# Patient Record
Sex: Male | Born: 1960 | Race: Black or African American | Hispanic: No | Marital: Single | Smoking: Current some day smoker
Health system: Southern US, Community
[De-identification: ages and names within clinical notes are randomized; demographics above are authoritative.]

## PROBLEM LIST (undated history)

## (undated) DIAGNOSIS — B20 Human immunodeficiency virus [HIV] disease: Secondary | ICD-10-CM

## (undated) DIAGNOSIS — I252 Old myocardial infarction: Secondary | ICD-10-CM

## (undated) DIAGNOSIS — E7800 Pure hypercholesterolemia, unspecified: Secondary | ICD-10-CM

## (undated) DIAGNOSIS — I209 Angina pectoris, unspecified: Secondary | ICD-10-CM

## (undated) DIAGNOSIS — F419 Anxiety disorder, unspecified: Secondary | ICD-10-CM

## (undated) DIAGNOSIS — F32A Depression, unspecified: Secondary | ICD-10-CM

## (undated) DIAGNOSIS — I10 Essential (primary) hypertension: Secondary | ICD-10-CM

## (undated) DIAGNOSIS — L729 Follicular cyst of the skin and subcutaneous tissue, unspecified: Secondary | ICD-10-CM

## (undated) DIAGNOSIS — C801 Malignant (primary) neoplasm, unspecified: Secondary | ICD-10-CM

## (undated) DIAGNOSIS — F329 Major depressive disorder, single episode, unspecified: Secondary | ICD-10-CM

## (undated) HISTORY — PX: OTHER SURGICAL HISTORY: SHX169

---

## 2015-01-27 ENCOUNTER — Encounter (HOSPITAL_COMMUNITY): Payer: Self-pay | Admitting: Emergency Medicine

## 2015-01-27 ENCOUNTER — Emergency Department (HOSPITAL_COMMUNITY)
Admission: EM | Admit: 2015-01-27 | Discharge: 2015-01-28 | Disposition: A | Discharge status: Other Acute Inpatient Hospital | Payer: Medicare Other | Attending: Emergency Medicine | Admitting: Emergency Medicine

## 2015-01-27 DIAGNOSIS — B20 Human immunodeficiency virus [HIV] disease: Secondary | ICD-10-CM | POA: Diagnosis not present

## 2015-01-27 DIAGNOSIS — Z79899 Other long term (current) drug therapy: Secondary | ICD-10-CM | POA: Insufficient documentation

## 2015-01-27 DIAGNOSIS — E876 Hypokalemia: Secondary | ICD-10-CM | POA: Diagnosis not present

## 2015-01-27 DIAGNOSIS — R45851 Suicidal ideations: Secondary | ICD-10-CM

## 2015-01-27 DIAGNOSIS — F1210 Cannabis abuse, uncomplicated: Secondary | ICD-10-CM | POA: Diagnosis not present

## 2015-01-27 DIAGNOSIS — N5089 Other specified disorders of the male genital organs: Secondary | ICD-10-CM | POA: Insufficient documentation

## 2015-01-27 DIAGNOSIS — F329 Major depressive disorder, single episode, unspecified: Secondary | ICD-10-CM | POA: Insufficient documentation

## 2015-01-27 DIAGNOSIS — F1410 Cocaine abuse, uncomplicated: Secondary | ICD-10-CM | POA: Diagnosis not present

## 2015-01-27 DIAGNOSIS — Z859 Personal history of malignant neoplasm, unspecified: Secondary | ICD-10-CM | POA: Insufficient documentation

## 2015-01-27 DIAGNOSIS — Z872 Personal history of diseases of the skin and subcutaneous tissue: Secondary | ICD-10-CM | POA: Diagnosis not present

## 2015-01-27 DIAGNOSIS — I10 Essential (primary) hypertension: Secondary | ICD-10-CM | POA: Insufficient documentation

## 2015-01-27 DIAGNOSIS — F32A Depression, unspecified: Secondary | ICD-10-CM

## 2015-01-27 DIAGNOSIS — F332 Major depressive disorder, recurrent severe without psychotic features: Secondary | ICD-10-CM | POA: Diagnosis present

## 2015-01-27 HISTORY — DX: Angina pectoris, unspecified: I20.9

## 2015-01-27 HISTORY — DX: Human immunodeficiency virus (HIV) disease: B20

## 2015-01-27 HISTORY — DX: Essential (primary) hypertension: I10

## 2015-01-27 HISTORY — DX: Follicular cyst of the skin and subcutaneous tissue, unspecified: L72.9

## 2015-01-27 HISTORY — DX: Malignant (primary) neoplasm, unspecified: C80.1

## 2015-01-27 LAB — COMPREHENSIVE METABOLIC PANEL
ALT: 13 U/L — AB (ref 17–63)
AST: 26 U/L (ref 15–41)
Albumin: 4.1 g/dL (ref 3.5–5.0)
Alkaline Phosphatase: 114 U/L (ref 38–126)
Anion gap: 6 (ref 5–15)
BUN: 25 mg/dL — AB (ref 6–20)
CHLORIDE: 107 mmol/L (ref 101–111)
CO2: 25 mmol/L (ref 22–32)
CREATININE: 1.13 mg/dL (ref 0.61–1.24)
Calcium: 8.9 mg/dL (ref 8.9–10.3)
GFR calc Af Amer: >60 mL/min (ref >60)
Glucose, Bld: 155 mg/dL — ABNORMAL HIGH (ref 65–99)
POTASSIUM: 3.1 mmol/L — AB (ref 3.5–5.1)
SODIUM: 138 mmol/L (ref 135–145)
Total Bilirubin: 0.5 mg/dL (ref 0.3–1.2)
Total Protein: 7.5 g/dL (ref 6.5–8.1)

## 2015-01-27 LAB — CBC
HCT: 41.4 % (ref 39.0–52.0)
HEMOGLOBIN: 14.2 g/dL (ref 13.0–17.0)
MCH: 33.4 pg (ref 26.0–34.0)
MCHC: 34.3 g/dL (ref 30.0–36.0)
MCV: 97.4 fL (ref 78.0–100.0)
PLATELETS: 175 10*3/uL (ref 150–400)
RBC: 4.25 MIL/uL (ref 4.22–5.81)
RDW: 11.7 % (ref 11.5–15.5)
WBC: 6.8 10*3/uL (ref 4.0–10.5)

## 2015-01-27 LAB — ETHANOL

## 2015-01-27 LAB — ACETAMINOPHEN LEVEL: Acetaminophen (Tylenol), Serum: <10 ug/mL — ABNORMAL LOW (ref 10–30)

## 2015-01-27 LAB — SALICYLATE LEVEL

## 2015-01-27 MED ORDER — IBUPROFEN 200 MG PO TABS
600.0000 mg | ORAL_TABLET | Freq: Three times a day (TID) | ORAL | Status: DC | PRN
Start: 2015-01-27 — End: 2015-01-28

## 2015-01-27 MED ORDER — ALUM & MAG HYDROXIDE-SIMETH 200-200-20 MG/5ML PO SUSP: 30.0000 mL | ORAL | Status: DC | PRN

## 2015-01-27 MED ORDER — LISINOPRIL 10 MG PO TABS
10.0000 mg | ORAL_TABLET | Freq: Every day | ORAL | Status: DC
Start: 2015-01-28 — End: 2015-01-28
  Administered 2015-01-28: 10 mg via ORAL
  Filled 2015-01-27 (×2): qty 1

## 2015-01-27 MED ORDER — ACETAMINOPHEN 325 MG PO TABS: 650.0000 mg | ORAL_TABLET | ORAL | Status: DC | PRN

## 2015-01-27 MED ORDER — AMLODIPINE BESYLATE 10 MG PO TABS
10.0000 mg | ORAL_TABLET | Freq: Every day | ORAL | Status: DC
  Administered 2015-01-28: 10 mg via ORAL
  Filled 2015-01-27 (×2): qty 1

## 2015-01-27 MED ORDER — NITROGLYCERIN 0.4 MG SL SUBL: 0.4000 mg | SUBLINGUAL_TABLET | SUBLINGUAL | Status: DC | PRN

## 2015-01-27 MED ORDER — NICOTINE 21 MG/24HR TD PT24
21.0000 mg | MEDICATED_PATCH | Freq: Every day | TRANSDERMAL | Status: DC
  Filled 2015-01-27: qty 1

## 2015-01-27 MED ORDER — ROSUVASTATIN CALCIUM 20 MG PO TABS
20.0000 mg | ORAL_TABLET | Freq: Every day | ORAL | Status: DC
  Filled 2015-01-27 (×2): qty 1

## 2015-01-27 MED ORDER — LORAZEPAM 1 MG PO TABS: 1.0000 mg | ORAL_TABLET | Freq: Three times a day (TID) | ORAL | Status: DC | PRN

## 2015-01-27 MED ORDER — CARVEDILOL 25 MG PO TABS
25.0000 mg | ORAL_TABLET | Freq: Two times a day (BID) | ORAL | Status: DC
  Administered 2015-01-28: 25 mg via ORAL
  Filled 2015-01-27 (×5): qty 1

## 2015-01-27 MED ORDER — EFAVIRENZ-EMTRICITAB-TENOFOVIR 600-200-300 MG PO TABS
1.0000 | ORAL_TABLET | Freq: Every day | ORAL | Status: DC
  Administered 2015-01-27: 1 via ORAL
  Filled 2015-01-27 (×3): qty 1

## 2015-01-27 MED ORDER — ZOLPIDEM TARTRATE 5 MG PO TABS: 5.0000 mg | ORAL_TABLET | Freq: Every evening | ORAL | Status: DC | PRN

## 2015-01-27 MED ORDER — ONDANSETRON HCL 4 MG PO TABS: 4.0000 mg | ORAL_TABLET | Freq: Three times a day (TID) | ORAL | Status: DC | PRN

## 2015-01-27 MED ORDER — PANTOPRAZOLE SODIUM 40 MG PO TBEC
40.0000 mg | DELAYED_RELEASE_TABLET | Freq: Every day | ORAL | Status: DC
  Administered 2015-01-28: 40 mg via ORAL
  Filled 2015-01-27: qty 1

## 2015-01-27 NOTE — ED Notes (Signed)
Pt states he is feeling useless and feels like his life is not worth very much  Pt states he feels like life is just too much right now and is making him feel crazy  Pt states he has hx of depression but has not had to be on any medications in a long time

## 2015-01-27 NOTE — ED Provider Notes (Signed)
CSN: [id number]     Arrival date & time 01/27/15  2151 History   First MD Initiated Contact with Patient 01/27/15 2238     Chief Complaint  Patient presents with  . Suicidal     (Consider location/radiation/quality/duration/timing/severity/associated sxs/prior Treatment) HPI Comments: Nathan Santana is a 54 y.o. male with a PMHx of depression, HIV, scrotal cysts s/p excision, HTN, and angina pectoris, who presents to the ED with complaints of anhedonia, depression, and suicidal ideations 1 week. Patient states that he has been feeling like there is nothing left to live for, but he has no specific plan in mind for suicide. He states that today he went to a urology appointment at [facility] and was told there was nothing more to do about his scrotal cyst which isn't causing him discomfort for the last 1 month. He reports the scrotal pain is chronic and unchanged, and the swelling in the cyst is unchanged. After his urology appointment, he states that he was so upset due to the fact that they were not going to do anything for his condition, that he became more suicidal and sought attention by coming to the ER today. He is here voluntarily.  He denies any HI/AVH, or suicidal plan. He denies any illicit drug use, alcohol use, or having a therapist. He is no longer on any psychiatric medications, but previously was on Paxil. He admits to smoking 1 cigarette per day. He denies any other medical complaints aside from the chronic scrotal pain and swelling for which she was are evaluated by a urologist today.  Patient is a 54 y.o. male presenting with mental health disorder. The history is provided by the patient. No language interpreter was used.  Mental Health Problem Presenting symptoms: suicidal thoughts   Presenting symptoms: no hallucinations and no homicidal ideas   Onset quality:  Gradual Duration:  1 week Timing:  Constant Progression:  Unchanged Chronicity:  Recurrent Context: stressful life  event   Treatment compliance:  Untreated Relieved by:  None tried Exacerbated by: stress. Ineffective treatments:  None tried Associated symptoms: anhedonia   Associated symptoms: no abdominal pain and no chest pain   Risk factors: hx of mental illness     Past Medical History  Diagnosis Date  . HIV infection ([place])   . Cancer ([place])   . Hypertension   . Angina pectoris ([place])   . Scrotal cyst    Past Surgical History  Procedure Laterality Date  . Scrotal turmor removed     Family History  Problem Relation Age of Onset  . Hypertension Other    Social History  Substance Use Topics  . Smoking status: Never Smoker   . Smokeless tobacco: None  . Alcohol Use: No    Review of Systems  Constitutional: Negative for fever and chills.  Respiratory: Negative for shortness of breath.   Cardiovascular: Negative for chest pain.  Gastrointestinal: Negative for nausea, vomiting, abdominal pain, diarrhea and constipation.  Genitourinary: Positive for scrotal swelling (chronic pain/swelling at site of his scrotal cyst, seen by urologist today). Negative for dysuria and hematuria.  Musculoskeletal: Negative for myalgias and arthralgias.  Skin: Negative for color change.  Allergic/Immunologic: Positive for immunocompromised state (HIV).  Neurological: Negative for weakness and numbness.  Psychiatric/Behavioral: Positive for suicidal ideas. Negative for homicidal ideas, hallucinations and confusion.   10 Systems reviewed and are negative for acute change except as noted in the HPI.    Allergies  Review of patient's allergies indicates no known allergies.  Home Medications   Prior to Admission medications   Medication Sig Start Date End Date Taking? Authorizing Provider  amLODipine (NORVASC) 10 MG tablet Take 10 mg by mouth daily.  01/11/15  Yes Historical Provider, MD  ATRIPLA 600-200-300 MG tablet TK 1 T PO HS 01/11/15  Yes Historical Provider, MD  carvedilol (COREG) 25 MG tablet  Take 25 mg by mouth 2 (two) times daily with a meal.  01/11/15  Yes Historical Provider, MD  ibuprofen (ADVIL,MOTRIN) 800 MG tablet take 1 tablet by mouth every 8 hours if needed for pain 12/29/14  Yes Historical Provider, MD  lisinopril (PRINIVIL,ZESTRIL) 10 MG tablet Take 10 mg by mouth daily. 12/17/14  Yes Historical Provider, MD  pantoprazole (PROTONIX) 40 MG tablet Take 40 mg by mouth daily. 12/17/14  Yes Historical Provider, MD  rosuvastatin (CRESTOR) 20 MG tablet Take 20 mg by mouth daily.  01/11/15  Yes Historical Provider, MD  NITROSTAT 0.4 MG SL tablet Place 0.4 mg under the tongue every 5 (five) minutes as needed for chest pain.  01/11/15   Historical Provider, MD   BP 125/75 mmHg  Pulse 68  Temp(Src) 98.3 F (36.8 C) (Oral)  Resp 17  SpO2 99% Physical Exam  Constitutional: He is oriented to person, place, and time. Vital signs are normal. He appears well-developed and well-nourished.  Non-toxic appearance. No distress.  Afebrile, nontoxic, NAD  HENT:  Head: Normocephalic and atraumatic.  Mouth/Throat: Oropharynx is clear and moist and mucous membranes are normal.  Eyes: Conjunctivae and EOM are normal. Right eye exhibits no discharge. Left eye exhibits no discharge.  Neck: Normal range of motion. Neck supple.  Cardiovascular: Normal rate, regular rhythm, normal heart sounds and intact distal pulses.  Exam reveals no gallop and no friction rub.   No murmur heard. Pulmonary/Chest: Effort normal and breath sounds normal. No respiratory distress. He has no decreased breath sounds. He has no wheezes. He has no rhonchi. He has no rales.  Abdominal: Soft. Normal appearance and bowel sounds are normal. He exhibits no distension. There is no tenderness. There is no rigidity, no rebound, no guarding, no CVA tenderness, no tenderness at McBurney's point and negative Murphy's sign.  Musculoskeletal: Normal range of motion.  Neurological: He is alert and oriented to person, place, and time. He  has normal strength. No sensory deficit.  Skin: Skin is warm, dry and intact. No rash noted.  Psychiatric: He is not actively hallucinating. He exhibits a depressed mood. He expresses suicidal ideation. He expresses no homicidal ideation. He expresses no suicidal plans and no homicidal plans.  Depressed affect. Endorses SI without a specific plan. No HI/AVH  Nursing note and vitals reviewed.   ED Course  Procedures (including critical care time) Labs Review Labs Reviewed  COMPREHENSIVE METABOLIC PANEL - Abnormal; Notable for the following:    Potassium 3.1 (*)    Glucose, Bld 155 (*)    BUN 25 (*)    ALT 13 (*)    All other components within normal limits  ACETAMINOPHEN LEVEL - Abnormal; Notable for the following:    Acetaminophen (Tylenol), Serum <10 (*)    All other components within normal limits  ETHANOL  SALICYLATE LEVEL  CBC  URINE RAPID DRUG SCREEN, HOSP PERFORMED    Imaging Review No results found. I have personally reviewed and evaluated these images and lab results as part of my medical decision-making.   EKG Interpretation None      MDM   Final diagnoses:  Suicidal ideations  Depression  Hypokalemia    54 y.o. male here with suicidal ideations without a specific plan. He feels depressed and has had anhedonia for the last week. No HI/AVH. Reports chronic scrotal pain and a cyst that he has had, pain has been ongoing for more than a month, was seen by his urologist today at [facility] was told there is nothing more to do which he reports upset him so much that he felt much more suicidal and that is why he sought psychiatric attention. Will get screening labs, then likely TTS consult. Pt here voluntarily. Will reassess after labs.   12:08 AM CMP with mild hypokalemia, will give kdur now. EtOH neg. Salicylate and acetaminophen WNL. CBC unremarkable. UDS pending but doubt this will change his med clearance. Will consult TTS and place in psych hold. Meds reordered.  Please see [facility] notes for further documentation of care  BP 125/75 mmHg  Pulse 68  Temp(Src) 98.3 F (36.8 C) (Oral)  Resp 17  SpO2 99%  Meds ordered this encounter  Medications  . ATRIPLA 600-200-300 MG tablet    Sig: TK 1 T PO HS    Refill:  1  . ibuprofen (ADVIL,MOTRIN) 800 MG tablet    Sig: take 1 tablet by mouth every 8 hours if needed for pain    Refill:  0  . carvedilol (COREG) 25 MG tablet    Sig: Take 25 mg by mouth 2 (two) times daily with a meal.   . lisinopril (PRINIVIL,ZESTRIL) 10 MG tablet    Sig: Take 10 mg by mouth daily.    Refill:  0  . pantoprazole (PROTONIX) 40 MG tablet    Sig: Take 40 mg by mouth daily.    Refill:  0  . NITROSTAT 0.4 MG SL tablet    Sig: Place 0.4 mg under the tongue every 5 (five) minutes as needed for chest pain.   . rosuvastatin (CRESTOR) 20 MG tablet    Sig: Take 20 mg by mouth daily.   Marland Kitchen amLODipine (NORVASC) 10 MG tablet    Sig: Take 10 mg by mouth daily.   Marland Kitchen amLODipine (NORVASC) tablet 10 mg    Sig:   . efavirenz-emtricitabine-tenofovir (ATRIPLA) 600-200-300 MG per tablet 1 tablet    Sig:   . carvedilol (COREG) tablet 25 mg    Sig:   . lisinopril (PRINIVIL,ZESTRIL) tablet 10 mg    Sig:   . nitroGLYCERIN (NITROSTAT) SL tablet 0.4 mg    Sig:   . pantoprazole (PROTONIX) EC tablet 40 mg    Sig:   . rosuvastatin (CRESTOR) tablet 20 mg    Sig:   . alum & mag hydroxide-simeth (MAALOX/MYLANTA) 701-779-39 MG/5ML suspension 30 mL    Sig:   . ondansetron (ZOFRAN) tablet 4 mg    Sig:   . nicotine (NICODERM CQ - dosed in mg/24 hours) patch 21 mg    Sig:   . zolpidem (AMBIEN) tablet 5 mg    Sig:   . ibuprofen (ADVIL,MOTRIN) tablet 600 mg    Sig:   . acetaminophen (TYLENOL) tablet 650 mg    Sig:   . LORazepam (ATIVAN) tablet 1 mg    Sig:   . potassium chloride SA (K-DUR,KLOR-CON) CR tablet 40 mEq    Sig:      Eligah Anello Camprubi-Soms, PA-C 01/28/15 0010  Gareth Morgan, MD 01/28/15 1723

## 2015-01-27 NOTE — ED Notes (Signed)
PA at bedside.

## 2015-01-28 ENCOUNTER — Inpatient Hospital Stay (HOSPITAL_COMMUNITY)
Admission: AD | Admit: 2015-01-28 | Discharge: 2015-02-05 | DRG: 885 | Disposition: A | Discharge status: Home / Self Care | Payer: Medicare Other | Source: Intra-hospital | Attending: Emergency Medicine | Admitting: Emergency Medicine

## 2015-01-28 ENCOUNTER — Encounter (HOSPITAL_COMMUNITY): Payer: Self-pay | Admitting: *Deleted

## 2015-01-28 DIAGNOSIS — F332 Major depressive disorder, recurrent severe without psychotic features: Secondary | ICD-10-CM | POA: Diagnosis present

## 2015-01-28 DIAGNOSIS — F329 Major depressive disorder, single episode, unspecified: Secondary | ICD-10-CM | POA: Diagnosis not present

## 2015-01-28 DIAGNOSIS — Z79899 Other long term (current) drug therapy: Secondary | ICD-10-CM | POA: Diagnosis not present

## 2015-01-28 DIAGNOSIS — B20 Human immunodeficiency virus [HIV] disease: Secondary | ICD-10-CM | POA: Diagnosis present

## 2015-01-28 DIAGNOSIS — N50811 Right testicular pain: Secondary | ICD-10-CM | POA: Diagnosis not present

## 2015-01-28 DIAGNOSIS — R079 Chest pain, unspecified: Secondary | ICD-10-CM

## 2015-01-28 DIAGNOSIS — F17290 Nicotine dependence, other tobacco product, uncomplicated: Secondary | ICD-10-CM | POA: Diagnosis present

## 2015-01-28 DIAGNOSIS — R45851 Suicidal ideations: Secondary | ICD-10-CM | POA: Diagnosis present

## 2015-01-28 DIAGNOSIS — I10 Essential (primary) hypertension: Secondary | ICD-10-CM | POA: Diagnosis present

## 2015-01-28 DIAGNOSIS — I4510 Unspecified right bundle-branch block: Secondary | ICD-10-CM | POA: Diagnosis not present

## 2015-01-28 DIAGNOSIS — Z8249 Family history of ischemic heart disease and other diseases of the circulatory system: Secondary | ICD-10-CM | POA: Diagnosis not present

## 2015-01-28 DIAGNOSIS — Z590 Homelessness: Secondary | ICD-10-CM | POA: Diagnosis not present

## 2015-01-28 DIAGNOSIS — F419 Anxiety disorder, unspecified: Secondary | ICD-10-CM | POA: Diagnosis not present

## 2015-01-28 DIAGNOSIS — Z7982 Long term (current) use of aspirin: Secondary | ICD-10-CM | POA: Diagnosis not present

## 2015-01-28 DIAGNOSIS — Z21 Asymptomatic human immunodeficiency virus [HIV] infection status: Secondary | ICD-10-CM | POA: Diagnosis not present

## 2015-01-28 DIAGNOSIS — I2510 Atherosclerotic heart disease of native coronary artery without angina pectoris: Secondary | ICD-10-CM | POA: Diagnosis not present

## 2015-01-28 DIAGNOSIS — I25119 Atherosclerotic heart disease of native coronary artery with unspecified angina pectoris: Secondary | ICD-10-CM | POA: Diagnosis not present

## 2015-01-28 HISTORY — DX: Major depressive disorder, single episode, unspecified: F32.9

## 2015-01-28 HISTORY — DX: Anxiety disorder, unspecified: F41.9

## 2015-01-28 HISTORY — DX: Depression, unspecified: F32.A

## 2015-01-28 LAB — RAPID URINE DRUG SCREEN, HOSP PERFORMED
AMPHETAMINES: NOT DETECTED
BARBITURATES: NOT DETECTED
Benzodiazepines: NOT DETECTED
Cocaine: POSITIVE — AB
Opiates: NOT DETECTED
TETRAHYDROCANNABINOL: POSITIVE — AB

## 2015-01-28 MED ORDER — PAROXETINE HCL 20 MG PO TABS
20.0000 mg | ORAL_TABLET | Freq: Every day | ORAL | Status: DC
  Administered 2015-01-29 – 2015-02-05 (×8): 20 mg via ORAL
  Filled 2015-01-28 (×11): qty 1

## 2015-01-28 MED ORDER — LISINOPRIL 10 MG PO TABS
10.0000 mg | ORAL_TABLET | Freq: Every day | ORAL | Status: DC
  Administered 2015-01-29 – 2015-02-05 (×8): 10 mg via ORAL
  Filled 2015-01-28 (×11): qty 1

## 2015-01-28 MED ORDER — ACETAMINOPHEN 325 MG PO TABS
650.0000 mg | ORAL_TABLET | ORAL | Status: DC | PRN
  Administered 2015-02-01: 650 mg via ORAL
  Filled 2015-01-28: qty 2

## 2015-01-28 MED ORDER — ROSUVASTATIN CALCIUM 20 MG PO TABS
20.0000 mg | ORAL_TABLET | Freq: Every day | ORAL | Status: DC
  Administered 2015-01-29 – 2015-02-04 (×6): 20 mg via ORAL
  Filled 2015-01-28 (×10): qty 1

## 2015-01-28 MED ORDER — NICOTINE 21 MG/24HR TD PT24: 21.0000 mg | MEDICATED_PATCH | Freq: Every day | TRANSDERMAL | Status: DC

## 2015-01-28 MED ORDER — ONDANSETRON HCL 4 MG PO TABS: 4.0000 mg | ORAL_TABLET | Freq: Three times a day (TID) | ORAL | Status: DC | PRN

## 2015-01-28 MED ORDER — EFAVIRENZ-EMTRICITAB-TENOFOVIR 600-200-300 MG PO TABS
1.0000 | ORAL_TABLET | Freq: Every day | ORAL | Status: DC
  Administered 2015-01-28 – 2015-02-04 (×8): 1 via ORAL
  Filled 2015-01-28 (×12): qty 1

## 2015-01-28 MED ORDER — CARVEDILOL 25 MG PO TABS
25.0000 mg | ORAL_TABLET | Freq: Two times a day (BID) | ORAL | Status: DC
  Administered 2015-01-29 – 2015-02-05 (×14): 25 mg via ORAL
  Filled 2015-01-28 (×21): qty 1

## 2015-01-28 MED ORDER — AMLODIPINE BESYLATE 10 MG PO TABS
10.0000 mg | ORAL_TABLET | Freq: Every day | ORAL | Status: DC
  Administered 2015-01-29 – 2015-02-05 (×8): 10 mg via ORAL
  Filled 2015-01-28 (×11): qty 1

## 2015-01-28 MED ORDER — PAROXETINE HCL 20 MG PO TABS
20.0000 mg | ORAL_TABLET | Freq: Every day | ORAL | Status: DC
  Administered 2015-01-28: 20 mg via ORAL
  Filled 2015-01-28 (×3): qty 1

## 2015-01-28 MED ORDER — LORAZEPAM 1 MG PO TABS
1.0000 mg | ORAL_TABLET | Freq: Three times a day (TID) | ORAL | Status: DC | PRN
  Administered 2015-01-29 – 2015-02-04 (×7): 1 mg via ORAL
  Filled 2015-01-28 (×7): qty 1

## 2015-01-28 MED ORDER — ENSURE ENLIVE PO LIQD
237.0000 mL | Freq: Two times a day (BID) | ORAL | Status: DC
  Administered 2015-01-29 (×2): 237 mL via ORAL

## 2015-01-28 MED ORDER — IBUPROFEN 600 MG PO TABS
600.0000 mg | ORAL_TABLET | Freq: Three times a day (TID) | ORAL | Status: DC | PRN
  Administered 2015-01-30: 600 mg via ORAL
  Filled 2015-01-28: qty 1

## 2015-01-28 MED ORDER — POTASSIUM CHLORIDE CRYS ER 20 MEQ PO TBCR
40.0000 meq | EXTENDED_RELEASE_TABLET | Freq: Once | ORAL | Status: AC
  Administered 2015-01-28: 40 meq via ORAL
  Filled 2015-01-28: qty 2

## 2015-01-28 MED ORDER — NICOTINE 21 MG/24HR TD PT24
21.0000 mg | MEDICATED_PATCH | Freq: Every day | TRANSDERMAL | Status: DC
  Filled 2015-01-28 (×3): qty 1

## 2015-01-28 MED ORDER — PANTOPRAZOLE SODIUM 40 MG PO TBEC
40.0000 mg | DELAYED_RELEASE_TABLET | Freq: Every day | ORAL | Status: DC
  Administered 2015-01-29 – 2015-02-05 (×8): 40 mg via ORAL
  Filled 2015-01-28 (×11): qty 1

## 2015-01-28 MED ORDER — ZOLPIDEM TARTRATE 5 MG PO TABS
5.0000 mg | ORAL_TABLET | Freq: Every evening | ORAL | Status: DC | PRN
  Administered 2015-01-28 – 2015-02-04 (×8): 5 mg via ORAL
  Filled 2015-01-28 (×9): qty 1

## 2015-01-28 MED ORDER — ALUM & MAG HYDROXIDE-SIMETH 200-200-20 MG/5ML PO SUSP: 30.0000 mL | ORAL | Status: DC | PRN

## 2015-01-28 MED ORDER — NITROGLYCERIN 0.4 MG SL SUBL
0.4000 mg | SUBLINGUAL_TABLET | SUBLINGUAL | Status: DC | PRN
  Administered 2015-01-31 – 2015-02-04 (×2): 0.4 mg via SUBLINGUAL
  Filled 2015-01-28 (×2): qty 1

## 2015-01-28 NOTE — Progress Notes (Signed)
Pt did not attend the evening karaoke group, since he was new to the unit. Pt remained in his room.

## 2015-01-28 NOTE — ED Notes (Signed)
TTS in room with pt

## 2015-01-28 NOTE — Progress Notes (Signed)
pcp is [facility] [address] [place], [place] [address] [phone]

## 2015-01-28 NOTE — ED Notes (Signed)
Pt oriented to room and unit.  Pt cannot confirm or deny SI at this time however he does contract for safety.
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
## 2015-01-28 NOTE — Progress Notes (Signed)
D   Pt isolated to his room and did not attend groups this evening   He said he was tired and just not feeling like it   He endorses depression and hopelessness   And at this point doesn't know what to do A   Verbal support given   Medications administered and effectiveness monitored    Q 15 min checks   Discussed medications and importance of developing coping skills R   Pt is safe at present and receptive to verbal support

## 2015-01-28 NOTE — Tx Team (Signed)
Initial Interdisciplinary Treatment Plan   PATIENT STRESSORS: Financial difficulties Health problems Legal issue Marital or family conflict Occupational concerns Substance abuse   PATIENT STRENGTHS: Ability for insight Average or above average intelligence Capable of independent living Communication skills General fund of knowledge Motivation for treatment/growth Supportive family/friends   PROBLEM LIST: Problem List/Patient Goals Date to be addressed Date deferred Reason deferred Estimated date of resolution  "suicidal thoughts" 01/28/2015   D/c  "alcohol" 01/28/2015   D/c  "depression" 01/28/2015   D/c  "anxiety" 11/10.2016   D/c  "panic" 01/28/2015   D/c                           DISCHARGE CRITERIA:  Ability to meet basic life and health needs Adequate post-discharge living arrangements Improved stabilization in mood, thinking, and/or behavior Medical problems require only outpatient monitoring Motivation to continue treatment in a less acute level of care Need for constant or close observation no longer present Reduction of life-threatening or endangering symptoms to within safe limits Safe-care adequate arrangements made Verbal commitment to aftercare and medication compliance  PRELIMINARY DISCHARGE PLAN: Attend aftercare/continuing care group Attend PHP/IOP Attend 12-step recovery group Outpatient therapy Placement in alternative living arrangements  PATIENT/FAMIILY INVOLVEMENT: This treatment plan has been presented to and reviewed with the patient, Nathan Santana.  The patient and family have been given the opportunity to ask questions and make suggestions.  Nathan Santana 01/28/2015, 6:58 PM

## 2015-01-28 NOTE — BH Assessment (Signed)
Assessment completed. Consulted Patriciaann Clan, PA-C who recommends inpatient treatment. TTS to seek placement. Informed Charlann Lange, PA-C of recommendation.

## 2015-01-28 NOTE — ED Notes (Signed)
Bed: [facility] Expected date:  Expected time:  Means of arrival:  Comments: rm 29

## 2015-01-28 NOTE — BH Assessment (Signed)
[place] Assessment Progress Note  Per Corena Pilgrim, MD, this pt requires psychiatric hospitalization at this time.  Letitia Libra, RN, [facility] has assigned pt to [facility] Rm [id number].  Pt has signed Voluntary Admission and Consent for Treatment, as well as Consent to Release Information to no one, and signed forms have been faxed to [facility].  Pt's nurse, Nena Jordan, has been notified, and agrees to send original paperwork along with pt via [facility], and to call report to [phone].  Jalene Mullet, [place] Triage Specialist [phone]

## 2015-01-28 NOTE — BH Assessment (Addendum)
Tele Assessment Note   Nathan Santana is an 54 y.o. male Presenting to [facility] reporting suicidal ideations but denies having an active plan. Pt stated "I have been depressed for a while and it has gotten worse this past month". "I feel unbalanced". "I don't care if I live or if I die". Pt did not report any suicide attempts but shared that he has been hospitalized in the past. Pt denies any current mental treatment. Pt shared that he is dealing with multiple stressors such as health issues and starting over (recent breakup). Pt is reporting multiple depressive symptoms and reported that his sleep and appetite has been poor. PT denies HI and AVH at this time. PT reported that he drinks alcohol but did not report any illicit substance abuse. PT reported that he drinks alcohol 1-2 times a week but denies an illicit substance abuse. Pt did not report any psychical, sexual or emotional abuse.  Inpatient treatment is recommended.   Diagnosis: Major Depressive Disorder, Recurrent episode, Moderate. Alcohol Use Disorder   Past Medical History:  Past Medical History  Diagnosis Date  . HIV infection ([place])   . Cancer ([place])   . Hypertension   . Angina pectoris ([place])   . Scrotal cyst     Past Surgical History  Procedure Laterality Date  . Scrotal turmor removed      Family History:  Family History  Problem Relation Age of Onset  . Hypertension Other     Social History:  reports that he has never smoked. He does not have any smokeless tobacco history on file. He reports that he does not drink alcohol or use illicit drugs.  Additional Social History:  Alcohol / Drug Use History of alcohol / drug use?: Yes Longest period of sobriety (when/how long): 8 Substance #1 Name of Substance 1: Alcohol  1 - Age of First Use: pt unable to recall  1 - Amount (size/oz): 1-2 beers" 1 - Frequency:  1-2 weekly  1 - Duration: ongoing  1 - Last Use / Amount: 01-26-15  CIWA: CIWA-Ar BP: 125/75 mmHg Pulse  Rate: 68 COWS:    PATIENT STRENGTHS: (choose at least two) Average or above average intelligence Motivation for treatment/growth  Allergies: No Known Allergies  Home Medications:  (Not in a hospital admission)  OB/GYN Status:  No LMP for male patient.  General Assessment Data Location of Assessment: [facility] ED TTS Assessment: In system Is this a Tele or Face-to-Face Assessment?: Face-to-Face Is this an Initial Assessment or a Re-assessment for this encounter?: Initial Assessment Marital status: Single Living Arrangements:  (Homeless ) Can pt return to current living arrangement?: Yes Admission Status: Voluntary Is patient capable of signing voluntary admission?: Yes Referral Source: Self/Family/Friend Insurance type: Medicare     Crisis Care Plan Living Arrangements:  (Homeless ) Name of Psychiatrist: No provider reported Name of Therapist: No provider reported  Education Status Is patient currently in school?: No Current Grade: N/A Highest grade of school patient has completed: 12 Name of school: N/A Contact person: N/A  Risk to self with the past 6 months Suicidal Ideation: Yes-Currently Present Has patient been a risk to self within the past 6 months prior to admission? : No Suicidal Intent: No Has patient had any suicidal intent within the past 6 months prior to admission? : No Is patient at risk for suicide?: Yes Suicidal Plan?: No Has patient had any suicidal plan within the past 6 months prior to admission? : No Access to Means: No What  has been your use of drugs/alcohol within the last 12 months?: Pt reported that he drinks alcohol 1-2 times a week.  Previous Attempts/Gestures: No How many times?: 0 Other Self Harm Risks: Denies  Triggers for Past Attempts: None known Intentional Self Injurious Behavior: None Family Suicide History: No Recent stressful life event(s): Other (Comment) (Medical issues, recent seperation, ) Persecutory voices/beliefs?:  Yes Depression: Yes Depression Symptoms: Despondent, Fatigue, Feeling worthless/self pity, Feeling angry/irritable, Guilt, Loss of interest in usual pleasures, Insomnia, Isolating, Tearfulness Substance abuse history and/or treatment for substance abuse?: Yes Suicide prevention information given to non-admitted patients: Not applicable  Risk to Others within the past 6 months Homicidal Ideation: No Does patient have any lifetime risk of violence toward others beyond the six months prior to admission? : No Thoughts of Harm to Others: No Current Homicidal Intent: No Current Homicidal Plan: No Access to Homicidal Means: No Identified Victim: N/A History of harm to others?: No Assessment of Violence: On admission Violent Behavior Description: No violent behaviors observed.  Does patient have access to weapons?: No Criminal Charges Pending?: No Does patient have a court date: No Is patient on probation?: No  Psychosis Hallucinations: None noted Delusions: None noted  Mental Status Report Appearance/Hygiene: In scrubs Eye Contact: Fair Motor Activity: Freedom of movement Speech: Logical/coherent Level of Consciousness: Quiet/awake Mood: Depressed Affect: Appropriate to circumstance Anxiety Level: Minimal Thought Processes: Relevant, Coherent Judgement: Unimpaired Orientation: Appropriate for developmental age Obsessive Compulsive Thoughts/Behaviors: None  Cognitive Functioning Concentration: Fair Memory: Recent Intact, Remote Intact IQ: Average Insight: Fair Impulse Control: Fair Appetite: Fair Weight Loss: 0 Weight Gain: 0 Sleep: Decreased Total Hours of Sleep: 5 Vegetative Symptoms: Decreased grooming  ADLScreening [facility] Assessment Services) Patient's cognitive ability adequate to safely complete daily activities?: Yes Patient able to express need for assistance with ADLs?: Yes Independently performs ADLs?: Yes (appropriate for developmental age)  Prior Inpatient  Therapy Prior Inpatient Therapy: Yes Prior Therapy Dates: 2000's  Prior Therapy Facilty/Provider(s): [facility] Reason for Treatment: Depression   Prior Outpatient Therapy Prior Outpatient Therapy: No Does patient have an ACCT team?: No Does patient have Intensive In-House Services?  : No Does patient have [facility] services? : No Does patient have P4CC services?: No  ADL Screening (condition at time of admission) Patient's cognitive ability adequate to safely complete daily activities?: Yes Is the patient deaf or have difficulty hearing?: No Does the patient have difficulty seeing, even when wearing glasses/contacts?: No Does the patient have difficulty concentrating, remembering, or making decisions?: No Patient able to express need for assistance with ADLs?: Yes Does the patient have difficulty dressing or bathing?: No Independently performs ADLs?: Yes (appropriate for developmental age)       Abuse/Neglect Assessment (Assessment to be complete while patient is alone) Physical Abuse: Denies Verbal Abuse: Denies Sexual Abuse: Denies Exploitation of patient/patient's resources: Denies Self-Neglect: Denies     Regulatory affairs officer (For Healthcare) Does patient have an advance directive?: No Would patient like information on creating an advanced directive?: No - patient declined information    Additional Information 1:1 In Past 12 Months?: No CIRT Risk: No Elopement Risk: No Does patient have medical clearance?: No (Labs pending )     Disposition:  Disposition Initial Assessment Completed for this Encounter: Yes Disposition of Patient: Inpatient treatment program Type of inpatient treatment program: Adult  Augusto Deckman S 01/28/2015 1:41 AM

## 2015-01-29 ENCOUNTER — Encounter (HOSPITAL_COMMUNITY): Payer: Self-pay | Admitting: Psychiatry

## 2015-01-29 DIAGNOSIS — F332 Major depressive disorder, recurrent severe without psychotic features: Principal | ICD-10-CM

## 2015-01-29 DIAGNOSIS — R45851 Suicidal ideations: Secondary | ICD-10-CM

## 2015-01-29 MED ORDER — POTASSIUM CHLORIDE CRYS ER 20 MEQ PO TBCR
20.0000 meq | EXTENDED_RELEASE_TABLET | Freq: Two times a day (BID) | ORAL | Status: AC
  Administered 2015-01-29 – 2015-01-30 (×3): 20 meq via ORAL
  Filled 2015-01-29 (×3): qty 1

## 2015-01-29 MED ORDER — BOOST / RESOURCE BREEZE PO LIQD
1.0000 | Freq: Three times a day (TID) | ORAL | Status: DC
  Administered 2015-01-29 – 2015-02-02 (×13): 1 via ORAL
  Administered 2015-02-03: 22:00:00 via ORAL
  Administered 2015-02-03 – 2015-02-05 (×6): 1 via ORAL
  Filled 2015-01-29 (×31): qty 1

## 2015-01-29 MED ORDER — NICOTINE 21 MG/24HR TD PT24
21.0000 mg | MEDICATED_PATCH | Freq: Every day | TRANSDERMAL | Status: DC
  Filled 2015-01-29 (×10): qty 1

## 2015-01-29 NOTE — Progress Notes (Signed)
Initial Nutrition Assessment  DOCUMENTATION CODES:   Not applicable  INTERVENTION:  Boost Breeze po TID, each supplement provides 250 kcal and 9 grams of protein  NUTRITION DIAGNOSIS:   Inadequate oral intake related to social / environmental circumstances as evidenced by per patient/family report.  GOAL:   Patient will meet greater than or equal to 90% of their needs  MONITOR:   I & O's, PO intake, Labs, Supplement acceptance, Weight trends  REASON FOR ASSESSMENT:   Malnutrition Screening Tool    ASSESSMENT:   Patient is a 54 year old man, who presented to ED with symptoms of depression. He states " I have been feeling depressed for a while, not feeling right". In June/16, he had a surgery to remove a scrotal tumor, which was benign, but which caused significant pain and discomfort post op, causing him to be unable to work for a Couple of weeks. States " that's when things started going down hill. " He Also states that a recent Break up/ separation from his fiance in August/16. Yet another stressor is homelessness- patient states last time he had a regular place to stay was in July, since then he has been staying with different family members   Spoke with pt outside day room. Pt reports he wasn't eat much, ate once a day, sometimes not at all, dependent upon where he was. Per chart review, homelessness likely had a large impact on his intake, along with depression.  Pt admitted to being out of work and lacking $$$ to eat as well.  Pt reported living with other people and asking them for food was uncomfortable for him.  Nutrition-Focused physical exam completed. Findings are no fat depletion, no muscle depletion, and no edema.   Pt reported ~20# wt loss in 2 months, UBW of 200#, pt is currently 182.  RD explained the importance of consistent meals, covered possible supplements during stay. Pt agreed to try boost.  Follow for acceptance. Labs and Medications  reviewed.  Diet Order:  Diet Heart Room service appropriate?: Yes; Fluid consistency:: Thin  Skin:  Reviewed, no issues  Last BM:  01/28/2015  Height:   Ht Readings from Last 1 Encounters:  01/28/15 6' (1.829 m)    Weight:   Wt Readings from Last 1 Encounters:  01/28/15 182 lb (82.555 kg)    Ideal Body Weight:     BMI:  Body mass index is 24.68 kg/(m^2).  Estimated Nutritional Needs:   Kcal:  2000-2400 calories  Protein:  80 - 100 grams  Fluid:  >/= 2L  EDUCATION NEEDS:   No education needs identified at this time  Satira Anis. Nathan Isidore, MS, RD LDN After Hours/Weekend Pager [phone]
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
## 2015-01-29 NOTE — Progress Notes (Signed)
Patient ID: Nathan Santana, male   DOB: November 11, 1960, 54 y.o.   MRN: [id number]   D: Pt has been very flat and depressed on the unit today. Pt reported that he was worried and had a lot on his mind. Pt reported that he was homeless and that he does not know what things are going to look like for him once he is discharged. This Probation officer offered for patient to meet with a SW regarding his concerns, SW made aware. Pt reported that his depression was a 8, his hopelessness was a 8, and his anxiety was a 7. Pt reported that his goal for today was to think straight, and listen very carefully. Pt reported being negative SI/HI, no AH/VH noted. A: 15 min checks continued for patient safety. R: Pt safety maintained.
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
## 2015-01-29 NOTE — BHH Suicide Risk Assessment (Signed)
[facility] INPATIENT:  Family/Significant Other Suicide Prevention Education  Suicide Prevention Education:  Patient Refusal for Family/Significant Other Suicide Prevention Education: The patient Bharath Peek has refused to provide written consent for family/significant other to be provided Family/Significant Other Suicide Prevention Education during admission and/or prior to discharge.  Physician notified. SPE reviewed with patient and brochure provided. Patient encouraged to return to hospital if having suicidal thoughts, patient verbalized his/her understanding and has no further questions at this time.   Bo Mcclintock 01/29/2015, 3:24 PM

## 2015-01-29 NOTE — Progress Notes (Signed)
[facility] Group Notes:  (Nursing/MHT/Case Management/Adjunct)  Date:  01/29/2015  Time:  10:49 PM  Type of Therapy:  Psychoeducational Skills  Participation Level:  Active  Participation Quality:  Appropriate  Affect:  Blunted  Cognitive:  Appropriate  Insight:  Good  Engagement in Group:  Engaged  Modes of Intervention:  Education  Summary of Progress/Problems: The patient shared with the group that his day was "okay" overall. He explained that he has been thinking quite a bit about his history of drinking along with his anger issues. He accepts responsibility for drinking alcohol and admits that it was his choice, but their is a need to  Work on these issues. He states that he is in "no hurry to leave" the hospital. As a theme for the day, his relapse prevention will involve making the right decisions and addressing his issues.   Archie Balboa S 01/29/2015, 10:49 PM

## 2015-01-29 NOTE — Progress Notes (Signed)
Patient ID: Nathan Santana, male   DOB: 11-19-1960, 54 y.o.   MRN: [id number] PER STATE REGULATIONS 482.30  THIS CHART WAS REVIEWED FOR MEDICAL NECESSITY WITH RESPECT TO THE PATIENT'S ADMISSION/DURATION OF STAY.  NEXT REVIEW DATE:02/01/15  Roma Schanz, RN, BSN CASE MANAGER

## 2015-01-29 NOTE — Progress Notes (Signed)
Recreation Therapy Notes  Date: 01/29/15 Time: 930 Location: 300 Group Room  Group Topic: Stress Management  Goal Area(s) Addresses:  Patient will verbalize importance of using healthy stress management.  Patient will identify positive emotions associated with healthy stress management.   Intervention: Stress Management  Activity :  Guided Imagery.  LRT introduced the concept of guided imagery to the patients.  Patients were asked to follow along as LRT read the script to engage in the activity of guided imagery.  Education:  Stress Management, Discharge Planning.   Education Outcome: Acknowledges edcuation/In group clarification offered/Needs additional education  Clinical Observations/Feedback: Patient did not attend group.   Victorino Sparrow, LRT/CTRS         Victorino Sparrow A 01/29/2015 1:41 PM

## 2015-01-29 NOTE — BHH Group Notes (Signed)
[facility] LCSW Group Therapy 01/29/2015 1:15pm  Type of Therapy: Group Therapy- Feelings Around Relapse and Recovery  Participation Level: Minimal  Participation Quality:  Attentive  Affect:  Appropriate  Cognitive: Alert and Oriented   Insight:  Developing   Engagement in Therapy: Limited  Modes of Intervention: Clarification, Confrontation, Discussion, Education, Exploration, Limit-setting, Orientation, Problem-solving, Rapport Building, Art therapist, Socialization and Support  Summary of Progress/Problems: The topic for today was feelings about relapse. The group discussed what relapse prevention is to them and identified triggers that they are on the path to relapse. Members also processed their feeling towards relapse and were able to relate to common experiences. Group also discussed coping skills that can be used for relapse prevention.  Pt did not participate in group discussion but was attentive peers.   Therapeutic Modalities:   Cognitive Behavioral Therapy Solution-Focused Therapy Assertiveness Training Relapse Prevention Therapy    Norman Clay [id number] 01/29/2015 2:47 PM

## 2015-01-29 NOTE — BHH Group Notes (Signed)
[facility] LCSW Aftercare Discharge Planning Group Note  01/29/2015 8:45 AM  Participation Quality: Alert, Appropriate and Oriented  Mood/Affect: Appropriate  Depression Rating: 7  Anxiety Rating: 7  Thoughts of Suicide: Pt denies SI/HI  Will you contract for safety? Yes  Current AVH: Pt denies  Plan for Discharge/Comments: Pt attended discharge planning group and actively participated in group. CSW discussed suicide prevention education with the group and encouraged them to discuss discharge planning and any relevant barriers. Pt expresses that he is feeling "okay." He described having many stressors at this time with uncertain housing at DC.  Transportation Means: Pt reports access to transportation  Supports: No supports mentioned at this time  [name], [place] 01/29/2015 9:18 AM
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
## 2015-01-30 LAB — TSH: TSH: 1.365 u[IU]/mL (ref 0.350–4.500)

## 2015-01-30 NOTE — Progress Notes (Signed)
Patient ID: Nathan Santana, male   DOB: 1960-08-20, 54 y.o.   MRN: [id number]   D: Pt continues to be very flat and depressed on the unit. Pt continues to worry about placement after discharge. Pt has attended all groups and engaged in treatment. Pt reported that his depression was a 7, his hopelessness was a 0, and his anxiety was a 7. Pt reported that his goal for today was to jut listen. Pt reported being negative SI/HI, no AH/VH noted. A: 15 min checks continued for patient safety. R: Pt safety maintained.

## 2015-01-30 NOTE — Progress Notes (Signed)
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
## 2015-01-30 NOTE — BHH Group Notes (Signed)
[facility] Group Notes:  (Clinical Social Work)   01/16/2015     1:15-2:15PM  Summary of Progress/Problems:   In today's process group a decisional balance exercise was used to explore in depth the perceived benefits and costs of alcohol and drugs, as well as the  benefits and costs of replacing these with healthy coping skills.  Patients listed healthy and unhealthy coping techniques, determining with CSW guidance that unhealthy coping techniques work initially, but eventually become harmful.  Motivational Interviewing and the whiteboard were utilized for the exercises.  The patient expressed that the unhealthy coping he often uses is getting his self-worth from other people, not focusing enough on himself, and isolation.  He was very involved in the discussion.  Type of Therapy:  Group Therapy - Process   Participation Level:  Active  Participation Quality:  Attentive, Drowsy and Sharing  Affect:  Blunted  Cognitive:  Appropriate and Oriented  Insight:  Engaged  Engagement in Therapy:  Engaged  Modes of Intervention:  Education, Motivational Interviewing  Selmer Dominion, LCSW 01/30/2015, 3:41 PM

## 2015-01-30 NOTE — Progress Notes (Signed)
[facility] Group Notes:  (Nursing/MHT/Case Management/Adjunct)  Date:  01/30/2015  Time:  11:12 PM  Type of Therapy:  Psychoeducational Skills  Participation Level:  Minimal  Participation Quality:  Resistant  Affect:  Depressed and Flat  Cognitive:  Lacking  Insight:  None  Engagement in Group:  None  Modes of Intervention:  Education  Summary of Progress/Problems: The patient attended group but refused to share.   Archie Balboa S 01/30/2015, 11:12 PM

## 2015-01-31 ENCOUNTER — Other Ambulatory Visit: Payer: Self-pay

## 2015-01-31 MED ORDER — GUAIFENESIN ER 600 MG PO TB12
600.0000 mg | ORAL_TABLET | Freq: Two times a day (BID) | ORAL | Status: DC
  Administered 2015-01-31 – 2015-02-05 (×9): 600 mg via ORAL
  Filled 2015-01-31 (×12): qty 1

## 2015-01-31 MED ORDER — MELOXICAM 7.5 MG PO TABS
7.5000 mg | ORAL_TABLET | Freq: Every day | ORAL | Status: DC
  Filled 2015-01-31 (×2): qty 1

## 2015-01-31 MED ORDER — FLUTICASONE PROPIONATE 50 MCG/ACT NA SUSP
1.0000 | Freq: Two times a day (BID) | NASAL | Status: DC
  Administered 2015-01-31 – 2015-02-05 (×8): 1 via NASAL
  Filled 2015-01-31 (×2): qty 16

## 2015-01-31 MED ORDER — MELOXICAM 7.5 MG PO TABS
7.5000 mg | ORAL_TABLET | Freq: Every day | ORAL | Status: DC
  Administered 2015-01-31 – 2015-02-03 (×4): 7.5 mg via ORAL
  Filled 2015-01-31 (×8): qty 1

## 2015-01-31 NOTE — [facility] Group Notes (Signed)
[facility] Group Notes:  (Nursing/MHT/Case Management/Adjunct)  Date:  01/31/2015  Time:  12:30 PM  Type of Therapy:  Psychoeducational Skills  Participation Level:  Did Not Attend  Participation Quality:  Did Not Attend  Affect:  Did Not Attend  Cognitive:  Did Not Attend  Insight:  None  Engagement in Group:  Did Not Attend  Modes of Intervention:  Did Not Attend  Summary of Progress/Problems: Pt did not attend patient self inventory group.   Benancio Deeds Shanta 01/31/2015, 12:30 PM

## 2015-01-31 NOTE — Progress Notes (Signed)
Patient ID: Nathan Santana, male   DOB: 10/11/1960, 54 y.o.   MRN: [id number]   D: Pt continues to be very flat and depressed on the unit. Pt continues to worry about placement after discharge. Pt has attended all groups and engaged in treatment. Pt reported that his depression was a 8, his hopelessness was a 8, and his anxiety was a 7. Pt reported that his goal for today was to make choices to help not just make him feel good. Pt reported being negative SI/HI, no AH/VH noted. A: 15 min checks continued for patient safety. R: Pt safety maintained.

## 2015-01-31 NOTE — Progress Notes (Signed)
Pt with complaints of chest pain at this time. Pt vital signs taken 138/88 pulse 66. Pt reports that he has tightness in his chest every now and then and reports he takes a nitro and will feel better. Pt not diaphoretic and does not complain of any radiating pain. NP on-cal notified, orders received to administer nitro and to complete EKG. Pt did receive nitro and EKG completed. Pt did report relief from nitro, will continue to monitor at this time.

## 2015-01-31 NOTE — Progress Notes (Signed)
[facility] MD Progress Note  01/31/2015  Nathan Santana  MRN:  [id number] Subjective:  Pt states: "I'm feeling horrible today because I have a cold."  Objective: Pt seen and chart. Pt is alert/oriented x4, calm, cooperative, and appropriate. Pt denies current suicidal/homicidal ideation and psychosis and does not appear to be responding to internal stimuli. He reports feeling very sick today and sounds congested. Given supportive medications as below. Reports that his depression/anxiety are about the same."  Principal Problem: Major depressive disorder, recurrent, severe without psychotic features ([place]) Diagnosis:   Patient Active Problem List   Diagnosis Date Noted  . Major depressive disorder, recurrent, severe without psychotic features ([place]) [F33.2] 01/28/2015    Priority: High  . Severe recurrent major depression without psychotic features ([place]) [F33.2] 01/28/2015   Total Time spent with patient: 15 minutes  Past Medical History:  Past Medical History  Diagnosis Date  . HIV infection ([place])   . Cancer ([place])   . Hypertension   . Angina pectoris ([place])   . Scrotal cyst   . Anxiety   . Depression     Past Surgical History  Procedure Laterality Date  . Scrotal turmor removed     Family History:  Family History  Problem Relation Age of Onset  . Hypertension Other    Social History:  History  Alcohol Use  . 0.6 oz/week  . 1 Cans of beer per week    Comment: one beer weekly     History  Drug Use No    Comment: no drugs since 2008    Social History   Social History  . Marital Status: Single    Spouse Name: N/A  . Number of Children: N/A  . Years of Education: N/A   Social History Main Topics  . Smoking status: Current Some Day Smoker -- 0.50 packs/day    Types: Cigars  . Smokeless tobacco: None  . Alcohol Use: 0.6 oz/week    1 Cans of beer per week     Comment: one beer weekly  . Drug Use: No     Comment: no drugs since 2008  . Sexual Activity: No   Other  Topics Concern  . None   Social History Narrative   Additional Social History:    Pain Medications: advil Prescriptions: norvasc  atripla   coreg   lisinopril   nitrostat   protonix   crestor Over the Counter: advil History of alcohol / drug use?: Yes Longest period of sobriety (when/how long): since 2008 Negative Consequences of Use: Financial, Scientist, research (physical sciences), Personal relationships, Work / School Withdrawal Symptoms: Other (Comment) (anxiety, depression, panic ) Name of Substance 1: alcohol 1 - Age of First Use: not sure 1 - Amount (size/oz): 1 beer weekly 1 - Frequency: 1 beer weekly 1 - Duration: ongoing 1 - Last Use / Amount: last week                  Sleep: Good  Appetite:  Good  Current Medications: Current Facility-Administered Medications  Medication Dose Route Frequency Provider Last Rate Last Dose  . acetaminophen (TYLENOL) tablet 650 mg  650 mg Oral Q4H PRN Delfin Gant, NP      . alum & mag hydroxide-simeth (MAALOX/MYLANTA) 200-200-20 MG/5ML suspension 30 mL  30 mL Oral PRN Delfin Gant, NP      . amLODipine (NORVASC) tablet 10 mg  10 mg Oral Daily Delfin Gant, NP   10 mg at 01/31/15 1056  . carvedilol (COREG)  tablet 25 mg  25 mg Oral BID WC Delfin Gant, NP   25 mg at 01/31/15 1056  . efavirenz-emtricitabine-tenofovir (ATRIPLA) 600-200-300 MG per tablet 1 tablet  1 tablet Oral QHS Delfin Gant, NP   1 tablet at 01/30/15 2142  . feeding supplement (BOOST / RESOURCE BREEZE) liquid 1 Container  1 Container Oral TID BM [place], RD   1 Container at 01/31/15 1253  . fluticasone (FLONASE) 50 MCG/ACT nasal spray 1 spray  1 spray Each Nare BID Benjamine Mola, FNP      . guaiFENesin (MUCINEX) 12 hr tablet 600 mg  600 mg Oral BID Benjamine Mola, FNP   600 mg at 01/31/15 1253  . ibuprofen (ADVIL,MOTRIN) tablet 600 mg  600 mg Oral Q8H PRN Delfin Gant, NP   600 mg at 01/30/15 0846  . lisinopril (PRINIVIL,ZESTRIL) tablet 10 mg  10 mg  Oral Daily Delfin Gant, NP   10 mg at 01/31/15 1056  . LORazepam (ATIVAN) tablet 1 mg  1 mg Oral Q8H PRN Delfin Gant, NP   1 mg at 01/29/15 2213  . nicotine (NICODERM CQ - dosed in mg/24 hours) patch 21 mg  21 mg Transdermal Daily Jenne Campus, MD   21 mg at 01/30/15 0843  . nitroGLYCERIN (NITROSTAT) SL tablet 0.4 mg  0.4 mg Sublingual Q5 min PRN Delfin Gant, NP      . ondansetron (ZOFRAN) tablet 4 mg  4 mg Oral Q8H PRN Delfin Gant, NP      . pantoprazole (PROTONIX) EC tablet 40 mg  40 mg Oral Daily Delfin Gant, NP   40 mg at 01/31/15 1056  . PARoxetine (PAXIL) tablet 20 mg  20 mg Oral Daily Delfin Gant, NP   20 mg at 01/31/15 1056  . rosuvastatin (CRESTOR) tablet 20 mg  20 mg Oral q1800 Delfin Gant, NP   20 mg at 01/30/15 1628  . zolpidem (AMBIEN) tablet 5 mg  5 mg Oral QHS PRN Delfin Gant, NP   5 mg at 01/30/15 2142    Lab Results:  Results for orders placed or performed during the hospital encounter of 01/28/15 (from the past 48 hour(s))  TSH     Status: None   Collection Time: 01/30/15  6:23 AM  Result Value Ref Range   TSH 1.365 0.350 - 4.500 uIU/mL    Comment: Performed at [facility]    Physical Findings: AIMS: Facial and Oral Movements Muscles of Facial Expression: None, normal Lips and Perioral Area: None, normal Jaw: None, normal Tongue: None, normal,Extremity Movements Upper (arms, wrists, hands, fingers): None, normal Lower (legs, knees, ankles, toes): None, normal, Trunk Movements Neck, shoulders, hips: None, normal, Overall Severity Severity of abnormal movements (highest score from questions above): None, normal Incapacitation due to abnormal movements: None, normal Patient's awareness of abnormal movements (rate only patient's report): No Awareness, Dental Status Current problems with teeth and/or dentures?: No Does patient usually wear dentures?: No  CIWA:  CIWA-Ar Total: 3 COWS:  COWS  Total Score: 2  Musculoskeletal: Strength & Muscle Tone: within normal limits Gait & Station: normal Patient leans: N/A  Psychiatric Specialty Exam: Review of Systems  Psychiatric/Behavioral: Positive for depression. Negative for suicidal ideas. The patient is nervous/anxious and has insomnia.   All other systems reviewed and are negative.   Blood pressure 129/89, pulse 69, temperature 98.7 F (37.1 C), temperature source Oral, resp. rate 18, height  6' (1.829 m), weight 82.555 kg (182 lb), SpO2 100 %.Body mass index is 24.68 kg/(m^2).  General Appearance: Casual and Fairly Groomed  Engineer, water::  Good  Speech:  Clear and Coherent and Normal Rate  Volume:  Normal  Mood:  Anxious and Depressed  Affect:  Appropriate, Congruent and Depressed  Thought Process:  Coherent and Goal Directed  Orientation:  Full (Time, Place, and Person)  Thought Content:  WDL  Suicidal Thoughts:  No  Homicidal Thoughts:  No  Memory:  Immediate;   Fair Recent;   Fair Remote;   Fair  Judgement:  Fair  Insight:  Good  Psychomotor Activity:  Normal  Concentration:  Fair  Recall:  [employer] [place]  Language: Fair  Akathisia:  No  Handed:    AIMS (if indicated):     Assets:  Communication Skills Desire for Improvement Resilience Social Support  ADL's:  Intact  Cognition: WNL  Sleep:  Number of Hours: 3.75   Treatment Plan Summary: Daily contact with patient to assess and evaluate symptoms and progress in treatment and Medication management  Medications:  -Continue nicotine patch  -continue Paxil 20mg  daily for MDD  -Continue Ambien 5mg  qhs prn insomnia  -Continue cardiac and HIV meds as given outpatient and on chart at 01/31/2015 and 12:58 PM  -Mucinex 600mg  bid for congestion  -Flonase nasal spray 80mcg bid bilaterally   -Mobic 7.5mg  for chronic pain   Benjamine Mola, FNP-BC 01/31/2015, 10:45PM

## 2015-01-31 NOTE — [facility] Group Notes (Signed)
[facility] Group Notes:  (Clinical Social Work)  01/31/2015  1:15-2:15pm  Summary of Progress/Problems:   The main focus of today's process group was to   1)  discuss the importance of adding supports  2)  define health supports versus unhealthy supports  3)  identify the patient's current unhealthy supports and plan how to handle them  4)  Identify the patient's current healthy supports and plan what to add.  An emphasis was placed on using counselor, doctor, therapy groups, 12-step groups, and problem-specific support groups to expand supports.    The patient expressed full comprehension of the concepts presented, and agreed that there is a need to add more supports.  The patient stated he himself can be a healthy or an unhealthy support, depending on the choices he makes.  He talked at some length about his father killing his mother, and how that has made siblings by the same mother but different fathers view him differently and not be willing to be supportive.  Type of Therapy:  Process Group with Motivational Interviewing  Participation Level:  Active  Participation Quality:  Attentive, Sharing and Supportive  Affect:  Appropriate  Cognitive:  Alert, Appropriate and Oriented  Insight:  Engaged  Engagement in Therapy:  Engaged  Modes of Intervention:   Education, Support and Processing, Activity  Selmer Dominion, LCSW 01/31/2015

## 2015-02-01 LAB — HEMOGLOBIN A1C
Hgb A1c MFr Bld: 5.3 % (ref 4.8–5.6)
Mean Plasma Glucose: 105 mg/dL

## 2015-02-01 NOTE — Progress Notes (Signed)
D:  Patient's self inventory sheet, patient has poor sleep, sleep medication was not helpful.  Poor appetite, low energy level, poor concentration.  Rated depression, hopeless and anxiety 10.  Denied withdrawals.  Denied SI.  Denied physical problems.  Stated he has had congestion/sore throat.  Goal is to accept life for what it is.  Stated he has been misused a lot, does not understand why.  Stated he feels better physically and mentally.  Plans to call his brother and ask for gas money.  Patient realizes that he will get back on his feet in a matter of time. A:  Medications administered per MD orders.  Emotional support and encouragement given patient. R:  Denied SI and HI, contracts for safety.  Denied A/V hallucinations.  Safety maintained with 15 minute checks.

## 2015-02-01 NOTE — Plan of Care (Signed)
Problem: Consults Goal: Depression Patient Education See Patient Education Module for education specifics.  Outcome: Progressing Nurse discussed depression/coping skills with patient.        

## 2015-02-01 NOTE — Progress Notes (Signed)
Patient ID: Nathan Santana, male   DOB: 1961/01/03, 54 y.o.   MRN: [id number] PER STATE REGULATIONS 482.30  THIS CHART WAS REVIEWED FOR MEDICAL NECESSITY WITH RESPECT TO THE PATIENT'S ADMISSION/ DURATION OF STAY.  NEXT REVIEW DATE: 02/05/2015  Chauncy Lean, RN, BSN CASE MANAGER

## 2015-02-01 NOTE — Progress Notes (Signed)
Patient is resting in bed with eyes closed. Respirations are even and non labored. No distress noted. Q 15 minute checks in progress and monitoring continues.

## 2015-02-01 NOTE — [facility] Group Notes (Signed)
[facility] LCSW Group Therapy  02/01/2015 1:15pm  Type of Therapy:  Group Therapy vercoming Obstacles  Pt did not attend, declined invitation.   [name], [place] 02/01/2015 3:41 PM

## 2015-02-01 NOTE — Progress Notes (Signed)
[facility] Group Notes:  (Nursing/MHT/Case Management/Adjunct)  Date:  02/01/2015  Time:  12:08 AM  Type of Therapy:  Psychoeducational Skills  Participation Level:  Active  Participation Quality:  Appropriate  Affect:  Depressed  Cognitive:  Appropriate  Insight:  Appropriate  Engagement in Group:  Engaged  Modes of Intervention:  Education  Summary of Progress/Problems: Patient shared with the group that he had a better day today. He continues to complain of throat pain and a headache, but acknowledges that the symptoms are beginning to improve. He continues to express his concern about his discharge plans (housing). The person was unable to state who his support system will consist of (theme of the day).   Maybelle Depaoli S 02/01/2015, 12:08 AM

## 2015-02-01 NOTE — Progress Notes (Signed)
Patient ID: Gabino Swailes, male   DOB: [birth year], 54 y.o.   MRN: [id number] [facility] MD Progress Note  02/01/2015  Hirving Ellenbecker  MRN:  [id number] Subjective:  Patient states he is " a little bit better " but still depressed, and states he has been upset and ruminative about  His homelessness, and what options he may have to go to after discharge. He also reports feeling rejected by Ex Significant Other, with whom he spoke on the phone, to find out her daughter and her planned to spend thanksgiving with someone else " even though I basically raised that girl and a lot of things they have at home I got for them".  Denies medication side effects. Reports slight sore throat/ odinophagia, which he attributes to " having a cold "   Objective:  Patient case discussed with treatment team and patient seen . He presents depressed, sad, constricted  In affect, ruminative about stressors as above . Responds to support, empathy, and affect improved partially during session. Denies cravings and states he plans to work on sobriety / abstinence ( he had relapsed after a long period of sobriety prior to admission)  No disruptive or agitated behaviors on unit, tends to isolate in room, but has gone to some groups. As per staff, patient has continued to report significant depression, sadness .  Denies any suicidal ideations at present, contracts for safety on the unit .   Denies medication side effects.   Principal Problem: Major depressive disorder, recurrent, severe without psychotic features ([place]) Diagnosis:   Patient Active Problem List   Diagnosis Date Noted  . Severe recurrent major depression without psychotic features ([place]) [F33.2] 01/28/2015  . Major depressive disorder, recurrent, severe without psychotic features ([place]) [F33.2] 01/28/2015   Total Time spent with patient: 20 minutes  Past Medical History:  Past Medical History  Diagnosis Date  . HIV infection ([place])   . Cancer ([place])   .  Hypertension   . Angina pectoris ([place])   . Scrotal cyst   . Anxiety   . Depression     Past Surgical History  Procedure Laterality Date  . Scrotal turmor removed     Family History:  Family History  Problem Relation Age of Onset  . Hypertension Other    Social History:  History  Alcohol Use  . 0.6 oz/week  . 1 Cans of beer per week    Comment: one beer weekly     History  Drug Use No    Comment: no drugs since 2008    Social History   Social History  . Marital Status: Single    Spouse Name: N/A  . Number of Children: N/A  . Years of Education: N/A   Social History Main Topics  . Smoking status: Current Some Day Smoker -- 0.50 packs/day    Types: Cigars  . Smokeless tobacco: None  . Alcohol Use: 0.6 oz/week    1 Cans of beer per week     Comment: one beer weekly  . Drug Use: No     Comment: no drugs since 2008  . Sexual Activity: No   Other Topics Concern  . None   Social History Narrative   Additional Social History:    Pain Medications: advil Prescriptions: norvasc  atripla   coreg   lisinopril   nitrostat   protonix   crestor Over the Counter: advil History of alcohol / drug use?: Yes Longest period of sobriety (when/how long): since 2008 Negative Consequences  of Use: Financial, Scientist, research (physical sciences), Personal relationships, Work / School Withdrawal Symptoms: Other (Comment) (anxiety, depression, panic ) Name of Substance 1: alcohol 1 - Age of First Use: not sure 1 - Amount (size/oz): 1 beer weekly 1 - Frequency: 1 beer weekly 1 - Duration: ongoing 1 - Last Use / Amount: last week  Sleep: Good  Appetite:  Good  Current Medications: Current Facility-Administered Medications  Medication Dose Route Frequency Provider Last Rate Last Dose  . acetaminophen (TYLENOL) tablet 650 mg  650 mg Oral Q4H PRN Delfin Gant, NP   650 mg at 02/01/15 0846  . alum & mag hydroxide-simeth (MAALOX/MYLANTA) 200-200-20 MG/5ML suspension 30 mL  30 mL Oral PRN Delfin Gant, NP      . amLODipine (NORVASC) tablet 10 mg  10 mg Oral Daily Delfin Gant, NP   10 mg at 02/01/15 0839  . carvedilol (COREG) tablet 25 mg  25 mg Oral BID WC Delfin Gant, NP   25 mg at 02/01/15 0840  . efavirenz-emtricitabine-tenofovir (ATRIPLA) 600-200-300 MG per tablet 1 tablet  1 tablet Oral QHS Delfin Gant, NP   1 tablet at 01/31/15 2113  . feeding supplement (BOOST / RESOURCE BREEZE) liquid 1 Container  1 Container Oral TID BM [place], RD   1 Container at 02/01/15 1211  . fluticasone (FLONASE) 50 MCG/ACT nasal spray 1 spray  1 spray Each Nare BID Benjamine Mola, FNP   1 spray at 02/01/15 [phone]  . guaiFENesin (MUCINEX) 12 hr tablet 600 mg  600 mg Oral BID Benjamine Mola, FNP   600 mg at 02/01/15 [id number]  . lisinopril (PRINIVIL,ZESTRIL) tablet 10 mg  10 mg Oral Daily Delfin Gant, NP   10 mg at 02/01/15 0839  . LORazepam (ATIVAN) tablet 1 mg  1 mg Oral Q8H PRN Delfin Gant, NP   1 mg at 02/01/15 0847  . meloxicam (MOBIC) tablet 7.5 mg  7.5 mg Oral Daily Myer Peer Cobos, MD   7.5 mg at 02/01/15 0839  . nicotine (NICODERM CQ - dosed in mg/24 hours) patch 21 mg  21 mg Transdermal Daily Jenne Campus, MD   21 mg at 01/30/15 0843  . nitroGLYCERIN (NITROSTAT) SL tablet 0.4 mg  0.4 mg Sublingual Q5 min PRN Delfin Gant, NP   0.4 mg at 01/31/15 1923  . ondansetron (ZOFRAN) tablet 4 mg  4 mg Oral Q8H PRN Delfin Gant, NP      . pantoprazole (PROTONIX) EC tablet 40 mg  40 mg Oral Daily Delfin Gant, NP   40 mg at 02/01/15 0839  . PARoxetine (PAXIL) tablet 20 mg  20 mg Oral Daily Delfin Gant, NP   20 mg at 02/01/15 0839  . rosuvastatin (CRESTOR) tablet 20 mg  20 mg Oral q1800 Delfin Gant, NP   20 mg at 01/31/15 1652  . zolpidem (AMBIEN) tablet 5 mg  5 mg Oral QHS PRN Delfin Gant, NP   5 mg at 01/31/15 2113    Lab Results:  No results found for this or any previous visit (from the past 48 hour(s)).  Physical  Findings: AIMS: Facial and Oral Movements Muscles of Facial Expression: None, normal Lips and Perioral Area: None, normal Jaw: None, normal Tongue: None, normal,Extremity Movements Upper (arms, wrists, hands, fingers): None, normal Lower (legs, knees, ankles, toes): None, normal, Trunk Movements Neck, shoulders, hips: None, normal, Overall Severity Severity of abnormal movements (highest score from questions  above): None, normal Incapacitation due to abnormal movements: None, normal Patient's awareness of abnormal movements (rate only patient's report): No Awareness, Dental Status Current problems with teeth and/or dentures?: No Does patient usually wear dentures?: No  CIWA:  CIWA-Ar Total: 3 COWS:  COWS Total Score: 2  Musculoskeletal: Strength & Muscle Tone: within normal limits Gait & Station: normal Patient leans: N/A  Psychiatric Specialty Exam: Review of Systems  Psychiatric/Behavioral: Positive for depression. Negative for suicidal ideas. The patient is nervous/anxious and has insomnia.   All other systems reviewed and are negative. today denies chest pain, no shortness of breath , describes sore throat, no fever, no chills   Blood pressure 133/80, pulse 65, temperature 98.4 F (36.9 C), temperature source Oral, resp. rate 18, height 6' (1.829 m), weight 182 lb (82.555 kg), SpO2 100 %.Body mass index is 24.68 kg/(m^2).  General Appearance: Casual  Eye Contact::  Good  Speech:  Clear and Coherent and Normal Rate  Volume:  Normal  Mood:   Depressed   Affect:  Depressed  Thought Process:  Coherent and Goal Directed  Orientation:  Full (Time, Place, and Person)  Thought Content:   Denies hallucinations, no delusions , ruminative about stressors as above   Suicidal Thoughts:  No- currently denies plan or intention of hurting self or of SI and contracts for safety on the unit   Homicidal Thoughts:  No  Memory:  Immediate;   Fair Recent;   Fair Remote;   Fair  Judgement:   Fair  Insight:  Fair  Psychomotor Activity:  Decreased   Concentration:  Good  Recall:  Good  Fund of Knowledge:Good  Language: Good  Akathisia:  No  Handed:    AIMS (if indicated):     Assets:  Communication Skills Desire for Improvement Resilience Social Support  ADL's:  Intact  Cognition: WNL  Sleep:  Number of Hours: 5.5  Assessment - patient  Presents with some ongoing depression, sadness, ruminations about significant psychosocial stressors, mainly homelessness and poor support network. Denies current suicidal ideations, and behavior on unit in good control. Thus far tolerating medications well - on Paxil for depression.  Treatment Plan Summary: Daily contact with patient to assess and evaluate symptoms and progress in treatment and Medication management  Medications:  Continue to encourage increased milieu participation to work on coping skills and ego strengths / symptom reduction. -Continue nicotine patch for nicotine cravings  -continue Paxil 20mg  daily for  depression -Continue Ambien 5mg  qhs prn insomnia  -Continue cardiac and HIV meds as given outpatient  -Mucinex 600mg  bid for congestion  -Flonase nasal spray 23mcg bid bilaterally   -Mobic 7.5mg  for chronic pain CSW / treatment team working on disposition planning options   Neita Garnet,  MD  02/01/2015, 10:45PM

## 2015-02-01 NOTE — [facility] Group Notes (Signed)
[facility] LCSW Aftercare Discharge Planning Group Note  02/01/2015 8:45 AM  Pt did not attend, declined invitation.   [name], [place] 02/01/2015 9:24 AM

## 2015-02-01 NOTE — Progress Notes (Signed)
Adult Psychoeducational Group Note  Date:  02/01/2015 Time:  9:28 PM  Group Topic/Focus:  Wrap-Up Group:   The focus of this group is to help patients review their daily goal of treatment and discuss progress on daily workbooks.  Participation Level:  Active  Participation Quality:  Appropriate and Attentive  Affect:  Appropriate  Cognitive:  Appropriate  Insight: Appropriate  Engagement in Group:  Engaged  Modes of Intervention:  Discussion  Additional Comments:  Pt rated his day was all right today. Pt stated his goal was to get intouch with his family to take care of some things for him which he was able to do. Pt stated one positive thing is he knows he has to do what he has to without making excuses.  Clint Bolder 02/01/2015, 9:28 PM

## 2015-02-02 ENCOUNTER — Encounter (HOSPITAL_COMMUNITY): Payer: Self-pay | Admitting: Emergency Medicine

## 2015-02-02 ENCOUNTER — Inpatient Hospital Stay (HOSPITAL_COMMUNITY): Payer: Medicare Other

## 2015-02-02 LAB — BASIC METABOLIC PANEL
Anion gap: 5 (ref 5–15)
BUN: 16 mg/dL (ref 6–20)
CALCIUM: 9.1 mg/dL (ref 8.9–10.3)
CHLORIDE: 106 mmol/L (ref 101–111)
CO2: 26 mmol/L (ref 22–32)
CREATININE: 0.98 mg/dL (ref 0.61–1.24)
GFR calc Af Amer: >60 mL/min (ref >60)
GFR calc non Af Amer: >60 mL/min (ref >60)
Glucose, Bld: 114 mg/dL — ABNORMAL HIGH (ref 65–99)
Potassium: 4.4 mmol/L (ref 3.5–5.1)
Sodium: 137 mmol/L (ref 135–145)

## 2015-02-02 LAB — CBC WITH DIFFERENTIAL/PLATELET
Basophils Absolute: 0 10*3/uL (ref 0.0–0.1)
Basophils Relative: 0 %
EOS ABS: 0.2 10*3/uL (ref 0.0–0.7)
Eosinophils Relative: 3 %
HEMATOCRIT: 36.7 % — AB (ref 39.0–52.0)
HEMOGLOBIN: 12.5 g/dL — AB (ref 13.0–17.0)
LYMPHS ABS: 1.1 10*3/uL (ref 0.7–4.0)
Lymphocytes Relative: 14 %
MCH: 33.4 pg (ref 26.0–34.0)
MCHC: 34.1 g/dL (ref 30.0–36.0)
MCV: 98.1 fL (ref 78.0–100.0)
MONO ABS: 1.3 10*3/uL — AB (ref 0.1–1.0)
MONOS PCT: 17 %
NEUTROS ABS: 5.1 10*3/uL (ref 1.7–7.7)
NEUTROS PCT: 66 %
Platelets: 137 10*3/uL — ABNORMAL LOW (ref 150–400)
RBC: 3.74 MIL/uL — ABNORMAL LOW (ref 4.22–5.81)
RDW: 11.7 % (ref 11.5–15.5)
WBC: 7.7 10*3/uL (ref 4.0–10.5)

## 2015-02-02 LAB — I-STAT TROPONIN, ED: Troponin i, poc: 0.01 ng/mL (ref 0.00–0.08)

## 2015-02-02 MED ORDER — ASPIRIN 325 MG PO TABS
325.0000 mg | ORAL_TABLET | Freq: Every day | ORAL | Status: DC
  Administered 2015-02-02 – 2015-02-03 (×2): 325 mg via ORAL
  Filled 2015-02-02 (×6): qty 1

## 2015-02-02 MED ORDER — OXYCODONE-ACETAMINOPHEN 5-325 MG PO TABS: 1.0000 | ORAL_TABLET | Freq: Once | ORAL | Status: DC

## 2015-02-02 NOTE — ED Provider Notes (Signed)
CSN: [id number]     Arrival date & time 02/02/15  1329 History   First MD Initiated Contact with Patient 02/02/15 1333     Chief Complaint  Patient presents with  . Chest Pain     (Consider location/radiation/quality/duration/timing/severity/associated sxs/prior Treatment) The history is provided by the patient. No language interpreter was used.  Nathan Santana is a 54 year old male with a history of HIV, hypertension, and depression who presents from behavioral health for intermittent achy chest pain that began 3 days ago. He states the pain would only last a few minutes at a time. He states he was given nitroglycerin yesterday and an aspirin today while at behavioral health. His symptoms have since resolved. He denies any radiation of the pain. He states his sister may have cardiac history and some of his other siblings but is unsure. He smokes one pack per week. He takes 81 mg of aspirin daily. He denies any history of MI or stent placement. He denies history of diabetes, hyperlipidemia, DVT or PE. He denies any recent cough, fever, diaphoresis, shortness of breath, abdominal pain, nausea, vomiting, diarrhea.   Past Medical History  Diagnosis Date  . HIV infection ([place])   . Cancer ([place])   . Hypertension   . Angina pectoris ([place])   . Scrotal cyst   . Anxiety   . Depression    Past Surgical History  Procedure Laterality Date  . Scrotal turmor removed     Family History  Problem Relation Age of Onset  . Hypertension Other    Social History  Substance Use Topics  . Smoking status: Current Some Day Smoker -- 0.50 packs/day    Types: Cigars  . Smokeless tobacco: None  . Alcohol Use: 0.6 oz/week    1 Cans of beer per week     Comment: one beer weekly    Review of Systems  Constitutional: Negative for fever and chills.  Respiratory: Negative for shortness of breath.   Cardiovascular: Positive for chest pain.  Gastrointestinal: Negative for vomiting and abdominal pain.  All  other systems reviewed and are negative.     Allergies  Review of patient's allergies indicates no known allergies.  Home Medications   Prior to Admission medications   Medication Sig Start Date End Date Taking? Authorizing Provider  amLODipine (NORVASC) 10 MG tablet Take 10 mg by mouth daily.  01/11/15   Historical Provider, MD  ATRIPLA 600-200-300 MG tablet TK 1 T PO HS 01/11/15   Historical Provider, MD  carvedilol (COREG) 25 MG tablet Take 25 mg by mouth 2 (two) times daily with a meal.  01/11/15   Historical Provider, MD  ibuprofen (ADVIL,MOTRIN) 800 MG tablet take 1 tablet by mouth every 8 hours if needed for pain 12/29/14   Historical Provider, MD  lisinopril (PRINIVIL,ZESTRIL) 10 MG tablet Take 10 mg by mouth daily. 12/17/14   Historical Provider, MD  NITROSTAT 0.4 MG SL tablet Place 0.4 mg under the tongue every 5 (five) minutes as needed for chest pain.  01/11/15   Historical Provider, MD  pantoprazole (PROTONIX) 40 MG tablet Take 40 mg by mouth daily. 12/17/14   Historical Provider, MD  rosuvastatin (CRESTOR) 20 MG tablet Take 20 mg by mouth daily.  01/11/15   Historical Provider, MD   BP 137/83 mmHg  Pulse 72  Temp(Src) 98.2 F (36.8 C) (Oral)  Resp 18  Ht 6' (1.829 m)  Wt 190 lb (86.183 kg)  BMI 25.76 kg/m2  SpO2 99% Physical Exam  Constitutional: He is oriented to person, place, and time. He appears well-developed and well-nourished.  HENT:  Head: Normocephalic and atraumatic.  Eyes: Conjunctivae are normal.  Neck: Normal range of motion. Neck supple. No JVD present.  Cardiovascular: Normal rate, regular rhythm and normal heart sounds.   No murmur heard. Regular rate and rhythm. No murmur.  Pulmonary/Chest: Effort normal and breath sounds normal. No respiratory distress. He has no wheezes. He has no rales.  Lungs clear to auscultation bilaterally. No crackles or wheezing.  Abdominal: Soft. He exhibits no distension. There is no tenderness. There is no rebound and  no guarding.  Abdomen is soft and nontender.  Distal pulses intact.  Musculoskeletal: Normal range of motion. He exhibits no edema or tenderness.  No calf tenderness or lower extremity swelling.  Neurological: He is alert and oriented to person, place, and time.  Skin: Skin is warm and dry.  Psychiatric: He has a normal mood and affect.  Nursing note and vitals reviewed.   ED Course  Procedures (including critical care time) Labs Review Labs Reviewed  CBC WITH DIFFERENTIAL/PLATELET - Abnormal; Notable for the following:    RBC 3.74 (*)    Hemoglobin 12.5 (*)    HCT 36.7 (*)    Platelets 137 (*)    Monocytes Absolute 1.3 (*)    All other components within normal limits  BASIC METABOLIC PANEL - Abnormal; Notable for the following:    Glucose, Bld 114 (*)    All other components within normal limits  HEMOGLOBIN A1C  TSH  I-STAT TROPOININ, ED    Imaging Review Dg Chest 2 View  02/02/2015  CLINICAL DATA:  Left chest pain for 1 week. Hypertension. History of HIV infection. EXAM: CHEST  2 VIEW COMPARISON:  None. FINDINGS: The heart size and mediastinal contours are within normal limits. Both lungs are clear. The visualized skeletal structures are unremarkable. IMPRESSION: No active cardiopulmonary disease. Electronically Signed   By: Van Clines M.D.   On: 02/02/2015 14:41   I have personally reviewed and evaluated these images and lab results as part of my medical decision-making.   EKG Interpretation   Date/Time:  Tuesday February 02 2015 13:51:49 EST Ventricular Rate:  67 PR Interval:  167 QRS Duration: 144 QT Interval:  451 QTC Calculation: 476 R Axis:   79 Text Interpretation:  Sinus rhythm Right bundle branch block No  significant change since 01/31/2015 Confirmed by DELO  MD, DOUGLAS ([id number])  on 02/02/2015 3:07:00 PM      MDM   Final diagnoses:  Chest pain, unspecified chest pain type   Patient with a history of HIV presents for intermittent chest  pain that began 3 days ago and only lasts a few minutes at a time. He was given nitroglycerin yesterday and aspirin today by behavioral health. He states that his chest pain has since resolved. He is well appearing and vital signs are stable. His EKG shows a right bundle-branch block but is otherwise comparable to his two previous EKGs in the past week. Troponin is negative and labs are not concerning. I do not believe this patient needs a repeat troponin since this has been ongoing for 3 days. Chest xray is negative for pneumonia, edema, or pneumothorax. A TSH and hemoglobin A1c was taken by behavioral health and is normal.  I do not believe this is acute coronary syndrome. Patient can be transferred back to behavioral health.  He verbally agrees with the plan.     Ottie Glazier, PA-C  02/02/15 [place], MD 02/02/15 [phone]

## 2015-02-02 NOTE — Discharge Instructions (Signed)
Nonspecific Chest Pain  Follow up with a primary care physician using the resource guide below. Return for chest pain, shortness of breath, diaphoresis. Chest pain can be caused by many different conditions. There is always a chance that your pain could be related to something serious, such as a heart attack or a blood clot in your lungs. Chest pain can also be caused by conditions that are not life-threatening. If you have chest pain, it is very important to follow up with your health care provider. CAUSES  Chest pain can be caused by:  Heartburn.  Pneumonia or bronchitis.  Anxiety or stress.  Inflammation around your heart (pericarditis) or lung (pleuritis or pleurisy).  A blood clot in your lung.  A collapsed lung (pneumothorax). It can develop suddenly on its own (spontaneous pneumothorax) or from trauma to the chest.  Shingles infection (varicella-zoster virus).  Heart attack.  Damage to the bones, muscles, and cartilage that make up your chest wall. This can include:  Bruised bones due to injury.  Strained muscles or cartilage due to frequent or repeated coughing or overwork.  Fracture to one or more ribs.  Sore cartilage due to inflammation (costochondritis). RISK FACTORS  Risk factors for chest pain may include:  Activities that increase your risk for trauma or injury to your chest.  Respiratory infections or conditions that cause frequent coughing.  Medical conditions or overeating that can cause heartburn.  Heart disease or family history of heart disease.  Conditions or health behaviors that increase your risk of developing a blood clot.  Having had chicken pox (varicella zoster). SIGNS AND SYMPTOMS Chest pain can feel like:  Burning or tingling on the surface of your chest or deep in your chest.  Crushing, pressure, aching, or squeezing pain.  Dull or sharp pain that is worse when you move, cough, or take a deep breath.  Pain that is also felt in your  back, neck, shoulder, or arm, or pain that spreads to any of these areas. Your chest pain may come and go, or it may stay constant. DIAGNOSIS Lab tests or other studies may be needed to find the cause of your pain. Your health care provider may have you take a test called an ambulatory ECG (electrocardiogram). An ECG records your heartbeat patterns at the time the test is performed. You may also have other tests, such as:  Transthoracic echocardiogram (TTE). During echocardiography, sound waves are used to create a picture of all of the heart structures and to look at how blood flows through your heart.  Transesophageal echocardiogram (TEE).This is a more advanced imaging test that obtains images from inside your body. It allows your health care provider to see your heart in finer detail.  Cardiac monitoring. This allows your health care provider to monitor your heart rate and rhythm in real time.  Holter monitor. This is a portable device that records your heartbeat and can help to diagnose abnormal heartbeats. It allows your health care provider to track your heart activity for several days, if needed.  Stress tests. These can be done through exercise or by taking medicine that makes your heart beat more quickly.  Blood tests.  Imaging tests. TREATMENT  Your treatment depends on what is causing your chest pain. Treatment may include:  Medicines. These may include:  Acid blockers for heartburn.  Anti-inflammatory medicine.  Pain medicine for inflammatory conditions.  Antibiotic medicine, if an infection is present.  Medicines to dissolve blood clots.  Medicines to treat coronary  artery disease.  Supportive care for conditions that do not require medicines. This may include:  Resting.  Applying heat or cold packs to injured areas.  Limiting activities until pain decreases. HOME CARE INSTRUCTIONS  If you were prescribed an antibiotic medicine, finish it all even if you  start to feel better.  Avoid any activities that bring on chest pain.  Do not use any tobacco products, including cigarettes, chewing tobacco, or electronic cigarettes. If you need help quitting, ask your health care provider.  Do not drink alcohol.  Take medicines only as directed by your health care provider.  Keep all follow-up visits as directed by your health care provider. This is important. This includes any further testing if your chest pain does not go away.  If heartburn is the cause for your chest pain, you may be told to keep your head raised (elevated) while sleeping. This reduces the chance that acid will go from your stomach into your esophagus.  Make lifestyle changes as directed by your health care provider. These may include:  Getting regular exercise. Ask your health care provider to suggest some activities that are safe for you.  Eating a heart-healthy diet. A registered dietitian can help you to learn healthy eating options.  Maintaining a healthy weight.  Managing diabetes, if necessary.  Reducing stress. SEEK MEDICAL CARE IF:  Your chest pain does not go away after treatment.  You have a rash with blisters on your chest.  You have a fever. SEEK IMMEDIATE MEDICAL CARE IF:   Your chest pain is worse.  You have an increasing cough, or you cough up blood.  You have severe abdominal pain.  You have severe weakness.  You faint.  You have chills.  You have sudden, unexplained chest discomfort.  You have sudden, unexplained discomfort in your arms, back, neck, or jaw.  You have shortness of breath at any time.  You suddenly start to sweat, or your skin gets clammy.  You feel nauseous or you vomit.  You suddenly feel light-headed or dizzy.  Your heart begins to beat quickly, or it feels like it is skipping beats. These symptoms may represent a serious problem that is an emergency. Do not wait to see if the symptoms will go away. Get medical  help right away. Call your local emergency services (911 in the [place] Do not drive yourself to the hospital.   This information is not intended to replace advice given to you by your health care provider. Make sure you discuss any questions you have with your health care provider.   Document Released: 12/14/2004 Document Revised: 03/27/2014 Document Reviewed: 10/10/2013 Elsevier Interactive Patient Education 2016 [employer].  Emergency Department Resource Guide 1) Find a Doctor and Pay Out of Pocket Although you won't have to find out who is covered by your insurance plan, it is a good idea to ask around and get recommendations. You will then need to call the office and see if the doctor you have chosen will accept you as a new patient and what types of options they offer for patients who are self-pay. Some doctors offer discounts or will set up payment plans for their patients who do not have insurance, but you will need to ask so you aren't surprised when you get to your appointment.  2) Contact Your Local Health Department Not all health departments have doctors that can see patients for sick visits, but many do, so it is worth a call to see if  yours does. If you don't know where your local health department is, you can check in your phone book. The CDC also has a tool to help you locate your state's health department, and many state websites also have listings of all of their local health departments.  3) Find a [facility] If your illness is not likely to be very severe or complicated, you may want to try a walk in clinic. These are popping up all over the country in pharmacies, drugstores, and shopping centers. They're usually staffed by nurse practitioners or physician assistants that have been trained to treat common illnesses and complaints. They're usually fairly quick and inexpensive. However, if you have serious medical issues or chronic medical problems, these are probably not your  best option.  No Primary Care Doctor: - Call Health Connect at  [phone] - they can help you locate a primary care doctor that  accepts your insurance, provides certain services, etc. - Physician Referral Service- [phone]  Chronic Pain Problems: Organization         Address  Phone   Notes  [facility]  [phone] Patients need to be referred by their primary care doctor.   Medication Assistance: Organization         Address  Phone   Notes  [facility] Medication [facility] [address] [place], [place] [address] [phone] --Must be a resident of [facility] -- Must have NO insurance coverage whatsoever (no Medicaid/ Medicare, etc.) -- The pt. MUST have a primary care doctor that directs their care regularly and follows them in the community   MedAssist  [phone]   [employer]  [phone]    Agencies that provide inexpensive medical care: Organization         Address  Phone   Notes  [place]  [phone]   [facility]    [phone]   [facility] [place], [place] [address] [phone]   [place] [address], [place] [phone]   Planned Parenthood    [phone]   [facility]    [phone]   [place] and [address], [place] Phone:  [phone], Fax:  [phone] Hours of Operation:  9 am - 6 pm, M-F.  Also accepts Medicaid/Medicare and self-pay.  [facility] [place] for [address] [place], [address], [place] Phone: [phone], Fax: [phone]. Hours of Operation:  8:30 am - 5:30 pm, M-F.  Also accepts Medicaid and self-pay.  [facility] [place] [address] [place] Phone: [phone]   [place], [place], [place] [phone], Ext. [phone] Mondays & Thursdays: 7-9 AM.  First 15  patients are seen on a first come, first serve basis.    [place] Providers:  Organization         Address  Phone   Notes  [facility] [address], [place] [phone] Also accepts self-pay patients.  [place], [place]  [phone]   [place], [address], [place] [phone]   [place] [address], [place] [phone]   Lucianne Lei [address], [place]   [phone])  [id number] Only accepts [place] Access Medicaid patients after they have their name applied to their card.   Self-Pay (no insurance) in [facility]:  Organization         Address  Phone   Notes  Sickle Cell Patients, [facility] [place] [phone]   [facility] [place] [phone]   [facility] [place]  [place], [place], [place] [phone]   [facility]/Dr. Osei-Bonsu  [address], [place] or [place] [address], [place] [phone] Phone number for both [place] and [place] locations is the same.  Urgent Medical and [facility] [address], [place] [phone]   [facility] [address] [place] or [address] [phone]   [facility] [address] [place] [phone], phone; [phone], fax Sees patients 1st and 3rd Saturday of every month.  Must not qualify for public or private insurance (i.e. Medicaid, Medicare, [place] Health Choice, Veterans' Benefits)  Household income should be no more than 200% of the poverty level The clinic cannot treat you if you are pregnant or think you are pregnant  Sexually transmitted diseases are not treated at the clinic.    Dental  Care: Organization         Address  Phone  Notes  [facility] Department of [facility] [place] [phone] Accepts children up to age 30 who are enrolled in [place] or [place]; pregnant women with a Medicaid card; and children who have applied for Medicaid or [place] Health Choice, but were declined, whose parents can pay a reduced fee at time of service.  [facility] Department of [facility]  [address], [place] [phone] Accepts children up to age 48 who are enrolled in [place] or [place]; pregnant women with a Medicaid card; and children who have applied for Medicaid or [place] Health Choice, but were declined, whose parents can pay a reduced fee at time of service.  [place] Adult Dental Access PROGRAM  [place] [phone] Patients are seen by appointment only. Walk-ins are not accepted. [place] will see patients 73 years of age and older. Monday - Tuesday (8am-5pm) Most Wednesdays (8:30-5pm) $30 per visit, cash only  [facility] Adult Dental Access PROGRAM  [address], [facility] [place] [phone] Patients are seen by appointment only. Walk-ins are not accepted. [place] will see patients 20 years of age and older. One Wednesday Evening (Monthly: Volunteer Based).  $30 per visit, cash only  [place]  [phone] for adults; Children under age 35, call Graduate Pediatric Dentistry at [phone]. Children aged 17-14, please call [phone] to request a pediatric application.  Dental services are provided in all areas of dental care including fillings, crowns and bridges, complete and partial dentures, implants, gum treatment, root canals, and extractions. Preventive care is also provided. Treatment is provided to both adults and children. Patients are selected via a lottery and there is often a waiting list.   [facility] [address] [place]  [phone] [email or web address], [place], [place] [phone], Ext. [phone] Second and Fourth Thursday of each month, opens at 6:30 AM;  Clinic ends at 9 AM.  Patients are seen on a first-come first-served basis, and a limited number are seen during each clinic.   [facility]  [address] [place], [place] [phone]   Eligibility Requirements You must have lived in [place], [place], or [place] counties for at least the last three months.   You cannot be eligible for state or federal sponsored [employer], including [employer], [place], or [employer].   You generally cannot be eligible for healthcare insurance through your employer.    How to apply: Eligibility screenings are held every Tuesday and Wednesday afternoon from 1:00 pm until 4:00 pm. You do not need an appointment for the interview!  [facility] [address] [place], [place]   [place] Department  [place]  [phone]    Behavioral Health Resources in the Community: Intensive Outpatient Programs Organization         Address  Phone  Notes  [place]. [address] [place], [place] [phone]   [facility] Outpatient [address], [place], [place]   ADS: Alcohol & Drug Svcs [address], [address], [place]   [place] [address],  [place], [place] or [phone]   Substance Abuse Resources Organization         Address  Phone  Notes  Alcohol and Drug Services  [phone]   [place]  [phone]   [facility] [place]   Chinita Pester  [phone]   Residential & Outpatient Substance Abuse Program  [phone]    Psychological Services Organization         Address  Phone  Notes  [facility] [place]  [place]  [phone]   [place] [address] [place] or [phone]    Mobile Crisis Teams Organization         Address  Phone  Notes  [facility], Mobile Crisis Care Unit  [phone]   Assertive Psychotherapeutic Services  [address] [place], [place]   [place] [address] [place] [phone]    Self-Help/Support Groups Organization         Address  Phone             Notes  [place]. of [place] - variety of support groups  [place] Call for more information  Narcotics Anonymous (NA), Caring Services [address], [employer] [place]  2 meetings at this location   Special educational needs teacher         Address  Phone  Notes  ASAP Residential Treatment [place],    [facility]  [phone]   [facility]  [address] [place] [phone], [place], [place]   [place], [place] [phone] Admissions: 8am-3pm M-F  Incentives Substance [place] [address]    [place], [place] [phone]   [facility] [address] Jadene Pierini [place], [place]   [facility] [address],  [place], [place]   Insight Programs - Intensive Outpatient [address] Kristeen [address], [place], [place]   [facility] ([place] [address] [employer], [place] or [phone]  [facility]idential Treatment Services (RTS) [address] [place], [place] Accepts Medicaid  [facility] [place] [address],  [place] [phone] Substance Abuse/Addiction Treatment   [facility] Organization         Address  Phone  Notes  [facility]  [phone]   Domenic Schwab, PhD [address] Arlis Port[place], [place]   [phone] or [phone]   [place], [place] [phone]   [place] [address], [place], [place] [phone] Insurance/Medicaid/sponsorship through [facility] and Families [address], [address]                                    [place], [place] [phone] [place] [address] [place], [place] [phone]    Dr. Adele Schilder  [phone]   [facility] [place] Dept. 1) [address], [place] 2) [place] 3)  [facility], [place] [phone]  [phone]   [place] [phone] or [phone] (After Hours)

## 2015-02-02 NOTE — ED Notes (Signed)
Pt's trained sitter at bedside will go with pt back to [facility]. RN spoke with Ronie at [facility] RN and gave report.

## 2015-02-02 NOTE — B[facility] Group Notes (Signed)
Adult Psychoeducational Group Note  Date:  02/02/2015 Time:  11:44 PM  Group Topic/Focus:  Wrap-Up Group:   The focus of this group is to help patients review their daily goal of treatment and discuss progress on daily workbooks.  Participation Level:  Minimal  Participation Quality:  Attentive  Affect:  Flat  Cognitive:  Appropriate  Insight: Good  Engagement in Group:  Limited  Modes of Intervention:  Discussion  Additional Comments:  Patient stated his day was "good for what it's worth".  His goal was to be more understanding with what's going on with him and others.  He also stated he needed to take his time in treatment because of his heart condition and do the best he can.  He also stated he has to remain calm and he wants peace in his life.  Victorino Sparrow A 02/02/2015, 11:44 PM

## 2015-02-02 NOTE — ED Notes (Signed)
Pt in room with sitter

## 2015-02-02 NOTE — Progress Notes (Signed)
Pt complained of some chest discomfort right after coming back from the er but was observed laughing and talking standing at the nurses station desk  He never complained anymore until he got his hs medications then he expressed being upset that no one addressed his issue    Encouraged pt to tell staff when he continues to have pain that has not been addressed    Put some nitroglycerin tabs in his drawer   Pt said he thought his discomfort was coming from stress so pt was given ativan for anxiety and stress    Verbal support and encouragement given   Medications administered and effectiveness monitored   Q 15 min checks  Pt is safe and receptive to verbal support

## 2015-02-02 NOTE — ED Notes (Signed)
Pt here from [facility] to rule out chest pain , pt states that the pain comes and goes , no sob or pain at this time

## 2015-02-02 NOTE — Progress Notes (Signed)
Patient ID: Claudius Mich, male   DOB: 1960/04/17, 54 y.o.   MRN: [id number] [facility] MD Progress Note  02/02/2015  Bryan Goin  MRN:  [id number] Subjective:   Patient reports  Partial improvement compared to admission, but states he has been feeling quite anxious, particularly as he approaches discharge, and relating to disposition options. Denies medication side effects at this time. Denies any current cravings , but states he is aware of increased risk of relapse if he returns to same living situation. He is hoping to be able to go to a residential setting. He was considering an [place], but states he would not be able to afford it , and finding this out exacerbated his anxiety symptoms. Of note, patient has history of angina , and reports fleeting episodes of chest discomfort /pain related to his anxiety, not to exertion . These episodes are fleeting, and at this time he denies any current chest discomfort, or any other associated symptom.   Objective:  Patient case discussed with treatment team and patient seen .  Mood improving, but remains anxious and ruminative about disposition planning, as above  Visible in milieu, going to some groups, behavior in good control, pleasant on approach. At this time not endorsing chest pain or shortness of breath, does not appear to be uncomfortable, diaphoretic, or pale . As noted, however, he does report history of angina, intermittent short lived episodes of pain, described as fleeting. Denies any suicidal ideations at present, contracts for safety on the unit .   Denies medication side effects.   Principal Problem: Major depressive disorder, recurrent, severe without psychotic features ([place]) Diagnosis:   Patient Active Problem List   Diagnosis Date Noted  . Severe recurrent major depression without psychotic features ([place]) [F33.2] 01/28/2015  . Major depressive disorder, recurrent, severe without psychotic features ([place]) [F33.2] 01/28/2015    Total Time spent with patient: 20 minutes  Past Medical History:  Past Medical History  Diagnosis Date  . HIV infection ([place])   . Cancer ([place])   . Hypertension   . Angina pectoris ([place])   . Scrotal cyst   . Anxiety   . Depression     Past Surgical History  Procedure Laterality Date  . Scrotal turmor removed     Family History:  Family History  Problem Relation Age of Onset  . Hypertension Other    Social History:  History  Alcohol Use  . 0.6 oz/week  . 1 Cans of beer per week    Comment: one beer weekly     History  Drug Use No    Comment: no drugs since 2008    Social History   Social History  . Marital Status: Single    Spouse Name: N/A  . Number of Children: N/A  . Years of Education: N/A   Social History Main Topics  . Smoking status: Current Some Day Smoker -- 0.50 packs/day    Types: Cigars  . Smokeless tobacco: None  . Alcohol Use: 0.6 oz/week    1 Cans of beer per week     Comment: one beer weekly  . Drug Use: No     Comment: no drugs since 2008  . Sexual Activity: No   Other Topics Concern  . None   Social History Narrative   Additional Social History:    Pain Medications: advil Prescriptions: norvasc  atripla   coreg   lisinopril   nitrostat   protonix   crestor Over the Counter: advil History  of alcohol / drug use?: Yes Longest period of sobriety (when/how long): since 2008 Negative Consequences of Use: Financial, Legal, Personal relationships, Work / School Withdrawal Symptoms: Other (Comment) (anxiety, depression, panic ) Name of Substance 1: alcohol 1 - Age of First Use: not sure 1 - Amount (size/oz): 1 beer weekly 1 - Frequency: 1 beer weekly 1 - Duration: ongoing 1 - Last Use / Amount: last week  Sleep: Good  Appetite:  Good  Current Medications: Current Facility-Administered Medications  Medication Dose Route Frequency Provider Last Rate Last Dose  . acetaminophen (TYLENOL) tablet 650 mg  650 mg Oral Q4H PRN  Delfin Gant, NP   650 mg at 02/01/15 0846  . alum & mag hydroxide-simeth (MAALOX/MYLANTA) 200-200-20 MG/5ML suspension 30 mL  30 mL Oral PRN Delfin Gant, NP      . amLODipine (NORVASC) tablet 10 mg  10 mg Oral Daily Delfin Gant, NP   10 mg at 02/02/15 5465  . aspirin tablet 325 mg  325 mg Oral Daily Jenne Campus, MD   325 mg at 02/02/15 1251  . carvedilol (COREG) tablet 25 mg  25 mg Oral BID WC Delfin Gant, NP   25 mg at 02/02/15 6812  . efavirenz-emtricitabine-tenofovir (ATRIPLA) 600-200-300 MG per tablet 1 tablet  1 tablet Oral QHS Delfin Gant, NP   1 tablet at 02/01/15 2137  . feeding supplement (BOOST / RESOURCE BREEZE) liquid 1 Container  1 Container Oral TID BM [place], RD   1 Container at 02/02/15 1028  . fluticasone (FLONASE) 50 MCG/ACT nasal spray 1 spray  1 spray Each Nare BID Benjamine Mola, FNP   1 spray at 02/02/15 0813  . guaiFENesin (MUCINEX) 12 hr tablet 600 mg  600 mg Oral BID Benjamine Mola, FNP   600 mg at 02/02/15 7517  . lisinopril (PRINIVIL,ZESTRIL) tablet 10 mg  10 mg Oral Daily Delfin Gant, NP   10 mg at 02/02/15 0017  . LORazepam (ATIVAN) tablet 1 mg  1 mg Oral Q8H PRN Delfin Gant, NP   1 mg at 02/01/15 2137  . meloxicam (MOBIC) tablet 7.5 mg  7.5 mg Oral Daily Myer Peer [place], MD   7.5 mg at 02/02/15 4944  . nicotine (NICODERM CQ - dosed in mg/24 hours) patch 21 mg  21 mg Transdermal Daily Jenne Campus, MD   21 mg at 01/30/15 0843  . nitroGLYCERIN (NITROSTAT) SL tablet 0.4 mg  0.4 mg Sublingual Q5 min PRN Delfin Gant, NP   0.4 mg at 01/31/15 1923  . ondansetron (ZOFRAN) tablet 4 mg  4 mg Oral Q8H PRN Delfin Gant, NP      . pantoprazole (PROTONIX) EC tablet 40 mg  40 mg Oral Daily Delfin Gant, NP   40 mg at 02/02/15 9675  . PARoxetine (PAXIL) tablet 20 mg  20 mg Oral Daily Delfin Gant, NP   20 mg at 02/02/15 9163  . rosuvastatin (CRESTOR) tablet 20 mg  20 mg Oral q1800 Delfin Gant, NP   20 mg at 02/01/15 1822  . zolpidem (AMBIEN) tablet 5 mg  5 mg Oral QHS PRN Delfin Gant, NP   5 mg at 02/01/15 2137   Current Outpatient Prescriptions  Medication Sig Dispense Refill  . amLODipine (NORVASC) 10 MG tablet Take 10 mg by mouth daily.     . ATRIPLA 600-200-300 MG tablet TK 1 T PO HS  1  .  carvedilol (COREG) 25 MG tablet Take 25 mg by mouth 2 (two) times daily with a meal.     . ibuprofen (ADVIL,MOTRIN) 800 MG tablet take 1 tablet by mouth every 8 hours if needed for pain  0  . lisinopril (PRINIVIL,ZESTRIL) 10 MG tablet Take 10 mg by mouth daily.  0  . NITROSTAT 0.4 MG SL tablet Place 0.4 mg under the tongue every 5 (five) minutes as needed for chest pain.     . pantoprazole (PROTONIX) 40 MG tablet Take 40 mg by mouth daily.  0  . rosuvastatin (CRESTOR) 20 MG tablet Take 20 mg by mouth daily.       Lab Results:  Results for orders placed or performed during the hospital encounter of 01/28/15 (from the past 48 hour(s))  CBC with Differential     Status: Abnormal   Collection Time: 02/02/15  2:05 PM  Result Value Ref Range   WBC 7.7 4.0 - 10.5 K/uL   RBC 3.74 (L) 4.22 - 5.81 MIL/uL   Hemoglobin 12.5 (L) 13.0 - 17.0 g/dL   HCT 36.7 (L) 39.0 - 52.0 %   MCV 98.1 78.0 - 100.0 fL   MCH 33.4 26.0 - 34.0 pg   MCHC 34.1 30.0 - 36.0 g/dL   RDW 11.7 11.5 - 15.5 %   Platelets 137 (L) 150 - 400 K/uL   Neutrophils Relative % 66 %   Neutro Abs 5.1 1.7 - 7.7 K/uL   Lymphocytes Relative 14 %   Lymphs Abs 1.1 0.7 - 4.0 K/uL   Monocytes Relative 17 %   Monocytes Absolute 1.3 (H) 0.1 - 1.0 K/uL   Eosinophils Relative 3 %   Eosinophils Absolute 0.2 0.0 - 0.7 K/uL   Basophils Relative 0 %   Basophils Absolute 0.0 0.0 - 0.1 K/uL  Basic metabolic panel     Status: Abnormal   Collection Time: 02/02/15  2:05 PM  Result Value Ref Range   Sodium 137 135 - 145 mmol/L   Potassium 4.4 3.5 - 5.1 mmol/L   Chloride 106 101 - 111 mmol/L   CO2 26 22 - 32 mmol/L   Glucose, Bld  114 (H) 65 - 99 mg/dL   BUN 16 6 - 20 mg/dL   Creatinine, Ser 0.98 0.61 - 1.24 mg/dL   Calcium 9.1 8.9 - 10.3 mg/dL   GFR calc non Af Amer >60 >60 mL/min   GFR calc Af Amer >60 >60 mL/min    Comment: (NOTE) The eGFR has been calculated using the CKD EPI equation. This calculation has not been validated in all clinical situations. eGFR's persistently <60 mL/min signify possible Chronic Kidney Disease.    Anion gap 5 5 - 15  I-Stat Troponin, ED (not at [facility])     Status: None   Collection Time: 02/02/15  2:13 PM  Result Value Ref Range   Troponin i, poc 0.01 0.00 - 0.08 ng/mL   Comment 3            Comment: Due to the release kinetics of cTnI, a negative result within the first hours of the onset of symptoms does not rule out myocardial infarction with certainty. If myocardial infarction is still suspected, repeat the test at appropriate intervals.     Physical Findings: AIMS: Facial and Oral Movements Muscles of Facial Expression: None, normal Lips and Perioral Area: None, normal Jaw: None, normal Tongue: None, normal,Extremity Movements Upper (arms, wrists, hands, fingers): None, normal Lower (legs, knees, ankles, toes): None, normal, Trunk  Movements Neck, shoulders, hips: None, normal, Overall Severity Severity of abnormal movements (highest score from questions above): None, normal Incapacitation due to abnormal movements: None, normal Patient's awareness of abnormal movements (rate only patient's report): No Awareness, Dental Status Current problems with teeth and/or dentures?: No Does patient usually wear dentures?: No  CIWA:  CIWA-Ar Total: 1 COWS:  COWS Total Score: 1  Musculoskeletal: Strength & Muscle Tone: within normal limits Gait & Station: normal Patient leans: N/A  Psychiatric Specialty Exam: Review of Systems  Psychiatric/Behavioral: Positive for depression. Negative for suicidal ideas. The patient is nervous/anxious and has insomnia.   All other  systems reviewed and are negative. at this time denies  chest pain, no shortness of breath , describes sore throat, no fever, no chills   Blood pressure 137/83, pulse 72, temperature 98.2 F (36.8 C), temperature source Oral, resp. rate 18, height 6' (1.829 m), weight 190 lb (86.183 kg), SpO2 99 %.Body mass index is 25.76 kg/(m^2).  General Appearance: Casual  Eye Contact::  Good  Speech:  Clear and Coherent and Normal Rate  Volume:  Normal  Mood:   Becoming less depressed - anxious   Affect:   Improved range of affect, still anxious   Thought Process:  Coherent and Goal Directed  Orientation:  Full (Time, Place, and Person)  Thought Content:   Denies hallucinations, no delusions , ruminative about stressors as above   Suicidal Thoughts:  No- currently denies plan or intention of hurting self or of SI and contracts for safety on the unit   Homicidal Thoughts:  No  Memory:   Recent and remote grossly intact   Judgement:  Other:  improving   Insight:   improving  Psychomotor Activity:  normal  Concentration:  Good  Recall:  Good  Fund of Knowledge:Good  Language: Good  Akathisia:  No  Handed:    AIMS (if indicated):     Assets:  Communication Skills Desire for Improvement Resilience Social Support  ADL's:  improved  Cognition: WNL  Sleep:  Number of Hours: 5.75  Assessment - patient 's mood and affect improved, but describing significant anxiety as related to disposition planning and where he might go after discharge. At this time not having active chest pain, but has history of angina and describes intermittent fleeting episodes of chest pain . Currently tolerating medications well. No suicidal ideations. Treatment Plan Summary: Daily contact with patient to assess and evaluate symptoms and progress in treatment and Medication management  Medications:  Continue to encourage increased milieu participation to work on coping skills and ego strengths / symptom reduction. -Continue  nicotine patch for nicotine cravings  -continue Paxil 67m daily for  depression -Continue Ambien 541mqhs prn insomnia  -Continue cardiac and HIV meds as given outpatient  -Mucinex 60055mid for congestion  -Flonase nasal spray 10m17mid bilaterally   -Mobic 7.5mg 47m chronic pain CSW / treatment team working on disposition planning options  * As discussed with hospiScientist, physiologicalart ASA 325 mgrs QDAY and have patient go to ED for cardiac work up.   COBOSNeita Garnet  02/02/2015, 10:45PM

## 2015-02-02 NOTE — ED Notes (Signed)
Pt in xray

## 2015-02-02 NOTE — Progress Notes (Signed)
D. Pt had been up and visible in milieu this evening, did attend and participate in evening group activity. Pt spoke of his day and spoke how he felt somewhat better but still depressed. Pt also spoke of family issues that are a concern and has appeared depressed in the milieu. Pt did receive all medications without incident. A. Support provided. R. Safety maintained, will continue to monitor.

## 2015-02-02 NOTE — ED Notes (Signed)
Nathan Santana has been called by Network engineer

## 2015-02-02 NOTE — Tx Team (Signed)
Interdisciplinary Treatment Plan Update (Adult) Date: 02/02/2015   Date: 02/02/2015 9:12 AM  Progress in Treatment:  Attending groups: No Participating in groups: No Taking medication as prescribed: Yes  Tolerating medication: Yes  Family/Significant othe contact made: No, Pt declines Patient understands diagnosis: Yes Discussing patient identified problems/goals with staff: Yes  Medical problems stabilized or resolved: Yes  Denies suicidal/homicidal ideation: Yes Patient has not harmed self or Others: Yes   New problem(s) identified: None identified at this time.   Discharge Plan or Barriers: Pt is homeless, will have to look into shelter placement. CSW to assess for appropriate aftercare referrals  Additional comments: n/a   Reason for Continuation of Hospitalization:  Anxiety Depression Medication stabilization Suicidal ideation  Estimated length of stay: 3-5 days  Review of initial/current patient goals per problem list:   1.  Goal(s): Patient will participate in aftercare plan  Met:  No  Target date: 3-5 days from date of admission   As evidenced by: Patient will participate within aftercare plan AEB aftercare provider and housing plan at discharge being identified.  02/02/15: CSW to work with Pt to assess for appropriate discharge plan and faciliate appointments and referrals as needed prior to d/c.  2.  Goal (s): Patient will exhibit decreased depressive symptoms and suicidal ideations.  Met:  No  Target date: 3-5 days from date of admission   As evidenced by: Patient will utilize self rating of depression at 3 or below and demonstrate decreased signs of depression or be deemed stable for discharge by MD.  02/02/15: Pt rates depression at 10/10, expresses feeling hopeless.  3.  Goal(s): Patient will demonstrate decreased signs and symptoms of anxiety.  Met:  No  Target date: 3-5 days from date of admission   As evidenced by: Patient will utilize self  rating of anxiety at 3 or below and demonstrated decreased signs of anxiety, or be deemed stable for discharge by MD  02/02/15: Pt rates anxiety at 10/10; remains withdrawn in the milieu  Attendees:  Patient:    Family:    Physician: Dr. Parke Poisson, MD  02/02/2015 9:12 AM  Nursing: Lars Pinks, RN Case manager  02/02/2015 9:12 AM  Clinical Social Worker Norman Clay, MSW 02/02/2015 9:12 AM  Other: Lucinda Dell, [name] Sessions Liasion 02/02/2015 9:12 AM  Clinical: Cephus Richer, RN 02/02/2015 9:12 AM  Other: , RN Charge Nurse 02/02/2015 9:12 AM  Other:     [name], Latanya Presser MSW

## 2015-02-03 MED ORDER — ASPIRIN EC 81 MG PO TBEC
162.0000 mg | DELAYED_RELEASE_TABLET | Freq: Every day | ORAL | Status: DC
  Administered 2015-02-03 – 2015-02-05 (×3): 162 mg via ORAL
  Filled 2015-02-03 (×6): qty 2

## 2015-02-03 NOTE — Progress Notes (Signed)
D-  Patient stated that he is planning to discharge to a program at the [facility].  Patient states that he is worried about not being able to contact his sister.  Patient states that his depression is ok and that his anxiety is fair.  His only concern is about where he is going to go when he discharges.   He denies AVH and SI/HI.  Patient has been isolative to room this shift. Patient has denied any chest pains this shift and states as long as he can keep his anxiety down he is ok.   A- assess patient for safety, offer medications as prescribed, engaged Probation officer in 1:1 therapeutic staff talks,   R-  Patient was able to contract for safety this shift.  Patient reported a decrease in anxiety and depression this shift.

## 2015-02-03 NOTE — Progress Notes (Signed)
Patient ID: Cheryl Stabenow, male   DOB: 19-Aug-1960, 54 y.o.   MRN: [id number] [facility] MD Progress Note  02/03/2015  Jayten Gabbard  MRN:  [id number] Subjective:patient continues to ruminate about disposition options, but states is leaning towards going to a program at  " [employer] [employer] " . States he is motivated in sobriety, abstinence .   Objective:  Patient case discussed with treatment team and patient seen . Mood improving, but remains anxious and ruminative about disposition planning, as above  Yesterday went to ED due to brief episodes of chest discomfort- work up negative, troponin negative, so returned to unit. Was started on ASA as per hospitalist recommendation. At this time denies any chest pain and has no shortness of breath, does not appear to be in any acute distress. Denies medication side effects. Motivated in sobriety. Denies cravings at present . Focused on disposition options, states that returning to live with sister is not an option at this time.   Principal Problem: Major depressive disorder, recurrent, severe without psychotic features ([place]) Diagnosis:   Patient Active Problem List   Diagnosis Date Noted  . Severe recurrent major depression without psychotic features ([place]) [F33.2] 01/28/2015  . Major depressive disorder, recurrent, severe without psychotic features ([place]) [F33.2] 01/28/2015   Total Time spent with patient: 20 minutes  Past Medical History:  Past Medical History  Diagnosis Date  . HIV infection ([place])   . Cancer ([place])   . Hypertension   . Angina pectoris ([place])   . Scrotal cyst   . Anxiety   . Depression     Past Surgical History  Procedure Laterality Date  . Scrotal turmor removed     Family History:  Family History  Problem Relation Age of Onset  . Hypertension Other    Social History:  History  Alcohol Use  . 0.6 oz/week  . 1 Cans of beer per week    Comment: one beer weekly     History  Drug Use No    Comment: no drugs since 2008     Social History   Social History  . Marital Status: Single    Spouse Name: N/A  . Number of Children: N/A  . Years of Education: N/A   Social History Main Topics  . Smoking status: Current Some Day Smoker -- 0.50 packs/day    Types: Cigars  . Smokeless tobacco: None  . Alcohol Use: 0.6 oz/week    1 Cans of beer per week     Comment: one beer weekly  . Drug Use: No     Comment: no drugs since 2008  . Sexual Activity: No   Other Topics Concern  . None   Social History Narrative   Additional Social History:    Pain Medications: advil Prescriptions: norvasc  atripla   coreg   lisinopril   nitrostat   protonix   crestor Over the Counter: advil History of alcohol / drug use?: Yes Longest period of sobriety (when/how long): since 2008 Negative Consequences of Use: Financial, Scientist, research (physical sciences), Personal relationships, Work / School Withdrawal Symptoms: Other (Comment) (anxiety, depression, panic ) Name of Substance 1: alcohol 1 - Age of First Use: not sure 1 - Amount (size/oz): 1 beer weekly 1 - Frequency: 1 beer weekly 1 - Duration: ongoing 1 - Last Use / Amount: last week  Sleep: Good  Appetite:  Good  Current Medications: Current Facility-Administered Medications  Medication Dose Route Frequency Provider Last Rate Last Dose  . acetaminophen (TYLENOL) tablet 650  mg  650 mg Oral Q4H PRN Delfin Gant, NP   650 mg at 02/01/15 0846  . alum & mag hydroxide-simeth (MAALOX/MYLANTA) 200-200-20 MG/5ML suspension 30 mL  30 mL Oral PRN Delfin Gant, NP      . amLODipine (NORVASC) tablet 10 mg  10 mg Oral Daily Delfin Gant, NP   10 mg at 02/03/15 0852  . aspirin tablet 325 mg  325 mg Oral Daily Jenne Campus, MD   325 mg at 02/03/15 4008  . carvedilol (COREG) tablet 25 mg  25 mg Oral BID WC Delfin Gant, NP   25 mg at 02/03/15 0852  . efavirenz-emtricitabine-tenofovir (ATRIPLA) 600-200-300 MG per tablet 1 tablet  1 tablet Oral QHS Delfin Gant, NP   1  tablet at 02/02/15 2121  . feeding supplement (BOOST / RESOURCE BREEZE) liquid 1 Container  1 Container Oral TID BM [place], RD   1 Container at 02/03/15 1514  . fluticasone (FLONASE) 50 MCG/ACT nasal spray 1 spray  1 spray Each Nare BID Benjamine Mola, FNP   1 spray at 02/03/15 [phone]  . guaiFENesin (MUCINEX) 12 hr tablet 600 mg  600 mg Oral BID Benjamine Mola, FNP   600 mg at 02/03/15 9509  . lisinopril (PRINIVIL,ZESTRIL) tablet 10 mg  10 mg Oral Daily Delfin Gant, NP   10 mg at 02/03/15 0852  . LORazepam (ATIVAN) tablet 1 mg  1 mg Oral Q8H PRN Delfin Gant, NP   1 mg at 02/02/15 2121  . meloxicam (MOBIC) tablet 7.5 mg  7.5 mg Oral Daily Myer Peer Inna Tisdell, MD   7.5 mg at 02/03/15 0852  . nicotine (NICODERM CQ - dosed in mg/24 hours) patch 21 mg  21 mg Transdermal Daily Jenne Campus, MD   21 mg at 01/30/15 0843  . nitroGLYCERIN (NITROSTAT) SL tablet 0.4 mg  0.4 mg Sublingual Q5 min PRN Delfin Gant, NP   0.4 mg at 01/31/15 1923  . ondansetron (ZOFRAN) tablet 4 mg  4 mg Oral Q8H PRN Delfin Gant, NP      . pantoprazole (PROTONIX) EC tablet 40 mg  40 mg Oral Daily Delfin Gant, NP   40 mg at 02/03/15 0852  . PARoxetine (PAXIL) tablet 20 mg  20 mg Oral Daily Delfin Gant, NP   20 mg at 02/03/15 0852  . rosuvastatin (CRESTOR) tablet 20 mg  20 mg Oral q1800 Delfin Gant, NP   20 mg at 02/01/15 1822  . zolpidem (AMBIEN) tablet 5 mg  5 mg Oral QHS PRN Delfin Gant, NP   5 mg at 02/02/15 2122    Lab Results:  Results for orders placed or performed during the hospital encounter of 01/28/15 (from the past 48 hour(s))  CBC with Differential     Status: Abnormal   Collection Time: 02/02/15  2:05 PM  Result Value Ref Range   WBC 7.7 4.0 - 10.5 K/uL   RBC 3.74 (L) 4.22 - 5.81 MIL/uL   Hemoglobin 12.5 (L) 13.0 - 17.0 g/dL   HCT 36.7 (L) 39.0 - 52.0 %   MCV 98.1 78.0 - 100.0 fL   MCH 33.4 26.0 - 34.0 pg   MCHC 34.1 30.0 - 36.0 g/dL   RDW 11.7 11.5  - 15.5 %   Platelets 137 (L) 150 - 400 K/uL   Neutrophils Relative % 66 %   Neutro Abs 5.1 1.7 - 7.7 K/uL  Lymphocytes Relative 14 %   Lymphs Abs 1.1 0.7 - 4.0 K/uL   Monocytes Relative 17 %   Monocytes Absolute 1.3 (H) 0.1 - 1.0 K/uL   Eosinophils Relative 3 %   Eosinophils Absolute 0.2 0.0 - 0.7 K/uL   Basophils Relative 0 %   Basophils Absolute 0.0 0.0 - 0.1 K/uL  Basic metabolic panel     Status: Abnormal   Collection Time: 02/02/15  2:05 PM  Result Value Ref Range   Sodium 137 135 - 145 mmol/L   Potassium 4.4 3.5 - 5.1 mmol/L   Chloride 106 101 - 111 mmol/L   CO2 26 22 - 32 mmol/L   Glucose, Bld 114 (H) 65 - 99 mg/dL   BUN 16 6 - 20 mg/dL   Creatinine, Ser 0.98 0.61 - 1.24 mg/dL   Calcium 9.1 8.9 - 10.3 mg/dL   GFR calc non Af Amer >60 >60 mL/min   GFR calc Af Amer >60 >60 mL/min    Comment: (NOTE) The eGFR has been calculated using the CKD EPI equation. This calculation has not been validated in all clinical situations. eGFR's persistently <60 mL/min signify possible Chronic Kidney Disease.    Anion gap 5 5 - 15  I-Stat Troponin, ED (not at [facility])     Status: None   Collection Time: 02/02/15  2:13 PM  Result Value Ref Range   Troponin i, poc 0.01 0.00 - 0.08 ng/mL   Comment 3            Comment: Due to the release kinetics of cTnI, a negative result within the first hours of the onset of symptoms does not rule out myocardial infarction with certainty. If myocardial infarction is still suspected, repeat the test at appropriate intervals.     Physical Findings: AIMS: Facial and Oral Movements Muscles of Facial Expression: None, normal Lips and Perioral Area: None, normal Jaw: None, normal Tongue: None, normal,Extremity Movements Upper (arms, wrists, hands, fingers): None, normal Lower (legs, knees, ankles, toes): None, normal, Trunk Movements Neck, shoulders, hips: None, normal, Overall Severity Severity of abnormal movements (highest score from questions  above): None, normal Incapacitation due to abnormal movements: None, normal Patient's awareness of abnormal movements (rate only patient's report): No Awareness, Dental Status Current problems with teeth and/or dentures?: No Does patient usually wear dentures?: No  CIWA:  CIWA-Ar Total: 1 COWS:  COWS Total Score: 1  Musculoskeletal: Strength & Muscle Tone: within normal limits Gait & Station: normal Patient leans: N/A  Psychiatric Specialty Exam: Review of Systems  Psychiatric/Behavioral: Positive for depression. Negative for suicidal ideas. The patient is nervous/anxious and has insomnia.   All other systems reviewed and are negative. at this time denies  chest pain, no shortness of breath , describes sore throat, no fever, no chills   Blood pressure 149/92, pulse 80, temperature 98.2 F (36.8 C), temperature source Oral, resp. rate 18, height 6' (1.829 m), weight 190 lb (86.183 kg), SpO2 99 %.Body mass index is 25.76 kg/(m^2).  General Appearance: Casual  Eye Contact::  Good  Speech:  Clear and Coherent and Normal Rate  Volume:  Normal  Mood:   Improved , less depressed   Affect:   More reactive, smiles at times appropriately, still anxious about disposition plans   Thought Process:  Coherent and Goal Directed  Orientation:  Full (Time, Place, and Person)  Thought Content:   Denies hallucinations, no delusions , ruminative about stressors as above   Suicidal Thoughts:  No- currently denies  plan or intention of hurting self or of SI and contracts for safety on the unit   Homicidal Thoughts:  No  Memory:   Recent and remote grossly intact   Judgement:  Other:  improving   Insight:   improving  Psychomotor Activity:  normal  Concentration:  Good  Recall:  Good  Fund of Knowledge:Good  Language: Good  Akathisia:  No  Handed:    AIMS (if indicated):     Assets:  Communication Skills Desire for Improvement Resilience Social Support  ADL's:  improved  Cognition: WNL  Sleep:   Number of Hours: 6.5  Assessment -  Patient improving gradually, less depressed, future oriented, remains anxious and ruminative about disposition options. Staff / Probation officer have encouraged him to consider Rehab or [facility], but he has expressed concern about  Financial aspect of these . He does report he is motivated in sobriety, and is interested in going to a program, as above. No current SI. Denies medication side effects. Today no chest pain and presents calm, in no acute distress  Treatment Plan Summary: Daily contact with patient to assess and evaluate symptoms and progress in treatment and Medication management  Medications:  Continue to encourage increased milieu participation to work on coping skills and ego strengths / symptom reduction. -Continue nicotine patch for nicotine cravings  -continue Paxil 29m daily for  depression -Continue Ambien 567mqhs prn insomnia  -Continue cardiac and HIV meds as given outpatient  -Mucinex 60043mid for congestion  -Flonase nasal spray 12m10mid bilaterally   -D/c Mobic  For now, as  Currently  on ASA , to minimize potential side effects/GI symptoms. CSW / treatment team working on disposition planning options  Change ASA to 81 mgrs daily   COBONeita GarnetD  02/03/2015, 10:45PM

## 2015-02-03 NOTE — Progress Notes (Signed)
Adult Psychoeducational Group Note  Date:  02/03/2015 Time:  9:17 PM  Group Topic/Focus:  Wrap-Up Group:   The focus of this group is to help patients review their daily goal of treatment and discuss progress on daily workbooks.  Participation Level:  Active  Participation Quality:  Appropriate  Affect:  Appropriate  Cognitive:  Alert  Insight: Appropriate  Engagement in Group:  Engaged  Modes of Intervention:  Discussion  Additional Comments:  Patient shared a lot in group today. Overall patient stated he had a good day. Patient stated he was able to have a talk with his sister.   Jakaiya Netherland L Zidane Renner 02/03/2015, 9:17 PM

## 2015-02-03 NOTE — BHH Group Notes (Signed)
[facility] LCSW Aftercare Discharge Planning Group Note  02/03/2015 8:45 AM  Participation Quality: Alert, Appropriate and Oriented  Mood/Affect: Appropriate  Depression Rating: "I don't have a number- in the middle"  Anxiety Rating: "I don't have a number- in the middle"  Thoughts of Suicide: Pt denies SI/HI  Will you contract for safety? Yes  Current AVH: Pt denies  Plan for Discharge/Comments: Pt attended discharge planning group and actively participated in group. CSW discussed suicide prevention education with the group and encouraged them to discuss discharge planning and any relevant barriers. Pt reports continued congestion; continues to have difficulty articulating his feelings.   Transportation Means: Pt reports access to transportation  Supports: No supports mentioned at this time  [name], [place] 02/03/2015 9:37 AM

## 2015-02-04 NOTE — Tx Team (Signed)
Interdisciplinary Treatment Plan Update (Adult) Date: 02/04/2015   Date: 02/04/2015 1:31 PM  Progress in Treatment:  Attending groups: Intermittently Participating in groups: No Taking medication as prescribed: Yes  Tolerating medication: Yes  Family/Significant othe contact made: No, Pt declines Patient understands diagnosis: Yes Discussing patient identified problems/goals with staff: Yes  Medical problems stabilized or resolved: Yes  Denies suicidal/homicidal ideation: No, Pt endorses passive SI Patient has not harmed self or Others: Yes   New problem(s) identified: None identified at this time.   Discharge Plan or Barriers: Pt is homeless, will have to look into shelter placement. CSW to assess for appropriate aftercare referrals  02/04/15: Pt continues to be undecided about discharge plan. CSW has provided Pt with [employer], list of boarding houses, and offered coordinated intake at the [facility]. Pt currently declining those offers.  Additional comments: n/a   Reason for Continuation of Hospitalization:  Anxiety Depression Medication stabilization Suicidal ideation  Estimated length of stay: 2-3 days  Review of initial/current patient goals per problem list:   1.  Goal(s): Patient will participate in aftercare plan  Met:  No  Target date: 3-5 days from date of admission   As evidenced by: Patient will participate within aftercare plan AEB aftercare provider and housing plan at discharge being identified.  02/02/15: CSW to work with Pt to assess for appropriate discharge plan and faciliate appointments and referrals as needed prior to d/c. 02/04/15: CSW continues to assess for appropriate referrals.  2.  Goal (s): Patient will exhibit decreased depressive symptoms and suicidal ideations.  Met:  No  Target date: 3-5 days from date of admission   As evidenced by: Patient will utilize self rating of depression at 3 or below and demonstrate decreased signs  of depression or be deemed stable for discharge by MD.  02/02/15: Pt rates depression at 10/10, expresses feeling hopeless.  02/04/15: Pt continues to rate depression highly; endorses passive SI  3.  Goal(s): Patient will demonstrate decreased signs and symptoms of anxiety.  Met:  No  Target date: 3-5 days from date of admission   As evidenced by: Patient will utilize self rating of anxiety at 3 or below and demonstrated decreased signs of anxiety, or be deemed stable for discharge by MD  02/02/15: Pt rates anxiety at 10/10; remains withdrawn in the milieu  02/04/15: Pt rates anxiety highly; observed to be withdrawn on the unit.  Attendees:  Patient:    Family:    Physician: Dr. Parke Poisson, MD  02/04/2015 1:31 PM  Nursing: Lars Pinks, RN Case manager  02/04/2015 1:31 PM  Clinical Social Worker Norman Clay, MSW 02/04/2015 1:31 PM  Other: Lucinda Dell, [name] Sessions Liasion 02/04/2015 1:31 PM  Clinical: Marcella Dubs, RN 02/04/2015 1:31 PM  Other: , RN Charge Nurse 02/04/2015 1:31 PM  Other:     [name], [place] MSW

## 2015-02-04 NOTE — BHH Group Notes (Signed)
Adult Psychoeducational Group Note  Date:  02/04/2015 Time:  9:23 PM  Group Topic/Focus:  Wrap-Up Group:   The focus of this group is to help patients review their daily goal of treatment and discuss progress on daily workbooks.  Participation Level:  None  Participation Quality:  Attentive  Affect:  Flat  Cognitive:  Alert  Insight: None  Engagement in Group:  None  Modes of Intervention:  Education  Additional Comments:  Patient did not want to participate.  He did express that his day was "liveable".  Victorino Sparrow A 02/04/2015, 9:23 PM

## 2015-02-04 NOTE — BHH Group Notes (Signed)
[facility] Mental Health Association Group Therapy 02/04/2015 1:15pm  Type of Therapy: Mental Health Association Presentation  Participation Level: Active  Participation Quality: Attentive  Affect: Appropriate  Cognitive: Oriented  Insight: Developing/Improving  Engagement in Therapy: Engaged  Modes of Intervention: Discussion, Education and Socialization  Summary of Progress/Problems: Mental Health Association ([place]) Speaker came to talk about his personal journey with substance abuse and addiction. The pt processed ways by which to relate to the speaker. [place] speaker provided handouts and educational information pertaining to groups and services offered by the [facility]. Pt was engaged in speaker's presentation and was receptive to resources provided.    [name], LCSWA 02/04/2015 1:35 PM

## 2015-02-04 NOTE — Progress Notes (Signed)
   D: When asked about his day pt stated, "I'm all right. It's a different world". Informed the writer that he relapsed after 8 yrs of sobriety. Stated, "honestly, I'm ready to go". States he plans to go to [facility] and participate in a program. Pt stated he's ready to get himself together. Pt has no other questions or concerns.    A:  Support and encouragement was offered. 15 min checks continued for safety.  R: Pt remains safe.

## 2015-02-04 NOTE — BHH Group Notes (Signed)

## 2015-02-04 NOTE — Progress Notes (Signed)
Patient ID: Nathan Santana, male   DOB: [birth year], 54 y.o.   MRN: [id number] [facility] MD Progress Note  02/04/2015  Psalms Garringer  MRN:  [id number] Subjective:patient continues to worry and ruminate about disposition options/ discharge plans. He repeatedly brings up issues such as how much different options might cost, and whether he would be able to afford his car and gas . He states he is currently wanting to go to  [facility], but worries about " not even being able to get there  Because I have no gas ". States that thinking of all these challenges , difficulties causes him to feel discouraged and anxious .   Objective:  Patient case discussed with treatment team and patient seen . In general improving compared to admission, but quite ruminative and anxious about disposition plans as above. His major concerns are financial, stating that if he goes to [facility], for example, he would not be able to pay for his car. He tends to focus on negative issue and to ruminate anxiously. As per Nursing Staff/ chart notes, has indicated significant anxiety, and passive SI, although denies any actual plan or intention of hurting self at this time and is clearly future oriented . No medication side effects. Visible in day room, behavior in good control. Of note, denies cravings for cocaine, which he has identified as substance of choice.   Principal Problem: Major depressive disorder, recurrent, severe without psychotic features ([place]) Diagnosis:   Patient Active Problem List   Diagnosis Date Noted  . Severe recurrent major depression without psychotic features ([place]) [F33.2] 01/28/2015  . Major depressive disorder, recurrent, severe without psychotic features ([place]) [F33.2] 01/28/2015   Total Time spent with patient: 20 minutes  Past Medical History:  Past Medical History  Diagnosis Date  . HIV infection ([place])   . Cancer ([place])   . Hypertension   . Angina pectoris ([place])   . Scrotal cyst   .  Anxiety   . Depression     Past Surgical History  Procedure Laterality Date  . Scrotal turmor removed     Family History:  Family History  Problem Relation Age of Onset  . Hypertension Other    Social History:  History  Alcohol Use  . 0.6 oz/week  . 1 Cans of beer per week    Comment: one beer weekly     History  Drug Use No    Comment: no drugs since 2008    Social History   Social History  . Marital Status: Single    Spouse Name: N/A  . Number of Children: N/A  . Years of Education: N/A   Social History Main Topics  . Smoking status: Current Some Day Smoker -- 0.50 packs/day    Types: Cigars  . Smokeless tobacco: None  . Alcohol Use: 0.6 oz/week    1 Cans of beer per week     Comment: one beer weekly  . Drug Use: No     Comment: no drugs since 2008  . Sexual Activity: No   Other Topics Concern  . None   Social History Narrative   Additional Social History:    Pain Medications: advil Prescriptions: norvasc  atripla   coreg   lisinopril   nitrostat   protonix   crestor Over the Counter: advil History of alcohol / drug use?: Yes Longest period of sobriety (when/how long): since 2008 Negative Consequences of Use: Financial, Scientist, research (physical sciences), Personal relationships, Work / School Withdrawal Symptoms: Other (Comment) (anxiety,  depression, panic ) Name of Substance 1: alcohol 1 - Age of First Use: not sure 1 - Amount (size/oz): 1 beer weekly 1 - Frequency: 1 beer weekly 1 - Duration: ongoing 1 - Last Use / Amount: last week  Sleep:  Fair   Appetite:  Good  Current Medications: Current Facility-Administered Medications  Medication Dose Route Frequency Provider Last Rate Last Dose  . acetaminophen (TYLENOL) tablet 650 mg  650 mg Oral Q4H PRN Delfin Gant, NP   650 mg at 02/01/15 0846  . alum & mag hydroxide-simeth (MAALOX/MYLANTA) 200-200-20 MG/5ML suspension 30 mL  30 mL Oral PRN Delfin Gant, NP      . amLODipine (NORVASC) tablet 10 mg  10 mg Oral  Daily Delfin Gant, NP   10 mg at 02/04/15 0831  . aspirin EC tablet 162 mg  162 mg Oral Daily Jenne Campus, MD   162 mg at 02/04/15 0830  . carvedilol (COREG) tablet 25 mg  25 mg Oral BID WC Delfin Gant, NP   25 mg at 02/04/15 1655  . efavirenz-emtricitabine-tenofovir (ATRIPLA) 600-200-300 MG per tablet 1 tablet  1 tablet Oral QHS Delfin Gant, NP   1 tablet at 02/03/15 2142  . feeding supplement (BOOST / RESOURCE BREEZE) liquid 1 Container  1 Container Oral TID BM [place], RD   1 Container at 02/04/15 1500  . fluticasone (FLONASE) 50 MCG/ACT nasal spray 1 spray  1 spray Each Nare BID Benjamine Mola, FNP   1 spray at 02/04/15 0830  . guaiFENesin (MUCINEX) 12 hr tablet 600 mg  600 mg Oral BID Benjamine Mola, FNP   600 mg at 02/04/15 1655  . lisinopril (PRINIVIL,ZESTRIL) tablet 10 mg  10 mg Oral Daily Delfin Gant, NP   10 mg at 02/04/15 0831  . LORazepam (ATIVAN) tablet 1 mg  1 mg Oral Q8H PRN Delfin Gant, NP   1 mg at 02/03/15 2337  . nicotine (NICODERM CQ - dosed in mg/24 hours) patch 21 mg  21 mg Transdermal Daily Jenne Campus, MD   21 mg at 01/30/15 0843  . nitroGLYCERIN (NITROSTAT) SL tablet 0.4 mg  0.4 mg Sublingual Q5 min PRN Delfin Gant, NP   0.4 mg at 02/04/15 1453  . ondansetron (ZOFRAN) tablet 4 mg  4 mg Oral Q8H PRN Delfin Gant, NP      . pantoprazole (PROTONIX) EC tablet 40 mg  40 mg Oral Daily Delfin Gant, NP   40 mg at 02/04/15 0831  . PARoxetine (PAXIL) tablet 20 mg  20 mg Oral Daily Delfin Gant, NP   20 mg at 02/04/15 0831  . rosuvastatin (CRESTOR) tablet 20 mg  20 mg Oral q1800 Delfin Gant, NP   20 mg at 02/03/15 1700  . zolpidem (AMBIEN) tablet 5 mg  5 mg Oral QHS PRN Delfin Gant, NP   5 mg at 02/03/15 2142    Lab Results:  No results found for this or any previous visit (from the past 48 hour(s)).  Physical Findings: AIMS: Facial and Oral Movements Muscles of Facial Expression: None,  normal Lips and Perioral Area: None, normal Jaw: None, normal Tongue: None, normal,Extremity Movements Upper (arms, wrists, hands, fingers): None, normal Lower (legs, knees, ankles, toes): None, normal, Trunk Movements Neck, shoulders, hips: None, normal, Overall Severity Severity of abnormal movements (highest score from questions above): None, normal Incapacitation due to abnormal movements: None, normal Patient's awareness  of abnormal movements (rate only patient's report): No Awareness, Dental Status Current problems with teeth and/or dentures?: No Does patient usually wear dentures?: No  CIWA:  CIWA-Ar Total: 1 COWS:  COWS Total Score: 1  Musculoskeletal: Strength & Muscle Tone: within normal limits Gait & Station: normal Patient leans: N/A  Psychiatric Specialty Exam: Review of Systems  Psychiatric/Behavioral: Positive for depression. Negative for suicidal ideas. The patient is nervous/anxious and has insomnia.   All other systems reviewed and are negative. at this time denies  chest pain, no shortness of breath , describes sore throat, no fever, no chills   Blood pressure 137/87, pulse 65, temperature 98 F (36.7 C), temperature source Oral, resp. rate 20, height 6' (1.829 m), weight 190 lb (86.183 kg), SpO2 99 %.Body mass index is 25.76 kg/(m^2).  General Appearance: Casual  Eye Contact::  Good  Speech:  Clear and Coherent and Normal Rate  Volume:  Normal  Mood:    Anxious, still somewhat depressed  Affect:   Reactive, but anxious   Thought Process:  Coherent and Goal Directed  Orientation:  Full (Time, Place, and Person)  Thought Content:   Denies hallucinations, no delusions , ruminative about stressors as above   Suicidal Thoughts:  Did endorse some passive SI earlier, but denies any actual plan or intention of hurting self or of SI and contracts for safety on the unit   Homicidal Thoughts:  No  Memory:   Recent and remote grossly intact   Judgement:  Other:   improving   Insight:   improving  Psychomotor Activity:  normal  Concentration:  Good  Recall:  Good  Fund of Knowledge:Good  Language: Good  Akathisia:  No  Handed:    AIMS (if indicated):     Assets:  Communication Skills Desire for Improvement Resilience Social Support  ADL's:  improved  Cognition: WNL  Sleep:  Number of Hours: 5  Assessment -   As patient improves and approaches discharge, he has become increasingly anxious , ruminative about discharge options, tending to see these as impacting his financial situation negatively. Ruminations,in turn, have caused him to feel more depressed again. However, he remains responsive to support, encouragement, is future oriented, and at this time denies any suicidal ideations. Tolerating medications well. No chest pains  Reported   today . Treatment Plan Summary: Daily contact with patient to assess and evaluate symptoms and progress in treatment and Medication management  Medications:  Continue to encourage increased milieu participation to work on coping skills and ego strengths / symptom reduction. -Continue nicotine patch for nicotine cravings  -continue Paxil 20mg  daily for  depression -Continue Ambien 5mg  qhs prn insomnia  -Continue cardiac and HIV meds as given outpatient  -Mucinex 600mg  bid for congestion  -Flonase nasal spray 86mcg bid bilaterally   CSW / treatment team working on disposition planning options  Continue  ASA  81 mgrs daily   Kailene Steinhart, Felicita Gage,  MD  02/04/2015, 10:45PM

## 2015-02-04 NOTE — Progress Notes (Signed)
Patient ID: Nathan Santana, male   DOB: 1961-02-14, 54 y.o.   MRN: [id number]   Pt currently presents with a flat affect and depressed behavior. Per self inventory, pt rates depression at a 7, hopelessness 7 and anxiety 6. Pt's daily goal is to "working on where to go when discharge" and they intend to do so by "not sure." Pt reports poor sleep, a poor appetite, low energy and poor concentration. Pt attends groups, including an AA group upon request today. Pt says he is having a "bad day today" and reports pain in his groin and irritability.   Pt provided with medications per providers orders. Pt's labs and vitals were monitored throughout the day. Pt supported emotionally and encouraged to express concerns and questions. Pt educated on medications and suicidal prevention precautions.   Pt's safety ensured with 15 minute and environmental checks. Pt currently denies HI and A/V hallucinations. Pt verbally agrees to seek staff if HI or A/VH occurs. Pt currently endorses SI on his self inventory report but denies SI to Probation officer. Denies any specific plan. Pt verbally agrees to consult with staff before acting on these thoughts. In regards to his discharge plan pt writes "I'm not sure where to go and beggining to feel fearful again." Will continue POC.

## 2015-02-04 NOTE — Progress Notes (Signed)
Patient ID: Nathan Santana, male   DOB: [birth year], 54 y.o.   MRN: [id number] D: Patient in dayroom on approach. Pt reports increase anxiety over discharge plans.  Pt mood and affect appeared depressed and anxious. Pt reports he is tolerating medication well. Pt denies SI/HI/AVH and chest pain. Pt attended and participated in evening wrap up group. Cooperative with assessment.    A: Met with pt 1:1. Medications administered as prescribed. Support and encouragement provided. Pt encouraged to discuss feelings and come to staff with any question or concerns.   R: Patient remains safe and complaint with medications. Pt reports he is feeling better after hearing his dog bark on the phone.

## 2015-02-05 ENCOUNTER — Observation Stay (HOSPITAL_COMMUNITY)
Admission: EM | Admit: 2015-02-05 | Discharge: 2015-02-10 | Disposition: A | Discharge status: Home / Self Care | Payer: Medicare Other | Attending: Internal Medicine | Admitting: Internal Medicine

## 2015-02-05 ENCOUNTER — Other Ambulatory Visit: Payer: Self-pay

## 2015-02-05 ENCOUNTER — Emergency Department (HOSPITAL_COMMUNITY): Payer: Medicare Other

## 2015-02-05 ENCOUNTER — Encounter (HOSPITAL_COMMUNITY): Payer: Self-pay | Admitting: Internal Medicine

## 2015-02-05 DIAGNOSIS — R079 Chest pain, unspecified: Secondary | ICD-10-CM | POA: Diagnosis not present

## 2015-02-05 DIAGNOSIS — Z21 Asymptomatic human immunodeficiency virus [HIV] infection status: Secondary | ICD-10-CM | POA: Diagnosis present

## 2015-02-05 DIAGNOSIS — I25119 Atherosclerotic heart disease of native coronary artery with unspecified angina pectoris: Secondary | ICD-10-CM

## 2015-02-05 DIAGNOSIS — F329 Major depressive disorder, single episode, unspecified: Secondary | ICD-10-CM | POA: Diagnosis not present

## 2015-02-05 DIAGNOSIS — R45851 Suicidal ideations: Secondary | ICD-10-CM | POA: Insufficient documentation

## 2015-02-05 DIAGNOSIS — Z79899 Other long term (current) drug therapy: Secondary | ICD-10-CM | POA: Insufficient documentation

## 2015-02-05 DIAGNOSIS — Z7982 Long term (current) use of aspirin: Secondary | ICD-10-CM | POA: Insufficient documentation

## 2015-02-05 DIAGNOSIS — Z590 Homelessness: Secondary | ICD-10-CM | POA: Insufficient documentation

## 2015-02-05 DIAGNOSIS — F332 Major depressive disorder, recurrent severe without psychotic features: Secondary | ICD-10-CM | POA: Insufficient documentation

## 2015-02-05 DIAGNOSIS — N50819 Testicular pain, unspecified: Secondary | ICD-10-CM | POA: Diagnosis present

## 2015-02-05 DIAGNOSIS — F419 Anxiety disorder, unspecified: Secondary | ICD-10-CM | POA: Diagnosis not present

## 2015-02-05 DIAGNOSIS — I10 Essential (primary) hypertension: Secondary | ICD-10-CM | POA: Insufficient documentation

## 2015-02-05 DIAGNOSIS — Z8249 Family history of ischemic heart disease and other diseases of the circulatory system: Secondary | ICD-10-CM | POA: Insufficient documentation

## 2015-02-05 DIAGNOSIS — B20 Human immunodeficiency virus [HIV] disease: Secondary | ICD-10-CM | POA: Diagnosis present

## 2015-02-05 DIAGNOSIS — I2510 Atherosclerotic heart disease of native coronary artery without angina pectoris: Secondary | ICD-10-CM | POA: Diagnosis present

## 2015-02-05 DIAGNOSIS — I4510 Unspecified right bundle-branch block: Secondary | ICD-10-CM | POA: Insufficient documentation

## 2015-02-05 DIAGNOSIS — N50811 Right testicular pain: Secondary | ICD-10-CM | POA: Insufficient documentation

## 2015-02-05 DIAGNOSIS — F32A Depression, unspecified: Secondary | ICD-10-CM | POA: Insufficient documentation

## 2015-02-05 LAB — CBC
HCT: 40.1 % (ref 39.0–52.0)
Hemoglobin: 13.2 g/dL (ref 13.0–17.0)
MCH: 33.2 pg (ref 26.0–34.0)
MCHC: 32.9 g/dL (ref 30.0–36.0)
MCV: 100.8 fL — ABNORMAL HIGH (ref 78.0–100.0)
PLATELETS: 187 10*3/uL (ref 150–400)
RBC: 3.98 MIL/uL — ABNORMAL LOW (ref 4.22–5.81)
RDW: 12 % (ref 11.5–15.5)
WBC: 7.3 10*3/uL (ref 4.0–10.5)

## 2015-02-05 LAB — BASIC METABOLIC PANEL
Anion gap: 8 (ref 5–15)
BUN: 24 mg/dL — AB (ref 6–20)
CALCIUM: 9.4 mg/dL (ref 8.9–10.3)
CO2: 27 mmol/L (ref 22–32)
CREATININE: 0.96 mg/dL (ref 0.61–1.24)
Chloride: 106 mmol/L (ref 101–111)
GFR calc Af Amer: >60 mL/min (ref >60)
GLUCOSE: 108 mg/dL — AB (ref 65–99)
POTASSIUM: 4 mmol/L (ref 3.5–5.1)
SODIUM: 141 mmol/L (ref 135–145)

## 2015-02-05 LAB — RAPID URINE DRUG SCREEN, HOSP PERFORMED
AMPHETAMINES: NOT DETECTED
Barbiturates: NOT DETECTED
Benzodiazepines: NOT DETECTED
Cocaine: NOT DETECTED
OPIATES: NOT DETECTED
Tetrahydrocannabinol: NOT DETECTED

## 2015-02-05 LAB — TROPONIN I
Troponin I: <0.03 ng/mL (ref <0.031)
Troponin I: <0.03 ng/mL (ref <0.031)

## 2015-02-05 LAB — ETHANOL

## 2015-02-05 MED ORDER — ZOLPIDEM TARTRATE 5 MG PO TABS: 5.0000 mg | ORAL_TABLET | Freq: Every evening | ORAL | Status: DC | PRN

## 2015-02-05 MED ORDER — ASPIRIN 81 MG PO CHEW
324.0000 mg | CHEWABLE_TABLET | Freq: Once | ORAL | Status: AC
  Administered 2015-02-05: 324 mg via ORAL
  Filled 2015-02-05: qty 4

## 2015-02-05 MED ORDER — PAROXETINE HCL 20 MG PO TABS: 20.0000 mg | ORAL_TABLET | Freq: Every day | ORAL | Status: DC

## 2015-02-05 MED ORDER — ATRIPLA 600-200-300 MG PO TABS: ORAL_TABLET | ORAL | Status: DC

## 2015-02-05 MED ORDER — NITROGLYCERIN 2 % TD OINT
1.0000 [in_us] | TOPICAL_OINTMENT | Freq: Once | TRANSDERMAL | Status: AC
  Administered 2015-02-05: 1 [in_us] via TOPICAL
  Filled 2015-02-05: qty 30

## 2015-02-05 MED ORDER — LISINOPRIL 10 MG PO TABS: 10.0000 mg | ORAL_TABLET | Freq: Every day | ORAL | Status: DC

## 2015-02-05 MED ORDER — CARVEDILOL 25 MG PO TABS: 25.0000 mg | ORAL_TABLET | Freq: Two times a day (BID) | ORAL | Status: DC

## 2015-02-05 MED ORDER — ROSUVASTATIN CALCIUM 20 MG PO TABS: 20.0000 mg | ORAL_TABLET | Freq: Every day | ORAL | Status: DC

## 2015-02-05 MED ORDER — ASPIRIN 162 MG PO TBEC: 162.0000 mg | DELAYED_RELEASE_TABLET | Freq: Every day | ORAL | Status: DC

## 2015-02-05 MED ORDER — NICOTINE 21 MG/24HR TD PT24: 21.0000 mg | MEDICATED_PATCH | Freq: Every day | TRANSDERMAL | Status: DC

## 2015-02-05 MED ORDER — AMLODIPINE BESYLATE 10 MG PO TABS: 10.0000 mg | ORAL_TABLET | Freq: Every day | ORAL | Status: DC

## 2015-02-05 MED ORDER — FLUTICASONE PROPIONATE 50 MCG/ACT NA SUSP: 1.0000 | Freq: Two times a day (BID) | NASAL | Status: DC

## 2015-02-05 MED ORDER — PANTOPRAZOLE SODIUM 40 MG PO TBEC: 40.0000 mg | DELAYED_RELEASE_TABLET | Freq: Every day | ORAL | Status: DC

## 2015-02-05 MED ORDER — NITROSTAT 0.4 MG SL SUBL: 0.4000 mg | SUBLINGUAL_TABLET | SUBLINGUAL | Status: DC | PRN

## 2015-02-05 NOTE — BHH Suicide Risk Assessment (Signed)
[facility] Discharge Suicide Risk Assessment   Demographic Factors:  54 year old man, currently homeless.  Total Time spent with patient: 30 minutes  Musculoskeletal: Strength & Muscle Tone: within normal limits Gait & Station: normal Patient leans: N/A  Psychiatric Specialty Exam: Physical Exam  ROS  Blood pressure 148/94, pulse 76, temperature 98.1 F (36.7 C), temperature source Oral, resp. rate 18, height 6' (1.829 m), weight 190 lb (86.183 kg), SpO2 99 %.Body mass index is 25.76 kg/(m^2).  General Appearance: Fairly Groomed  Engineer, water::  Good  Speech:  Normal Rate409  Volume:  Normal  Mood:  improved compared to admission, at this time denies significant depression  Affect:  Appropriate and fully reactive, still reporting some anxiety, which he attributes to transitioning out of inpatient unit   Thought Process:  Goal Directed and Linear  Orientation:  Full (Time, Place, and Person)  Thought Content:  denies hallucinations, no delusions  Suicidal Thoughts:  No- denies any suicidal ideations, denies any self injurious ideations  Homicidal Thoughts:  No  Memory:  recent and remote grossly intact   Judgement:  Other:  improved  Insight:  Present  Psychomotor Activity:  Normal  Concentration:  Good  Recall:  Good  Fund of Knowledge:Good  Language: Good  Akathisia:  Negative  Handed:  Right  AIMS (if indicated):     Assets:  Communication Skills Desire for Improvement Resilience  Sleep:  Number of Hours: 6.25  Cognition: WNL  ADL's:  Intact   Have you used any form of tobacco in the last 30 days? (Cigarettes, Smokeless Tobacco, Cigars, and/or Pipes): Yes  Has this patient used any form of tobacco in the last 30 days? (Cigarettes, Smokeless Tobacco, Cigars, and/or Pipes) Yes, A prescription for an FDA-approved tobacco cessation medication was offered at discharge and the patient refused  Mental Status Per Nursing Assessment::   On Admission:  Suicidal ideation indicated  by patient, Self-harm thoughts  Current Mental Status by Physician: At this time patient is significantly improved compared to admission- mood is improved, affect is brighter. He does continue to report some anxiety about leaving hospital, but states he feels stronger in his recovery at this time, and denies any current cravings or desire to use . Affect is more reactive and smiles at times appropriately. No thought disorder,  Denies suicidal ideations, no homicidal ideations, no hallucinations, no delusions, future oriented, and focused on disposition plans .  Loss Factors: Unemployment, limited support network, homelessness   Historical Factors: History of substance abuse ( cocaine ), had recently relapsed after a long period of sobriety, history of depression and prior psychiatric admissions.  Risk Reduction Factors:   Sense of responsibility to family, Positive social support and Positive coping skills or problem solving skills  Continued Clinical Symptoms:  As noted , currently significantly improved compared to admission, less depressed, fuller range of affect, no SI, still describing some anxiety, but responsive to support, encouragement.  Cognitive Features That Contribute To Risk:  No gross cognitive deficits noted upon discharge. Is alert , attentive, and oriented x 3    Suicide Risk:  Mild:  Suicidal ideation of limited frequency, intensity, duration, and specificity.  There are no identifiable plans, no associated intent, mild dysphoria and related symptoms, good self-control (both objective and subjective assessment), few other risk factors, and identifiable protective factors, including available and accessible social support.  Principal Problem: Major depressive disorder, recurrent, severe without psychotic features [facility]) Discharge Diagnoses:  Patient Active Problem List  Diagnosis Date Noted  . Severe recurrent major depression without psychotic features ([place]) [F33.2]  01/28/2015  . Major depressive disorder, recurrent, severe without psychotic features ([place]) [F33.2] 01/28/2015    Follow-up Information    Follow up with [facility] On 03/01/2015.   Specialty:  Internal Medicine   Why:  at 2:15pm with [place] Hollis. This will be your primary care provider.   Contact information:   [place] [phone]      Follow up with [facility].   Specialty:  Behavioral Health   Why:  You may walk-in Monday-Friday between 8am-3pm within 7 days of your discharge to be seen by a doctor and therapist. Please arrive early to avoid long wait times. Inform staff that you need a hospital discharge. A staff member from TCT will contact yo.   Contact information:   [place] [address] [phone]       Plan Of Care/Follow-up recommendations:  Activity:  as tolerated  Diet:  regular Tests:  NA Other:  see below   Is patient on multiple antipsychotic therapies at discharge:  No   Has Patient had three or more failed trials of antipsychotic monotherapy by history:  No  Recommended Plan for Multiple Antipsychotic Therapies: NA  Patient is leaving unit in good spirits. Plans to go to [facility] to work on Music therapist . Plans to follow up at [facility] for ongoing outpatient psychiatric care  He has been referred for [facility] management as well Plans to follow up with Internal Medicine as above to continue treatment of chronic medical issues   Alaisa Moffitt 02/05/2015, 12:41 PM

## 2015-02-05 NOTE — Progress Notes (Addendum)
Nathan Santana is prepared  For his DC as the physician completes his DC order and DC SRA . Pt completed his daily assessment and on it he wrote he  Has had SI today and he contracts for safety with this Probation officer and says he can "Stay safe" and he rated his depression , anxiety and hopelessness " 8/8/7" , respectively. He is given his DC AVS and it is discussed and reviewed with him and he states understanding . All belongings are returned to him and  He was given $8.00 ( to put gas in his car per SW ) andhe is escorted to bldg entrance and dc'd.

## 2015-02-05 NOTE — Tx Team (Signed)
Interdisciplinary Treatment Plan Update (Adult) Date: 02/05/2015   Date: 02/05/2015 2:50 PM  Progress in Treatment:  Attending groups: Intermittently Participating in groups: No Taking medication as prescribed: Yes  Tolerating medication: Yes  Family/Significant othe contact made: No, Pt declines Patient understands diagnosis: Yes Discussing patient identified problems/goals with staff: Yes  Medical problems stabilized or resolved: Yes  Denies suicidal/homicidal ideation: Yes Patient has not harmed self or Others: Yes   New problem(s) identified: None identified at this time.   Discharge Plan or Barriers: Pt is homeless, will have to look into shelter placement. CSW to assess for appropriate aftercare referrals  02/04/15: Pt continues to be undecided about discharge plan. CSW has provided Pt with [employer], list of boarding houses, and offered coordinated intake at the [facility]. Pt currently declining those offers.  02/05/15: Pt plans to DC to local hotel and follow-up with [facility]  Additional comments: n/a   Reason for Continuation of Hospitalization:  Anxiety Depression Medication stabilization Suicidal ideation  Estimated length of stay: 0 days; stable for DC  Review of initial/current patient goals per problem list:   1.  Goal(s): Patient will participate in aftercare plan  Met:  Yes  Target date: 3-5 days from date of admission   As evidenced by: Patient will participate within aftercare plan AEB aftercare provider and housing plan at discharge being identified.  02/02/15: CSW to work with Pt to assess for appropriate discharge plan and faciliate appointments and referrals as needed prior to d/c. 02/04/15: CSW continues to assess for appropriate referrals. 02/05/15: Pt will discharge to local hotel and follow-up with [facility].  2.  Goal (s): Patient will exhibit decreased depressive symptoms and suicidal ideations.  Met:  Adequate for DC  Target date:  3-5 days from date of admission   As evidenced by: Patient will utilize self rating of depression at 3 or below and demonstrate decreased signs of depression or be deemed stable for discharge by MD.  02/02/15: Pt rates depression at 10/10, expresses feeling hopeless.  02/04/15: Pt continues to rate depression highly; endorses passive SI  02/05/15: MD feels that Pt's symptoms have decreased to the point that they can be managed in an outpatient setting.  3.  Goal(s): Patient will demonstrate decreased signs and symptoms of anxiety.  Met:  Adequate for DC  Target date: 3-5 days from date of admission   As evidenced by: Patient will utilize self rating of anxiety at 3 or below and demonstrated decreased signs of anxiety, or be deemed stable for discharge by MD  02/02/15: Pt rates anxiety at 10/10; remains withdrawn in the milieu  02/04/15: Pt rates anxiety highly; observed to be withdrawn on the unit.  02/05/15: MD feels that Pt's symptoms have decreased to the point that they can be managed in an outpatient setting.  Attendees:  Patient:    Family:    Physician: Dr. Parke Poisson, MD  02/05/2015 2:50 PM  Nursing: Lars Pinks, RN Case manager  02/05/2015 2:50 PM  Clinical Social Worker [name], Latanya Presser, MSW 02/05/2015 2:50 PM  Other: Lucinda Dell, [name] Sessions Liasion 02/05/2015 2:50 PM  Clinical: Keane Police, RN 02/05/2015 2:50 PM  Other: , RN Charge Nurse 02/05/2015 2:50 PM  Other:     [name], [place] MSW

## 2015-02-05 NOTE — ED Provider Notes (Signed)
Comment planes of  chest pain typical of angina onset this afternoon. He's been getting similar pain approximately once or twice per week when he gets emotionally upset. Pain is typical of angina is having the past pain is increased in frequency over the past 2 months. He saw a cardiologist in [facility] August 2016 had stress test but did not follow-up and get result. He treated himself with nitroglycerin today with relief. He uses nitroglycerin approximately twice per week.  Orlie Dakin, MD 02/05/15 2121

## 2015-02-05 NOTE — ED Notes (Signed)
Pt given two sandwiches and a coke this was ok by [employer] PA

## 2015-02-05 NOTE — ED Notes (Signed)
Pt c/o chest pain and anxiety after being released from [facility] facility yesterday. Pt. Is looking to go back into treatment.

## 2015-02-05 NOTE — BH Assessment (Signed)
Patient was transferred to this line stating that he was in the Extended Stay and he didn't "feel sage" but would not go into detail. Patient was told to contact 911 if he felt like he was in danger and he states that he does not feel like he is in danger but doesn't "feel safe." When asked how he states "I just don't." Patient was encouraged to contact Mobile Crisis to speak with a counselor that could come out to speak with him face-to-face. Patient declined that information and states "I need to go somewhere." Patient was encouraged to go to the nearest emergency department and he declined and states "can't I just go back to behavioral health?" I told him that he is able to go into behavioral health but if he does not feel safe he should contact 911. Patient states that he feels safe enough to go to [facility]. Patient states "what will they do when I get there?" Patient was informed that he will be assessed and the final recommendation will be made upon assessment. Patient asked if he would "be able to come back in?" and was informed that if he does not feel safe to be alone he can contact 911. He states that he just wants to know if he can be readmitted to [facility]. He was informed that cannot be determined over the phone. Patient [facility].   Informed Letitia Libra, RN, [facility] that patient called and was provided options and declined and states that he feels safe enough to come to [facility] for an evaluation. AC states that she is aware.  Rosalin Hawking, LCSW Therapeutic Triage Specialist [place] 02/05/2015 6:00 PM'

## 2015-02-05 NOTE — Plan of Care (Signed)
Problem: Alteration in mood & ability to function due to Goal: STG-Patient will attend groups Outcome: Progressing Pt attended evening wrap up group     
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
## 2015-02-05 NOTE — Progress Notes (Signed)
  [facility] Adult Case Management Discharge Plan :  Will you be returning to the same living situation after discharge:  No, Pt discharging to local hotel At discharge, do you have transportation home?: Yes,  Pt car is at the hospital Do you have the ability to pay for your medications: Yes,  Pt provided with prescriptions  Release of information consent forms completed and in the chart;  Patient's signature needed at discharge.  Patient to Follow up at: Follow-up Information    Follow up with [facility] 03/01/[facility].   Specialty:  Internal Medicine   Why:  at 2:15pm with [place] Holl[place]. This will be your primary care provider.   Contact information:   [place] [phone]      Follow up with [facility].   Specialty:  Behavioral Health   Why:  You may walk-in Monday-Friday between 8am-3pm within 7 days of your discharge to be seen by a doctor and therapist. Please arrive early to avoid long wait times. Inform staff that you need a hospital discharge. A staff member from [facility] will contact yo.   Contact information:   [address] [phone]       Next level of care provider has access to [place]  Patient denies SI/HI: Yes,  Pt denies    Safety Planning and Suicide Prevention discussed: Yes,  with Pt; declines family contact  Have you used any form of tobacco in the last 30 days? (Cigarettes, Smokeless Tobacco, Cigars, and/or Pipes): Yes  Has patient been referred to the Quitline?: Patient refused referral  Bo Mcclintock 02/05/2015, 2:55 PM

## 2015-02-05 NOTE — H&P (Signed)
PCP:  PROVIDER NOT IN SYSTEM    Referring provider Hannah   Chief Complaint:  Chest pain  HPI: Nathan Santana is a 54 y.o. male   has a past medical history of HIV infection ([place]); Cancer ([place]); Hypertension; Angina pectoris ([place]); Scrotal cyst; Anxiety; and Depression.   Patient was just recently evaluated in emergency department with symptoms of depression exacerbated by recent breakup with his girlfriend. Patient is currently homeless. Although patient has been sober in the past he has recently restarted use of alcohol. The plan was patient to be discharged to [facility].  Apparently when patient went back to the place  he was staying he noticed the smell of weed which made him very anxious he developed chest pain radiating to left chest and arm and presented back to emergency department. While in route the patient stated again that he wishes he was not alive. HE did not have any particular plan. Patient stated that he had in the past history of coronary artery disease says has been followed by [facility] cardiologist in [place] he is unsure if he ever had a cardiac catheterization.  Patient currently denies any active suicidal plans. Patient had recent admission for chest pain evaluation recently at [facility].  Chest pain symptoms are better with nitroglycerin and aspirin. Patient states that he often develops chest pain especially with anxiety. Troponin and EKG was within normal limits  Hospitalist was called for admission for chest pain  Review of Systems:    Pertinent positives include: chest pain, depression or anxiety  Constitutional:  No weight loss, night sweats, Fevers, chills, fatigue, weight loss  HEENT:  No headaches, Difficulty swallowing,Tooth/dental problems,Sore throat,  No sneezing, itching, ear ache, nasal congestion, post nasal drip,  Cardio-vascular:  No  Orthopnea, PND, anasarca, dizziness, palpitations.no Bilateral lower extremity swelling  GI:  No  heartburn, indigestion, abdominal pain, nausea, vomiting, diarrhea, change in bowel habits, loss of appetite, melena, blood in stool, hematemesis Resp:  no shortness of breath at rest. No dyspnea on exertion, No excess mucus, no productive cough, No non-productive cough, No coughing up of blood.No change in color of mucus.No wheezing. Skin:  no rash or lesions. No jaundice GU:  no dysuria, change in color of urine, no urgency or frequency. No straining to urinate.  No flank pain.  Musculoskeletal:  No joint pain or no joint swelling. No decreased range of motion. No back pain.  Psych:  No change in mood or affect. No. No memory loss.  Neuro: no localizing neurological complaints, no tingling, no weakness, no double vision, no gait abnormality, no slurred speech, no confusion  Otherwise ROS are negative except for above, 10 systems were reviewed  Past Medical History: Past Medical History  Diagnosis Date  . HIV infection ([place])   . Cancer ([place])   . Hypertension   . Angina pectoris ([place])   . Scrotal cyst   . Anxiety   . Depression    Past Surgical History  Procedure Laterality Date  . Scrotal turmor removed       Medications: Prior to Admission medications   Medication Sig Start Date End Date Taking? Authorizing Provider  amLODipine (NORVASC) 10 MG tablet Take 1 tablet (10 mg total) by mouth daily. 02/05/15  Yes Benjamine Mola, FNP  aspirin EC 162 MG EC tablet Take 1 tablet (162 mg total) by mouth daily. 02/05/15  Yes Benjamine Mola, FNP  ATRIPLA 600-200-300 MG tablet Take 1 tab daily at bedtime 02/05/15  Yes  Benjamine Mola, FNP  carvedilol (COREG) 25 MG tablet Take 1 tablet (25 mg total) by mouth 2 (two) times daily with a meal. 02/05/15  Yes Benjamine Mola, FNP  fluticasone (FLONASE) 50 MCG/ACT nasal spray Place 1 spray into both nostrils 2 (two) times daily. 02/05/15  Yes Benjamine Mola, FNP  lisinopril (PRINIVIL,ZESTRIL) 10 MG tablet Take 1 tablet (10 mg total) by mouth  daily. 02/05/15  Yes Benjamine Mola, FNP  nicotine (NICODERM CQ - DOSED IN MG/24 HOURS) 21 mg/24hr patch Place 1 patch (21 mg total) onto the skin daily. 02/05/15  Yes John Johnn Hai, FNP  NITROSTAT 0.4 MG SL tablet Place 1 tablet (0.4 mg total) under the tongue every 5 (five) minutes as needed for chest pain. 02/05/15  Yes Benjamine Mola, FNP  pantoprazole (PROTONIX) 40 MG tablet Take 1 tablet (40 mg total) by mouth daily. 02/05/15  Yes Benjamine Mola, FNP  PARoxetine (PAXIL) 20 MG tablet Take 1 tablet (20 mg total) by mouth daily. 02/05/15  Yes Benjamine Mola, FNP  rosuvastatin (CRESTOR) 20 MG tablet Take 1 tablet (20 mg total) by mouth daily. 02/05/15  Yes Benjamine Mola, FNP  zolpidem (AMBIEN) 5 MG tablet Take 1 tablet (5 mg total) by mouth at bedtime as needed for sleep. 02/05/15  Yes Benjamine Mola, FNP    Allergies:  No Known Allergies  Social History:  Ambulatory   Independent Homeless stays with family members or friends      reports that he has been smoking Cigars.  He does not have any smokeless tobacco history on file. He reports that he drinks about 0.6 oz of alcohol per week. He reports that he does not use illicit drugs.    Family History: family history includes Hypertension in his other.    Physical Exam: Patient Vitals for the past 24 hrs:  BP Temp Temp src Pulse Resp SpO2  02/05/15 1846 172/90 mmHg 98.7 F (37.1 C) Oral 77 20 100 %    1. General:  in No Acute distress 2. Psychological: Alert and   Oriented 3. Head/ENT:   Moist   Mucous Membranes                          Head Non traumatic, neck supple                          Normal   Dentition 4. SKIN:  decreased Skin turgor,  Skin clean Dry and intact no rash 5. Heart: Regular rate and rhythm no Murmur, Rub or gallop 6. Lungs: Clear to auscultation bilaterally, no wheezes or crackles   7. Abdomen: Soft, non-tender, Non distended 8. Lower extremities: no clubbing, cyanosis, or edema 9. Neurologically  Grossly intact, moving all 4 extremities equally 10. MSK: Normal range of motion  body mass index is unknown because there is no weight on file.   Labs on Admission:   Results for orders placed or performed during the hospital encounter of 02/05/15 (from the past 24 hour(s))  Basic metabolic panel     Status: Abnormal   Collection Time: 02/05/15  7:23 PM  Result Value Ref Range   Sodium 141 135 - 145 mmol/L   Potassium 4.0 3.5 - 5.1 mmol/L   Chloride 106 101 - 111 mmol/L   CO2 27 22 - 32 mmol/L   Glucose, Bld 108 (H) 65 - 99 mg/dL  BUN 24 (H) 6 - 20 mg/dL   Creatinine, Ser 0.96 0.61 - 1.24 mg/dL   Calcium 9.4 8.9 - 10.3 mg/dL   GFR calc non Af Amer >60 >60 mL/min   GFR calc Af Amer >60 >60 mL/min   Anion gap 8 5 - 15  CBC     Status: Abnormal   Collection Time: 02/05/15  7:23 PM  Result Value Ref Range   WBC 7.3 4.0 - 10.5 K/uL   RBC 3.98 (L) 4.22 - 5.81 MIL/uL   Hemoglobin 13.2 13.0 - 17.0 g/dL   HCT 40.1 39.0 - 52.0 %   MCV 100.8 (H) 78.0 - 100.0 fL   MCH 33.2 26.0 - 34.0 pg   MCHC 32.9 30.0 - 36.0 g/dL   RDW 12.0 11.5 - 15.5 %   Platelets 187 150 - 400 K/uL  Troponin I     Status: None   Collection Time: 02/05/15  7:23 PM  Result Value Ref Range   Troponin I <0.03 <0.031 ng/mL  Ethanol     Status: None   Collection Time: 02/05/15  7:47 PM  Result Value Ref Range   Alcohol, Ethyl (B) <5 <5 mg/dL  Urine rapid drug screen (hosp performed)     Status: None   Collection Time: 02/05/15  9:26 PM  Result Value Ref Range   Opiates NONE DETECTED NONE DETECTED   Cocaine NONE DETECTED NONE DETECTED   Benzodiazepines NONE DETECTED NONE DETECTED   Amphetamines NONE DETECTED NONE DETECTED   Tetrahydrocannabinol NONE DETECTED NONE DETECTED   Barbiturates NONE DETECTED NONE DETECTED    UA not obtained  Lab Results  Component Value Date   HGBA1C 5.3 01/30/2015    Estimated Creatinine Clearance: 96.6 mL/min (by C-G formula based on Cr of 0.96).  BNP (last 3 results) No  results for input(s): PROBNP in the last 8760 hours.  Other results:  I have pearsonaly reviewed this: ECG REPORT  Rate: 68 Rhythm sinus by bundle branch block  ST&T Change:  no significant ischemic changes compared to prior  normal QTC    There were no vitals filed for this visit.   Cultures: No results found for: [place], [place], [place], REPTSTATUS   Radiological Exams on Admission: Dg Chest 2 View  02/05/2015  CLINICAL DATA:  Patient with chest pain.  History of anxiety. EXAM: CHEST  2 VIEW COMPARISON:  Chest radiograph 02/02/2015. FINDINGS: Normal cardiac and mediastinal contours. No consolidative pulmonary opacities. No pleural effusion or pneumothorax. Regional skeleton is unremarkable. IMPRESSION: No active cardiopulmonary disease. Electronically Signed   By: Lovey Newcomer M.D.   On: 02/05/2015 19:50    Chart has been reviewed  Family not at  Bedside     Assessment/Plan  54 yo  M with possible hx of CAD here with chest pain and suicidal ideations Present on Admission:  . Chest pain unclear if patient has stroke coronary artery disease. But we'll admit cycle cardiac enzymes obtain lipid panel and echogram to evaluate for any wall motion abnormality she will benefit from stress testing given her repeated admissions for chest pain   . HIV (human immunodeficiency virus infection) ([place]) we'll continue home medications check CD4 count  . CAD (coronary artery disease) unsure if patient has diagnosed coronary artery disease. Will need further evaluation and old records obtained from cardiology office   suicidal ideations  - patient at this point not endorsing suicidal plan. He is just feeling very depressed and feels that life not Worth living. Will  consult behavioral health  . Hypertension -continue home medications   Prophylaxis:  Lovenox   CODE STATUS:  FULL CODE   Disposition:  To home once workup is complete and patient is stable  Other plan as per orders.  I have  spent a total of 55 min on this admission  Gennie Eisinger 02/05/2015, 10:26 PM  [facility]  Pager [phone]   after 2 AM please page floor coverage PA If 7AM-7PM, please contact the day team taking care of the patient  [email or web address]  Password [email or web address]
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
## 2015-02-06 ENCOUNTER — Observation Stay (HOSPITAL_COMMUNITY): Payer: Medicare Other

## 2015-02-06 ENCOUNTER — Other Ambulatory Visit: Payer: Self-pay

## 2015-02-06 ENCOUNTER — Encounter (HOSPITAL_COMMUNITY): Payer: Self-pay

## 2015-02-06 DIAGNOSIS — F329 Major depressive disorder, single episode, unspecified: Secondary | ICD-10-CM | POA: Diagnosis not present

## 2015-02-06 DIAGNOSIS — R079 Chest pain, unspecified: Secondary | ICD-10-CM | POA: Diagnosis not present

## 2015-02-06 DIAGNOSIS — Z21 Asymptomatic human immunodeficiency virus [HIV] infection status: Secondary | ICD-10-CM | POA: Diagnosis not present

## 2015-02-06 DIAGNOSIS — F332 Major depressive disorder, recurrent severe without psychotic features: Secondary | ICD-10-CM

## 2015-02-06 DIAGNOSIS — I25118 Atherosclerotic heart disease of native coronary artery with other forms of angina pectoris: Secondary | ICD-10-CM

## 2015-02-06 DIAGNOSIS — I2510 Atherosclerotic heart disease of native coronary artery without angina pectoris: Secondary | ICD-10-CM | POA: Diagnosis not present

## 2015-02-06 LAB — CBC
HCT: 35.6 % — ABNORMAL LOW (ref 39.0–52.0)
Hemoglobin: 11.8 g/dL — ABNORMAL LOW (ref 13.0–17.0)
MCH: 33.1 pg (ref 26.0–34.0)
MCHC: 33.1 g/dL (ref 30.0–36.0)
MCV: 99.7 fL (ref 78.0–100.0)
PLATELETS: 157 10*3/uL (ref 150–400)
RBC: 3.57 MIL/uL — ABNORMAL LOW (ref 4.22–5.81)
RDW: 12 % (ref 11.5–15.5)
WBC: 6.3 10*3/uL (ref 4.0–10.5)

## 2015-02-06 LAB — PHOSPHORUS: Phosphorus: 3.6 mg/dL (ref 2.5–4.6)

## 2015-02-06 LAB — COMPREHENSIVE METABOLIC PANEL
ALBUMIN: 3.4 g/dL — AB (ref 3.5–5.0)
ALK PHOS: 109 U/L (ref 38–126)
ALT: 63 U/L (ref 17–63)
AST: 35 U/L (ref 15–41)
Anion gap: 6 (ref 5–15)
BILIRUBIN TOTAL: 0.5 mg/dL (ref 0.3–1.2)
BUN: 21 mg/dL — AB (ref 6–20)
CALCIUM: 9.2 mg/dL (ref 8.9–10.3)
CO2: 28 mmol/L (ref 22–32)
CREATININE: 0.86 mg/dL (ref 0.61–1.24)
Chloride: 107 mmol/L (ref 101–111)
GFR calc Af Amer: >60 mL/min (ref >60)
GLUCOSE: 96 mg/dL (ref 65–99)
Potassium: 4 mmol/L (ref 3.5–5.1)
Sodium: 141 mmol/L (ref 135–145)
TOTAL PROTEIN: 6.4 g/dL — AB (ref 6.5–8.1)

## 2015-02-06 LAB — TROPONIN I: Troponin I: <0.03 ng/mL (ref <0.031)

## 2015-02-06 LAB — LIPID PANEL
Cholesterol: 160 mg/dL (ref 0–200)
HDL: 37 mg/dL — AB (ref >40)
LDL CALC: 95 mg/dL (ref 0–99)
Total CHOL/HDL Ratio: 4.3 RATIO
Triglycerides: 138 mg/dL (ref <150)
VLDL: 28 mg/dL (ref 0–40)

## 2015-02-06 LAB — MAGNESIUM: Magnesium: 2.2 mg/dL (ref 1.7–2.4)

## 2015-02-06 LAB — TSH: TSH: 1.967 u[IU]/mL (ref 0.350–4.500)

## 2015-02-06 MED ORDER — ENOXAPARIN SODIUM 40 MG/0.4ML ~~LOC~~ SOLN
40.0000 mg | SUBCUTANEOUS | Status: DC
  Administered 2015-02-06 – 2015-02-10 (×5): 40 mg via SUBCUTANEOUS
  Filled 2015-02-06 (×5): qty 0.4

## 2015-02-06 MED ORDER — PANTOPRAZOLE SODIUM 40 MG PO TBEC
40.0000 mg | DELAYED_RELEASE_TABLET | Freq: Every day | ORAL | Status: DC
  Administered 2015-02-06: 40 mg via ORAL
  Filled 2015-02-06: qty 1

## 2015-02-06 MED ORDER — AMLODIPINE BESYLATE 10 MG PO TABS
10.0000 mg | ORAL_TABLET | Freq: Every day | ORAL | Status: DC
  Administered 2015-02-06 – 2015-02-10 (×5): 10 mg via ORAL
  Filled 2015-02-06 (×5): qty 1

## 2015-02-06 MED ORDER — ASPIRIN EC 81 MG PO TBEC
162.0000 mg | DELAYED_RELEASE_TABLET | Freq: Every day | ORAL | Status: DC
  Administered 2015-02-06 – 2015-02-10 (×5): 162 mg via ORAL
  Filled 2015-02-06 (×5): qty 2

## 2015-02-06 MED ORDER — ZOLPIDEM TARTRATE 5 MG PO TABS
5.0000 mg | ORAL_TABLET | Freq: Every evening | ORAL | Status: DC | PRN
  Administered 2015-02-06: 5 mg via ORAL
  Filled 2015-02-06: qty 1

## 2015-02-06 MED ORDER — ACETAMINOPHEN 325 MG PO TABS
650.0000 mg | ORAL_TABLET | Freq: Four times a day (QID) | ORAL | Status: DC | PRN
  Administered 2015-02-06: 650 mg via ORAL
  Filled 2015-02-06: qty 2

## 2015-02-06 MED ORDER — SODIUM CHLORIDE 0.9 % IJ SOLN
3.0000 mL | Freq: Two times a day (BID) | INTRAMUSCULAR | Status: DC
  Administered 2015-02-06 – 2015-02-10 (×10): 3 mL via INTRAVENOUS

## 2015-02-06 MED ORDER — TRAZODONE HCL 50 MG PO TABS
50.0000 mg | ORAL_TABLET | Freq: Every day | ORAL | Status: DC
  Administered 2015-02-06 – 2015-02-09 (×4): 50 mg via ORAL
  Filled 2015-02-06 (×4): qty 1

## 2015-02-06 MED ORDER — EFAVIRENZ-EMTRICITAB-TENOFOVIR 600-200-300 MG PO TABS
1.0000 | ORAL_TABLET | Freq: Every day | ORAL | Status: DC
  Administered 2015-02-06 – 2015-02-09 (×5): 1 via ORAL
  Filled 2015-02-06 (×6): qty 1

## 2015-02-06 MED ORDER — LISINOPRIL 10 MG PO TABS
10.0000 mg | ORAL_TABLET | Freq: Every day | ORAL | Status: DC
  Administered 2015-02-06 – 2015-02-07 (×2): 10 mg via ORAL
  Filled 2015-02-06 (×2): qty 1

## 2015-02-06 MED ORDER — ONDANSETRON HCL 4 MG/2ML IJ SOLN: 4.0000 mg | Freq: Four times a day (QID) | INTRAMUSCULAR | Status: DC | PRN

## 2015-02-06 MED ORDER — ACETAMINOPHEN 650 MG RE SUPP: 650.0000 mg | Freq: Four times a day (QID) | RECTAL | Status: DC | PRN

## 2015-02-06 MED ORDER — CARVEDILOL 25 MG PO TABS
25.0000 mg | ORAL_TABLET | Freq: Two times a day (BID) | ORAL | Status: DC
  Administered 2015-02-06 – 2015-02-10 (×8): 25 mg via ORAL
  Filled 2015-02-06 (×9): qty 1

## 2015-02-06 MED ORDER — ONDANSETRON HCL 4 MG PO TABS: 4.0000 mg | ORAL_TABLET | Freq: Four times a day (QID) | ORAL | Status: DC | PRN

## 2015-02-06 MED ORDER — PAROXETINE HCL 20 MG PO TABS
40.0000 mg | ORAL_TABLET | Freq: Every day | ORAL | Status: DC
  Administered 2015-02-07 – 2015-02-10 (×4): 40 mg via ORAL
  Filled 2015-02-06 (×4): qty 2

## 2015-02-06 MED ORDER — HYDROCODONE-ACETAMINOPHEN 5-325 MG PO TABS
1.0000 | ORAL_TABLET | ORAL | Status: DC | PRN
  Administered 2015-02-06: 1 via ORAL
  Administered 2015-02-07 – 2015-02-10 (×6): 2 via ORAL
  Filled 2015-02-06 (×4): qty 2
  Filled 2015-02-06: qty 1
  Filled 2015-02-06 (×2): qty 2

## 2015-02-06 MED ORDER — FAMOTIDINE 20 MG PO TABS
40.0000 mg | ORAL_TABLET | Freq: Every day | ORAL | Status: DC
  Administered 2015-02-06 – 2015-02-10 (×5): 40 mg via ORAL
  Filled 2015-02-06 (×5): qty 2

## 2015-02-06 MED ORDER — ROSUVASTATIN CALCIUM 20 MG PO TABS
20.0000 mg | ORAL_TABLET | Freq: Every day | ORAL | Status: DC
  Administered 2015-02-06 – 2015-02-10 (×5): 20 mg via ORAL
  Filled 2015-02-06 (×5): qty 1

## 2015-02-06 MED ORDER — PAROXETINE HCL 20 MG PO TABS
20.0000 mg | ORAL_TABLET | Freq: Every day | ORAL | Status: DC
  Administered 2015-02-06: 20 mg via ORAL
  Filled 2015-02-06: qty 1

## 2015-02-06 NOTE — Care Management Note (Signed)
Case Management Note  Patient Details  Name: Nathan Santana MRN: [id number] Date of Birth: 1960-10-07  Subjective/Objective:                  depression  Action/Plan: CM spoke with patient at the bedside. Information sheet for [facility] and [facility] provided.   Expected Discharge Date:   unknown              Expected Discharge Plan:  Home/Self Care  In-House Referral:     Discharge planning Services  CM Consult, [facility]  Post Acute Care Choice:    Choice offered to:     DME Arranged:    DME Agency:     HH Arranged:    HH Agency:     Status of Service:  In process, will continue to follow  Medicare Important Message Given:    Date Medicare IM Given:    Medicare IM give by:    Date Additional Medicare IM Given:    Additional Medicare Important Message give by:     If discussed at [place] of Stay Meetings, dates discussed:    Additional Comments:  Apolonio Schneiders, RN 02/06/2015, 1:01 PM

## 2015-02-06 NOTE — Care Management Obs Status (Signed)
[facility] NOTIFICATION   Patient Details  Name: Nathan Santana MRN: [id number] Date of Birth: Jan 21, 1961   Medicare Observation Status Notification Given:  Yes    Apolonio Schneiders, RN 02/06/2015, 1:00 PM

## 2015-02-06 NOTE — Progress Notes (Signed)
[facility] PROGRESS NOTE  Nathan Santana [id number] DOB: 04/06/1960 DOA: 02/05/2015 PCP: PROVIDER NOT IN SYSTEM  Assessment/Plan: #1 chest pain Patient had presented with chest pain. Patient with prior history of coronary artery disease that was followed in the past at [facility] however patient stated he moved to [place]. Patient with history of hypertension, angina, anxiety and depression. Fasting lipid panel pending. Patient currently chest pain-free. Cardiac enzymes negative 3. Chest x-ray negative. EKG with right bundle branch block. 2-D echo with EF of 50-55% with no wall motion abnormalities. Continue aspirin, Coreg, lisinopril, Crestor. Due to patient's prior history of coronary artery disease per patient with history of hypertension and probable hyperlipidemia as patient is on Crestor will consult with cardiology for further evaluation and management and possibility of inpatient stress test. Follow.  #2 probable hyperlipidemia Check a fasting lipid panel. Continue home dose Crestor.  #3 history of HIV CD4 count pending. Continue atripla.  #4 history of coronary artery disease See problem #1.  #5 major depressive disorder/suicidal ideation Patient with complaints of depression and feels life is not worth living. Patient was just discharged from [facility]. Continue current regimen of Paxil. Psychiatry has been consulted.   #6 hypertension Stable. Continue lisinopril, Coreg.  #7 prophylaxis Discontinue Protonix. Pepcid for GI prophylaxis. Lovenox for DVT prophylaxis.   Code Status: Full Family Communication: Updated patient. No family present. Disposition Plan: Pending cardiac evaluation and psychiatric evaluation. Patient also homeless social work consultation pending.   Consultants:  Psychiatry: Dr.Akintayo 02/06/2015  Procedures:  2-D echo 02/06/2015  Chest x-ray 02/05/2015  Antibiotics:  None  HPI/Subjective: Patient depressed. Denies any current  chest pain. No shortness of breath.  Objective: Filed Vitals:   02/06/15 0634  BP: 123/75  Pulse: 58  Temp: 97.9 F (36.6 C)  Resp: 60    Intake/Output Summary (Last 24 hours) at 02/06/15 1052 Last data filed at 02/06/15 [id number]  Gross per 24 hour  Intake    360 ml  Output      0 ml  Net    360 ml   Filed Weights   02/06/15 0003  Weight: 89 kg (196 lb 3.4 oz)    Exam:   General:  NAD. Patient depressed  Cardiovascular: RRR  Respiratory: CTAB  Abdomen: Soft, nontender, nondistended, positive bowel sounds  Musculoskeletal: No clubbing cyanosis or edema.  Data Reviewed: Basic Metabolic Panel:  Recent Labs Lab 02/02/15 1405 02/05/15 1923 02/06/15 0512  NA 137 141 141  K 4.4 4.0 4.0  CL 106 106 107  CO2 26 27 28   GLUCOSE 114* 108* 96  BUN 16 24* 21*  CREATININE 0.98 0.96 0.86  CALCIUM 9.1 9.4 9.2  MG  --   --  2.2  PHOS  --   --  3.6   Liver Function Tests:  Recent Labs Lab 02/06/15 0512  AST 35  ALT 63  ALKPHOS 109  BILITOT 0.5  PROT 6.4*  ALBUMIN 3.4*   No results for input(s): LIPASE, AMYLASE in the last 168 hours. No results for input(s): AMMONIA in the last 168 hours. CBC:  Recent Labs Lab 02/02/15 1405 02/05/15 1923 02/06/15 0512  WBC 7.7 7.3 6.3  NEUTROABS 5.1  --   --   HGB 12.5* 13.2 11.8*  HCT 36.7* 40.1 35.6*  MCV 98.1 100.8* 99.7  PLT 137* 187 157   Cardiac Enzymes:  Recent Labs Lab 02/05/15 1923 02/05/15 2238 02/06/15 0512  TROPONINI <0.03 <0.03 <0.03   BNP (last 3 results) No results for  input(s): BNP in the last 8760 hours.  ProBNP (last 3 results) No results for input(s): PROBNP in the last 8760 hours.  CBG: No results for input(s): GLUCAP in the last 168 hours.  No results found for this or any previous visit (from the past 240 hour(s)).   Studies: Dg Chest 2 View  02/05/2015  CLINICAL DATA:  Patient with chest pain.  History of anxiety. EXAM: CHEST  2 VIEW COMPARISON:  Chest radiograph 02/02/2015.  FINDINGS: Normal cardiac and mediastinal contours. No consolidative pulmonary opacities. No pleural effusion or pneumothorax. Regional skeleton is unremarkable. IMPRESSION: No active cardiopulmonary disease. Electronically Signed   By: Lovey Newcomer M.D.   On: 02/05/2015 19:50    Scheduled Meds: . amLODipine  10 mg Oral Daily  . aspirin EC  162 mg Oral Daily  . carvedilol  25 mg Oral BID WC  . efavirenz-emtricitabine-tenofovir  1 tablet Oral QHS  . enoxaparin (LOVENOX) injection  40 mg Subcutaneous Q24H  . lisinopril  10 mg Oral Daily  . pantoprazole  40 mg Oral Daily  . PARoxetine  20 mg Oral Daily  . rosuvastatin  20 mg Oral Daily  . sodium chloride  3 mL Intravenous Q12H   Continuous Infusions:   Principal Problem:   Chest pain Active Problems:   Major depressive disorder, recurrent, severe without psychotic features ([place])   HIV (human immunodeficiency virus infection) ([place])   CAD (coronary artery disease)   Suicidal ideations   Hypertension   Anxiety    Time spent: 2 minutes    [name],[name] [facility] Pager [phone]. If 7PM-7AM, please contact night-coverage at [email or web address], password [facility] 02/06/2015, 10:52 AM

## 2015-02-06 NOTE — Clinical Social Work Note (Signed)
Clinical Social Work Assessment  Patient Details  Name: Nathan Santana MRM: [id number] Date of Birth: 28-Jul-1960  Date of referral:  02/06/15               Reason for consult:  Discharge Planning, Housing Concerns/Homelessness                Permission sought to share information with:    Permission granted to share information::     Name::        Agency::     Relationship::     Contact Information:     Housing/Transportation Living arrangements for the past 2 months:  Homeless Source of Information:  Patient Patient Interpreter Needed:  None Criminal Activity/Legal Involvement Pertinent to Current Situation/Hospitalization:  No - Comment as needed Significant Relationships:  None Lives with:  Other (Comment) (homeless) Do you feel safe going back to the place where you live?  No Need for family participation in patient care:  No (Coment)  Care giving concerns:  SA , Homelessness   Social Worker assessment / plan:  Pt hospitalized under observation on 02/05/15. CW met with pt to assist with d/C planning. Pt reports he was recently d/C from [facility]. Pt reports he has a hx of depression and SA . Pt was sent to [facility] for shelter but left due to feeling " unsafe". Pt reports drug use at [employer]. Pt reports he has no family / friends that he can stay with at d/C. Day Center / Shelter list provided. Pt presently has a Actuary. CW will continue to follow to assist with d/C planning.  Employment status:  Disabled (Comment on whether or not currently receiving Disability) Insurance information:  Medicare PT Recommendations:  Not assessed at this time Information / Referral to community resources:  Shelter  Patient/Family's Response to care:  Disposition has not been determined.  Patient/Family's Understanding of and Emotional Response to Diagnosis, Current Treatment, and Prognosis:  Pt is struggling with depression / SA. Pt reports he is having trouble coping. " I don't want to stay at a  shelter. I don't know what to do. I won't go back to [employer]. "   Emotional Assessment Appearance:  Appears stated age Attitude/Demeanor/Rapport:  Other (cooperative) Affect (typically observed):  Anxious Orientation:   (hx of SA) Alcohol / Substance use:  Other, Alcohol Use Psych involvement (Current and /or in the community):  Yes (Comment)  Discharge Needs  Concerns to be addressed:  Homelessness Readmission within the last 30 days:  Yes ([facility] Current discharge risk:  Homeless, Psychiatric Illness, Substance Abuse Barriers to Discharge:  Active Substance Use, Homeless with medical needs   Loraine Maple  [phone] 02/06/2015, 9:43 AM

## 2015-02-06 NOTE — Progress Notes (Signed)
  Echocardiogram 2D Echocardiogram has been performed.  Lysle Rubens 02/06/2015, 11:27 AM

## 2015-02-06 NOTE — Consult Note (Signed)
[place] [facility] Consult   Reason for Consult:  Depression, suicidal thoughts, Drug abuse Referring Physician:  Dr. Grandville Silos Patient Identification: Nathan Santana MRN:  [id number] Principal Diagnosis: Chest pain Diagnosis:   Patient Active Problem List   Diagnosis Date Noted  . Severe recurrent major depression without psychotic features ([place]) [F33.2] 01/28/2015    Priority: High  . HIV (human immunodeficiency virus infection) ([place]) [Z21] 02/05/2015  . CAD (coronary artery disease) [I25.10] 02/05/2015  . Chest pain [R07.9] 02/05/2015  . Suicidal ideations [R45.851] 02/05/2015  . Hypertension [I10] 02/05/2015  . Anxiety [F41.9]   . Pain in the chest [R07.9]   . Depression [F32.9]   . Suicidal ideation [R45.851]   . Major depressive disorder, recurrent, severe without psychotic features ([place]) [F33.2] 01/28/2015    Total Time spent with patient: 1 hour  Subjective:   Nathan Santana is a 54 y.o. male patient admitted with worsening depression.  HPI:  Thanks for asking me to do a psychiatric consent on Nathan Santana, a  54 y.o. male with a history of coronary artery disease, angina, HIV, major depressive disorder, THC and Cocaine dependence. Patient was discharged from [facility] on 02/05/15 where he was treated for depression and drug use disorder, states that he relapsed on drugs after being clean for 8 years. He was referred to [facility] in [place] for drug rehabilitation. However, he states that when he got to [employer] yesterday he felt ''unsafe'' and suddenly developed chest pain, depressed and was having suicidal ideation, as a result he returned to [facility] ED for evaluation. Patient states that he is currently homeless, he moved to the [place] area from [place] from weeks ago due to being stressed out by his family. Today, he reports being depressed, homeless, hopeless, anxious and worrying about being homeless but denies psychosis, delusional thinking or  suicidal/homicidal thoughts. Also, he is asking to be referred to another drug rehabilitation facility. He is also asking for housing as he does not intend to return to his home town due to his inability to get along with his family.  Past Psychiatric History: Major depression, Cocaine use disorder-severe, Alcohol use disorder  Risk to Self: Is patient at risk for suicide?: No Risk to Others:   Prior Inpatient Therapy:   Prior Outpatient Therapy:    Past Medical History:  Past Medical History  Diagnosis Date  . HIV infection ([place])   . Cancer ([place])   . Hypertension   . Angina pectoris ([place])   . Scrotal cyst   . Anxiety   . Depression     Past Surgical History  Procedure Laterality Date  . Scrotal turmor removed     Family History:  Family History  Problem Relation Age of Onset  . Hypertension Other    Family Psychiatric  History: Social History:  History  Alcohol Use  . 0.6 oz/week  . 1 Cans of beer per week    Comment: one beer weekly     History  Drug Use No    Comment: no drugs since 2008    Social History   Social History  . Marital Status: Single    Spouse Name: N/A  . Number of Children: N/A  . Years of Education: N/A   Social History Main Topics  . Smoking status: Current Some Day Smoker -- 0.50 packs/day    Types: Cigars  . Smokeless tobacco: None  . Alcohol Use: 0.6 oz/week    1 Cans of beer per week  Comment: one beer weekly  . Drug Use: No     Comment: no drugs since 2008  . Sexual Activity: No   Other Topics Concern  . None   Social History Narrative   Additional Social History:                          Allergies:  No Known Allergies  Labs:  Results for orders placed or performed during the hospital encounter of 02/05/15 (from the past 48 hour(s))  Basic metabolic panel     Status: Abnormal   Collection Time: 02/05/15  7:23 PM  Result Value Ref Range   Sodium 141 135 - 145 mmol/L   Potassium 4.0 3.5 - 5.1 mmol/L    Chloride 106 101 - 111 mmol/L   CO2 27 22 - 32 mmol/L   Glucose, Bld 108 (H) 65 - 99 mg/dL   BUN 24 (H) 6 - 20 mg/dL   Creatinine, Ser 0.96 0.61 - 1.24 mg/dL   Calcium 9.4 8.9 - 10.3 mg/dL   GFR calc non Af Amer >60 >60 mL/min   GFR calc Af Amer >60 >60 mL/min    Comment: (NOTE) The eGFR has been calculated using the CKD EPI equation. This calculation has not been validated in all clinical situations. eGFR's persistently <60 mL/min signify possible Chronic Kidney Disease.    Anion gap 8 5 - 15  CBC     Status: Abnormal   Collection Time: 02/05/15  7:23 PM  Result Value Ref Range   WBC 7.3 4.0 - 10.5 K/uL   RBC 3.98 (L) 4.22 - 5.81 MIL/uL   Hemoglobin 13.2 13.0 - 17.0 g/dL   HCT 40.1 39.0 - 52.0 %   MCV 100.8 (H) 78.0 - 100.0 fL   MCH 33.2 26.0 - 34.0 pg   MCHC 32.9 30.0 - 36.0 g/dL   RDW 12.0 11.5 - 15.5 %   Platelets 187 150 - 400 K/uL  Troponin I     Status: None   Collection Time: 02/05/15  7:23 PM  Result Value Ref Range   Troponin I <0.03 <0.031 ng/mL    Comment:        NO INDICATION OF MYOCARDIAL INJURY.   Ethanol     Status: None   Collection Time: 02/05/15  7:47 PM  Result Value Ref Range   Alcohol, Ethyl (B) <5 <5 mg/dL    Comment:        LOWEST DETECTABLE LIMIT FOR SERUM ALCOHOL IS 5 mg/dL FOR MEDICAL PURPOSES ONLY   Urine rapid drug screen (hosp performed)     Status: None   Collection Time: 02/05/15  9:26 PM  Result Value Ref Range   Opiates NONE DETECTED NONE DETECTED   Cocaine NONE DETECTED NONE DETECTED   Benzodiazepines NONE DETECTED NONE DETECTED   Amphetamines NONE DETECTED NONE DETECTED   Tetrahydrocannabinol NONE DETECTED NONE DETECTED   Barbiturates NONE DETECTED NONE DETECTED    Comment:        DRUG SCREEN FOR MEDICAL PURPOSES ONLY.  IF CONFIRMATION IS NEEDED FOR ANY PURPOSE, NOTIFY LAB WITHIN 5 DAYS.        LOWEST DETECTABLE LIMITS FOR URINE DRUG SCREEN Drug Class       Cutoff (ng/mL) Amphetamine      1000 Barbiturate       200 Benzodiazepine   161 Tricyclics       096 Opiates          300  Cocaine          300 THC              50   Troponin I     Status: None   Collection Time: 02/05/15 10:38 PM  Result Value Ref Range   Troponin I <0.03 <0.031 ng/mL    Comment:        NO INDICATION OF MYOCARDIAL INJURY.   Magnesium     Status: None   Collection Time: 02/06/15  5:12 AM  Result Value Ref Range   Magnesium 2.2 1.7 - 2.4 mg/dL  Phosphorus     Status: None   Collection Time: 02/06/15  5:12 AM  Result Value Ref Range   Phosphorus 3.6 2.5 - 4.6 mg/dL  TSH     Status: None   Collection Time: 02/06/15  5:12 AM  Result Value Ref Range   TSH 1.967 0.350 - 4.500 uIU/mL  Comprehensive metabolic panel     Status: Abnormal   Collection Time: 02/06/15  5:12 AM  Result Value Ref Range   Sodium 141 135 - 145 mmol/L   Potassium 4.0 3.5 - 5.1 mmol/L   Chloride 107 101 - 111 mmol/L   CO2 28 22 - 32 mmol/L   Glucose, Bld 96 65 - 99 mg/dL   BUN 21 (H) 6 - 20 mg/dL   Creatinine, Ser 0.86 0.61 - 1.24 mg/dL   Calcium 9.2 8.9 - 10.3 mg/dL   Total Protein 6.4 (L) 6.5 - 8.1 g/dL   Albumin 3.4 (L) 3.5 - 5.0 g/dL   AST 35 15 - 41 U/L   ALT 63 17 - 63 U/L   Alkaline Phosphatase 109 38 - 126 U/L   Total Bilirubin 0.5 0.3 - 1.2 mg/dL   GFR calc non Af Amer >60 >60 mL/min   GFR calc Af Amer >60 >60 mL/min    Comment: (NOTE) The eGFR has been calculated using the CKD EPI equation. This calculation has not been validated in all clinical situations. eGFR's persistently <60 mL/min signify possible Chronic Kidney Disease.    Anion gap 6 5 - 15  CBC     Status: Abnormal   Collection Time: 02/06/15  5:12 AM  Result Value Ref Range   WBC 6.3 4.0 - 10.5 K/uL   RBC 3.57 (L) 4.22 - 5.81 MIL/uL   Hemoglobin 11.8 (L) 13.0 - 17.0 g/dL   HCT 35.6 (L) 39.0 - 52.0 %   MCV 99.7 78.0 - 100.0 fL   MCH 33.1 26.0 - 34.0 pg   MCHC 33.1 30.0 - 36.0 g/dL   RDW 12.0 11.5 - 15.5 %   Platelets 157 150 - 400 K/uL  Troponin I      Status: None   Collection Time: 02/06/15  5:12 AM  Result Value Ref Range   Troponin I <0.03 <0.031 ng/mL    Comment:        NO INDICATION OF MYOCARDIAL INJURY.   Lipid panel     Status: Abnormal   Collection Time: 02/06/15  5:12 AM  Result Value Ref Range   Cholesterol 160 0 - 200 mg/dL   Triglycerides 138 <150 mg/dL   HDL 37 (L) >40 mg/dL   Total CHOL/HDL Ratio 4.3 RATIO   VLDL 28 0 - 40 mg/dL   LDL Cholesterol 95 0 - 99 mg/dL    Comment:        Total Cholesterol/HDL:CHD Risk Coronary Heart Disease Risk Table  Men   Women  1/2 Average Risk   3.4   3.3  Average Risk       5.0   4.4  2 X Average Risk   9.6   7.1  3 X Average Risk  23.4   11.0        Use the calculated Patient Ratio above and the CHD Risk Table to determine the patient's CHD Risk.        ATP III CLASSIFICATION (LDL):  <100     mg/dL   Optimal  100-129  mg/dL   Near or Above                    Optimal  130-159  mg/dL   Borderline  160-189  mg/dL   High  >190     mg/dL   Very High Performed at [facility]     Current Facility-Administered Medications  Medication Dose Route Frequency Provider Last Rate Last Dose  . acetaminophen (TYLENOL) tablet 650 mg  650 mg Oral Q6H PRN Toy Baker, MD   650 mg at 02/06/15 0655   Or  . acetaminophen (TYLENOL) suppository 650 mg  650 mg Rectal Q6H PRN Toy Baker, MD      . amLODipine (NORVASC) tablet 10 mg  10 mg Oral Daily Toy Baker, MD   10 mg at 02/06/15 1007  . aspirin EC tablet 162 mg  162 mg Oral Daily Toy Baker, MD   162 mg at 02/06/15 1007  . carvedilol (COREG) tablet 25 mg  25 mg Oral BID WC Toy Baker, MD   25 mg at 02/06/15 0755  . efavirenz-emtricitabine-tenofovir (ATRIPLA) 600-200-300 MG per tablet 1 tablet  1 tablet Oral QHS Toy Baker, MD   1 tablet at 02/06/15 0038  . enoxaparin (LOVENOX) injection 40 mg  40 mg Subcutaneous Q24H Toy Baker, MD   40 mg at 02/06/15 1006   . HYDROcodone-acetaminophen (NORCO/VICODIN) 5-325 MG per tablet 1-2 tablet  1-2 tablet Oral Q4H PRN Toy Baker, MD      . lisinopril (PRINIVIL,ZESTRIL) tablet 10 mg  10 mg Oral Daily Toy Baker, MD   10 mg at 02/06/15 1007  . ondansetron (ZOFRAN) tablet 4 mg  4 mg Oral Q6H PRN Toy Baker, MD       Or  . ondansetron (ZOFRAN) injection 4 mg  4 mg Intravenous Q6H PRN Toy Baker, MD      . pantoprazole (PROTONIX) EC tablet 40 mg  40 mg Oral Daily Toy Baker, MD   40 mg at 02/06/15 1007  . [START ON 02/07/2015] PARoxetine (PAXIL) tablet 40 mg  40 mg Oral Daily Doron Shake      . rosuvastatin (CRESTOR) tablet 20 mg  20 mg Oral Daily Toy Baker, MD   20 mg at 02/06/15 1007  . sodium chloride 0.9 % injection 3 mL  3 mL Intravenous Q12H Toy Baker, MD   3 mL at 02/06/15 1000  . traZODone (DESYREL) tablet 50 mg  50 mg Oral QHS Keshayla Schrum        Musculoskeletal: Strength & Muscle Tone: within normal limits Gait & Station: normal Patient leans: N/A  Psychiatric Specialty Exam: ROS  Blood pressure 123/75, pulse 58, temperature 97.9 F (36.6 C), temperature source Oral, resp. rate 60, height 5' 11"  (1.803 m), weight 89 kg (196 lb 3.4 oz), SpO2 99 %.Body mass index is 27.38 kg/(m^2).  General Appearance: Casual  Eye Contact::  Good  Speech:  Clear and  Coherent  Volume:  Normal  Mood:  Dysphoric  Affect:  Appropriate  Thought Process:  Negative  Orientation:  Full (Time, Place, and Person)  Thought Content:  Negative  Suicidal Thoughts:  No  Homicidal Thoughts:  No  Memory:  Immediate;   Good Recent;   Good Remote;   Good  Judgement:  Fair  Insight:  Fair  Psychomotor Activity:  Normal  Concentration:  Good  Recall:  Good  Fund of Knowledge:Good  Language: Good  Akathisia:  No  Handed:  Right  AIMS (if indicated):     Assets:  Communication Skills Desire for Improvement  ADL's:  Intact  Cognition: WNL  Sleep:   fair    Treatment Plan Summary: Daily contact with patient to assess and evaluate symptoms and progress in treatment.   Medication management: -Increase Paxil to 13m daily for depression/anxiety. -Start Trazodone 566mqhs for insomnia -D/C Ambien  Disposition: - No evidence of imminent risk to self or others at present.   -Patient does not meet criteria for psychiatric inpatient admission. -Supportive therapy provided about ongoing stressors. -Social worker to refer patient to a drug rehab facility when he is medically cleared. -[facility]  AkCorena PilgrimMD 02/06/2015 12:01 PM

## 2015-02-07 ENCOUNTER — Encounter (HOSPITAL_COMMUNITY): Payer: Self-pay | Admitting: Cardiology

## 2015-02-07 ENCOUNTER — Observation Stay (HOSPITAL_COMMUNITY): Payer: Medicare Other

## 2015-02-07 DIAGNOSIS — N50811 Right testicular pain: Secondary | ICD-10-CM

## 2015-02-07 DIAGNOSIS — R072 Precordial pain: Secondary | ICD-10-CM

## 2015-02-07 DIAGNOSIS — R079 Chest pain, unspecified: Secondary | ICD-10-CM | POA: Diagnosis not present

## 2015-02-07 DIAGNOSIS — Z21 Asymptomatic human immunodeficiency virus [HIV] infection status: Secondary | ICD-10-CM | POA: Diagnosis not present

## 2015-02-07 DIAGNOSIS — I10 Essential (primary) hypertension: Secondary | ICD-10-CM | POA: Diagnosis not present

## 2015-02-07 DIAGNOSIS — F329 Major depressive disorder, single episode, unspecified: Secondary | ICD-10-CM | POA: Diagnosis not present

## 2015-02-07 LAB — BASIC METABOLIC PANEL
Anion gap: 6 (ref 5–15)
BUN: 14 mg/dL (ref 6–20)
CHLORIDE: 107 mmol/L (ref 101–111)
CO2: 28 mmol/L (ref 22–32)
CREATININE: 0.98 mg/dL (ref 0.61–1.24)
Calcium: 8.8 mg/dL — ABNORMAL LOW (ref 8.9–10.3)
GFR calc non Af Amer: >60 mL/min (ref >60)
Glucose, Bld: 95 mg/dL (ref 65–99)
POTASSIUM: 4.1 mmol/L (ref 3.5–5.1)
Sodium: 141 mmol/L (ref 135–145)

## 2015-02-07 LAB — CBC
HEMATOCRIT: 36.9 % — AB (ref 39.0–52.0)
HEMOGLOBIN: 12.1 g/dL — AB (ref 13.0–17.0)
MCH: 32.9 pg (ref 26.0–34.0)
MCHC: 32.8 g/dL (ref 30.0–36.0)
MCV: 100.3 fL — ABNORMAL HIGH (ref 78.0–100.0)
Platelets: 177 10*3/uL (ref 150–400)
RBC: 3.68 MIL/uL — AB (ref 4.22–5.81)
RDW: 11.9 % (ref 11.5–15.5)
WBC: 5.5 10*3/uL (ref 4.0–10.5)

## 2015-02-07 MED ORDER — IBUPROFEN 200 MG PO TABS
600.0000 mg | ORAL_TABLET | Freq: Three times a day (TID) | ORAL | Status: DC
  Administered 2015-02-07 – 2015-02-10 (×9): 600 mg via ORAL
  Filled 2015-02-07 (×9): qty 3

## 2015-02-07 MED ORDER — PANTOPRAZOLE SODIUM 40 MG PO TBEC
40.0000 mg | DELAYED_RELEASE_TABLET | Freq: Every day | ORAL | Status: DC
  Administered 2015-02-08 – 2015-02-10 (×3): 40 mg via ORAL
  Filled 2015-02-07 (×3): qty 1

## 2015-02-07 NOTE — Progress Notes (Signed)
[facility] PROGRESS NOTE  Nathan Santana [id number] DOB: [birth year] DOA: 02/05/2015 PCP: PROVIDER NOT IN SYSTEM  Assessment/Plan: #1 chest pain Patient had presented with chest pain. Patient with prior history of coronary artery disease that was followed in the past at [facility] however patient stated he moved to [place]. Patient with history of hypertension, angina, anxiety and depression. Fasting lipid panel pending. Patient currently chest pain-free. Cardiac enzymes negative 3. Chest x-ray negative. EKG with right bundle branch block. 2-D echo with EF of 50-55% with no wall motion abnormalities. Continue aspirin, Coreg, lisinopril, Crestor. Patient has been seen in consutlation by cardiology and recommending awaiting outside records. Cardiology ff.   #2 probable hyperlipidemia Check a fasting lipid panel. Continue home dose Crestor.  #3 history of HIV CD4 count pending. Continue atripla.  #4 history of coronary artery disease See problem #1.  #5 major depressive disorder/suicidal ideation Patient with complaints of depression and feels life is not worth living. Patient was just discharged from [facility]. Continue current regimen of Paxil. Psychiatry ff.  #6 hypertension Stable. Continue lisinopril, Coreg.  #7 Scrotal/Testicular pain Will check [place] of scrotum to rule out torsion and further evaluation.  #8 prophylaxis Pepcid for GI prophylaxis. Lovenox for DVT prophylaxis.   Code Status: Full Family Communication: Updated patient. No family present. Disposition Plan: Pending cardiac evaluation and psychiatric evaluation. Patient also homeless social work consultation pending.   Consultants:  Psychiatry: Dr.Akintayo 02/06/2015  Cardiology: Dr Stanford Breed 02/07/2015  Procedures:  2-D echo 02/06/2015  Chest x-ray 02/05/2015  Antibiotics:  None  HPI/Subjective: Patient c/o right scrotal pain. Intermittent CP. No SOB. No SI.  Objective: Filed Vitals:   02/07/15 0604 02/07/15 1147  BP: 157/91 145/81  Pulse: 64 60  Temp: 98.2 F (36.8 C) 97.8 F (36.6 C)  Resp: 18 20    Intake/Output Summary (Last 24 hours) at 02/07/15 1715 Last data filed at 02/07/15 1235  Gross per 24 hour  Intake   1680 ml  Output      0 ml  Net   1680 ml   Filed Weights   02/06/15 0003  Weight: 89 kg (196 lb 3.4 oz)    Exam:   General:  NAD. Patient depressed  Cardiovascular: RRR  Respiratory: CTAB  Abdomen: Soft, nontender, nondistended, positive bowel sounds  Musculoskeletal: No clubbing cyanosis or edema.  Data Reviewed: Basic Metabolic Panel:  Recent Labs Lab 02/02/15 1405 02/05/15 1923 02/06/15 0512 02/07/15 0618  NA 137 141 141 141  K 4.4 4.0 4.0 4.1  CL 106 106 107 107  CO2 26 27 28 28   GLUCOSE 114* 108* 96 95  BUN 16 24* 21* 14  CREATININE 0.98 0.96 0.86 0.98  CALCIUM 9.1 9.4 9.2 8.8*  MG  --   --  2.2  --   PHOS  --   --  3.6  --    Liver Function Tests:  Recent Labs Lab 02/06/15 0512  AST 35  ALT 63  ALKPHOS 109  BILITOT 0.5  PROT 6.4*  ALBUMIN 3.4*   No results for input(s): LIPASE, AMYLASE in the last 168 hours. No results for input(s): AMMONIA in the last 168 hours. CBC:  Recent Labs Lab 02/02/15 1405 02/05/15 1923 02/06/15 0512 02/07/15 0618  WBC 7.7 7.3 6.3 5.5  NEUTROABS 5.1  --   --   --   HGB 12.5* 13.2 11.8* 12.1*  HCT 36.7* 40.1 35.6* 36.9*  MCV 98.1 100.8* 99.7 100.3*  PLT 137* 187 157 177   Cardiac  Enzymes:  Recent Labs Lab 02/05/15 1923 02/05/15 2238 02/06/15 0512 02/06/15 1215 02/06/15 1815  TROPONINI <0.03 <0.03 <0.03 <0.03 <0.03   BNP (last 3 results) No results for input(s): BNP in the last 8760 hours.  ProBNP (last 3 results) No results for input(s): PROBNP in the last 8760 hours.  CBG: No results for input(s): GLUCAP in the last 168 hours.  No results found for this or any previous visit (from the past 240 hour(s)).   Studies: Dg Chest 2 View  02/05/2015   CLINICAL DATA:  Patient with chest pain.  History of anxiety. EXAM: CHEST  2 VIEW COMPARISON:  Chest radiograph 02/02/2015. FINDINGS: Normal cardiac and mediastinal contours. No consolidative pulmonary opacities. No pleural effusion or pneumothorax. Regional skeleton is unremarkable. IMPRESSION: No active cardiopulmonary disease. Electronically Signed   By: Lovey Newcomer M.D.   On: 02/05/2015 19:50    Scheduled Meds: . amLODipine  10 mg Oral Daily  . aspirin EC  162 mg Oral Daily  . carvedilol  25 mg Oral BID WC  . efavirenz-emtricitabine-tenofovir  1 tablet Oral QHS  . enoxaparin (LOVENOX) injection  40 mg Subcutaneous Q24H  . famotidine  40 mg Oral Daily  . ibuprofen  600 mg Oral TID  . lisinopril  10 mg Oral Daily  . [START ON 02/08/2015] pantoprazole  40 mg Oral Daily  . PARoxetine  40 mg Oral Daily  . rosuvastatin  20 mg Oral Daily  . sodium chloride  3 mL Intravenous Q12H  . traZODone  50 mg Oral QHS   Continuous Infusions:   Principal Problem:   Chest pain Active Problems:   Major depressive disorder, recurrent, severe without psychotic features (H[facility])   HIV (human immunodeficiency virus infection) ([place])   CAD (coronary artery disease)   Suicidal ideations   Hypertension   Anxiety    Time spent: 36 minutes    [name],[name] [facility] Pager [phone]. If 7PM-7AM, please contact night-coverage at [email or web address], password [facility] 02/07/2015, 5:15 PM

## 2015-02-07 NOTE — H&P (Signed)
Primary cardiologist: New  HPI: 54 yo male with PMH of HTN, Hyperlipidemia, substance abuse, HIV for evaluation of chest pain. Patient has had intermittent chest pain for approximately 3 years. It is in the lower breast area. It occurs with stress and typically lasts several minutes. It is not pleuritic, positional or exertional. Typically no associated symptoms.  Patient felt stressed recently and did cocaine. He then developed chest pain. There was some radiation to his shoulder and neck. Admitted by primary care and cardiology asked to evaluate.  Patient states he had a stress test in [place] in July but results are unknown.  Medications Prior to Admission  Medication Sig Dispense Refill  . amLODipine (NORVASC) 10 MG tablet Take 1 tablet (10 mg total) by mouth daily.    Marland Kitchen aspirin EC 162 MG EC tablet Take 1 tablet (162 mg total) by mouth daily. 30 tablet 0  . ATRIPLA 600-200-300 MG tablet Take 1 tab daily at bedtime 30 tablet 1  . carvedilol (COREG) 25 MG tablet Take 1 tablet (25 mg total) by mouth 2 (two) times daily with a meal. 60 tablet 0  . fluticasone (FLONASE) 50 MCG/ACT nasal spray Place 1 spray into both nostrils 2 (two) times daily. 16 g 0  . lisinopril (PRINIVIL,ZESTRIL) 10 MG tablet Take 1 tablet (10 mg total) by mouth daily.  0  . nicotine (NICODERM CQ - DOSED IN MG/24 HOURS) 21 mg/24hr patch Place 1 patch (21 mg total) onto the skin daily. 28 patch 0  . NITROSTAT 0.4 MG SL tablet Place 1 tablet (0.4 mg total) under the tongue every 5 (five) minutes as needed for chest pain.  12  . pantoprazole (PROTONIX) 40 MG tablet Take 1 tablet (40 mg total) by mouth daily. 30 tablet 0  . PARoxetine (PAXIL) 20 MG tablet Take 1 tablet (20 mg total) by mouth daily. 30 tablet 0  . rosuvastatin (CRESTOR) 20 MG tablet Take 1 tablet (20 mg total) by mouth daily.    Marland Kitchen zolpidem (AMBIEN) 5 MG tablet Take 1 tablet (5 mg total) by mouth at bedtime as needed for sleep. 30 tablet 0    No Known  Allergies   Past Medical History  Diagnosis Date  . HIV infection ([place])   . Cancer ([place])   . Hypertension   . Angina pectoris ([place])   . Scrotal cyst   . Anxiety   . Depression     Past Surgical History  Procedure Laterality Date  . Scrotal turmor removed      Social History   Social History  . Marital Status: Single    Spouse Name: N/A  . Number of Children: 4  . Years of Education: N/A   Occupational History  . Not on file.   Social History Main Topics  . Smoking status: Current Some Day Smoker -- 0.50 packs/day    Types: Cigars  . Smokeless tobacco: Not on file  . Alcohol Use: 0.6 oz/week    1 Cans of beer per week     Comment: one beer weekly  . Drug Use: No     Comment: Cocaine  . Sexual Activity: No   Other Topics Concern  . Not on file   Social History Narrative    Family History  Problem Relation Age of Onset  . Hypertension Other   . CAD Sister     ROS: Depressed but no fevers or chills, productive cough, hemoptysis, dysphasia, odynophagia, melena, hematochezia, dysuria, hematuria, rash, seizure activity,  orthopnea, PND, pedal edema, claudication. Remaining systems are negative.  Physical Exam:   Blood pressure 157/91, pulse 64, temperature 98.2 F (36.8 C), temperature source Oral, resp. rate 18, height _0  (1.803 m), weight 89 kg (196 lb 3.4 oz), SpO2 99 %.  General:  Well developed/well nourished in NAD Skin warm/dry Patient not depressed No peripheral clubbing Back-normal HEENT-normal/normal eyelids Neck supple/normal carotid upstroke bilaterally; no bruits; no JVD; no thyromegaly chest - CTA/ normal expansion CV - RRR/normal S1 and S2; no rubs or gallops;  PMI nondisplaced; 2/6 systolic murmur LSB Abdomen -NT/ND, no HSM, no mass, + bowel sounds, no bruit 2+ femoral pulses, no bruits Ext-no edema, chords, 2+ DP Neuro-grossly nonfocal  ECG Sinus bradycardia with RBBB  Results for orders placed or performed during the hospital  encounter of 02/05/15 (from the past 48 hour(s))  Basic metabolic panel     Status: Abnormal   Collection Time: 02/05/15  7:23 PM  Result Value Ref Range   Sodium 141 135 - 145 mmol/L   Potassium 4.0 3.5 - 5.1 mmol/L   Chloride 106 101 - 111 mmol/L   CO2 27 22 - 32 mmol/L   Glucose, Bld 108 (H) 65 - 99 mg/dL   BUN 24 (H) 6 - 20 mg/dL   Creatinine, Ser 0.96 0.61 - 1.24 mg/dL   Calcium 9.4 8.9 - 10.3 mg/dL   GFR calc non Af Amer >60 >60 mL/min   GFR calc Af Amer >60 >60 mL/min    Comment: (NOTE) The eGFR has been calculated using the CKD EPI equation. This calculation has not been validated in all clinical situations. eGFR's persistently <60 mL/min signify possible Chronic Kidney Disease.    Anion gap 8 5 - 15  CBC     Status: Abnormal   Collection Time: 02/05/15  7:23 PM  Result Value Ref Range   WBC 7.3 4.0 - 10.5 K/uL   RBC 3.98 (L) 4.22 - 5.81 MIL/uL   Hemoglobin 13.2 13.0 - 17.0 g/dL   HCT 40.1 39.0 - 52.0 %   MCV 100.8 (H) 78.0 - 100.0 fL   MCH 33.2 26.0 - 34.0 pg   MCHC 32.9 30.0 - 36.0 g/dL   RDW 12.0 11.5 - 15.5 %   Platelets 187 150 - 400 K/uL  Troponin I     Status: None   Collection Time: 02/05/15  7:23 PM  Result Value Ref Range   Troponin I <0.03 <0.031 ng/mL    Comment:        NO INDICATION OF MYOCARDIAL INJURY.   Ethanol     Status: None   Collection Time: 02/05/15  7:47 PM  Result Value Ref Range   Alcohol, Ethyl (B) <5 <5 mg/dL    Comment:        LOWEST DETECTABLE LIMIT FOR SERUM ALCOHOL IS 5 mg/dL FOR MEDICAL PURPOSES ONLY   Urine rapid drug screen (hosp performed)     Status: None   Collection Time: 02/05/15  9:26 PM  Result Value Ref Range   Opiates NONE DETECTED NONE DETECTED   Cocaine NONE DETECTED NONE DETECTED   Benzodiazepines NONE DETECTED NONE DETECTED   Amphetamines NONE DETECTED NONE DETECTED   Tetrahydrocannabinol NONE DETECTED NONE DETECTED   Barbiturates NONE DETECTED NONE DETECTED    Comment:        DRUG SCREEN FOR MEDICAL  PURPOSES ONLY.  IF CONFIRMATION IS NEEDED FOR ANY PURPOSE, NOTIFY LAB WITHIN 5 DAYS.        LOWEST  DETECTABLE LIMITS FOR URINE DRUG SCREEN Drug Class       Cutoff (ng/mL) Amphetamine      1000 Barbiturate      200 Benzodiazepine   388 Tricyclics       828 Opiates          300 Cocaine          300 THC              50   Troponin I     Status: None   Collection Time: 02/05/15 10:38 PM  Result Value Ref Range   Troponin I <0.03 <0.031 ng/mL    Comment:        NO INDICATION OF MYOCARDIAL INJURY.   Magnesium     Status: None   Collection Time: 02/06/15  5:12 AM  Result Value Ref Range   Magnesium 2.2 1.7 - 2.4 mg/dL  Phosphorus     Status: None   Collection Time: 02/06/15  5:12 AM  Result Value Ref Range   Phosphorus 3.6 2.5 - 4.6 mg/dL  TSH     Status: None   Collection Time: 02/06/15  5:12 AM  Result Value Ref Range   TSH 1.967 0.350 - 4.500 uIU/mL  Comprehensive metabolic panel     Status: Abnormal   Collection Time: 02/06/15  5:12 AM  Result Value Ref Range   Sodium 141 135 - 145 mmol/L   Potassium 4.0 3.5 - 5.1 mmol/L   Chloride 107 101 - 111 mmol/L   CO2 28 22 - 32 mmol/L   Glucose, Bld 96 65 - 99 mg/dL   BUN 21 (H) 6 - 20 mg/dL   Creatinine, Ser 0.86 0.61 - 1.24 mg/dL   Calcium 9.2 8.9 - 10.3 mg/dL   Total Protein 6.4 (L) 6.5 - 8.1 g/dL   Albumin 3.4 (L) 3.5 - 5.0 g/dL   AST 35 15 - 41 U/L   ALT 63 17 - 63 U/L   Alkaline Phosphatase 109 38 - 126 U/L   Total Bilirubin 0.5 0.3 - 1.2 mg/dL   GFR calc non Af Amer >60 >60 mL/min   GFR calc Af Amer >60 >60 mL/min    Comment: (NOTE) The eGFR has been calculated using the CKD EPI equation. This calculation has not been validated in all clinical situations. eGFR's persistently <60 mL/min signify possible Chronic Kidney Disease.    Anion gap 6 5 - 15  CBC     Status: Abnormal   Collection Time: 02/06/15  5:12 AM  Result Value Ref Range   WBC 6.3 4.0 - 10.5 K/uL   RBC 3.57 (L) 4.22 - 5.81 MIL/uL   Hemoglobin  11.8 (L) 13.0 - 17.0 g/dL   HCT 35.6 (L) 39.0 - 52.0 %   MCV 99.7 78.0 - 100.0 fL   MCH 33.1 26.0 - 34.0 pg   MCHC 33.1 30.0 - 36.0 g/dL   RDW 12.0 11.5 - 15.5 %   Platelets 157 150 - 400 K/uL  Troponin I     Status: None   Collection Time: 02/06/15  5:12 AM  Result Value Ref Range   Troponin I <0.03 <0.031 ng/mL    Comment:        NO INDICATION OF MYOCARDIAL INJURY.   Lipid panel     Status: Abnormal   Collection Time: 02/06/15  5:12 AM  Result Value Ref Range   Cholesterol 160 0 - 200 mg/dL   Triglycerides 138 <150 mg/dL   HDL 37 (  L) >40 mg/dL   Total CHOL/HDL Ratio 4.3 RATIO   VLDL 28 0 - 40 mg/dL   LDL Cholesterol 95 0 - 99 mg/dL    Comment:        Total Cholesterol/HDL:CHD Risk Coronary Heart Disease Risk Table                     Men   Women  1/2 Average Risk   3.4   3.3  Average Risk       5.0   4.4  2 X Average Risk   9.6   7.1  3 X Average Risk  23.4   11.0        Use the calculated Patient Ratio above and the CHD Risk Table to determine the patient's CHD Risk.        ATP III CLASSIFICATION (LDL):  <100     mg/dL   Optimal  100-129  mg/dL   Near or Above                    Optimal  130-159  mg/dL   Borderline  160-189  mg/dL   High  >190     mg/dL   Very High Performed at [facility]   Troponin I     Status: None   Collection Time: 02/06/15 12:15 PM  Result Value Ref Range   Troponin I <0.03 <0.031 ng/mL    Comment:        NO INDICATION OF MYOCARDIAL INJURY.   Troponin I     Status: None   Collection Time: 02/06/15  6:15 PM  Result Value Ref Range   Troponin I <0.03 <0.031 ng/mL    Comment:        NO INDICATION OF MYOCARDIAL INJURY.   CBC     Status: Abnormal   Collection Time: 02/07/15  6:18 AM  Result Value Ref Range   WBC 5.5 4.0 - 10.5 K/uL   RBC 3.68 (L) 4.22 - 5.81 MIL/uL   Hemoglobin 12.1 (L) 13.0 - 17.0 g/dL   HCT 36.9 (L) 39.0 - 52.0 %   MCV 100.3 (H) 78.0 - 100.0 fL   MCH 32.9 26.0 - 34.0 pg   MCHC 32.8 30.0 - 36.0 g/dL    RDW 11.9 11.5 - 15.5 %   Platelets 177 150 - 400 K/uL  Basic metabolic panel     Status: Abnormal   Collection Time: 02/07/15  6:18 AM  Result Value Ref Range   Sodium 141 135 - 145 mmol/L   Potassium 4.1 3.5 - 5.1 mmol/L   Chloride 107 101 - 111 mmol/L   CO2 28 22 - 32 mmol/L   Glucose, Bld 95 65 - 99 mg/dL   BUN 14 6 - 20 mg/dL   Creatinine, Ser 0.98 0.61 - 1.24 mg/dL   Calcium 8.8 (L) 8.9 - 10.3 mg/dL   GFR calc non Af Amer >60 >60 mL/min   GFR calc Af Amer >60 >60 mL/min    Comment: (NOTE) The eGFR has been calculated using the CKD EPI equation. This calculation has not been validated in all clinical situations. eGFR's persistently <60 mL/min signify possible Chronic Kidney Disease.    Anion gap 6 5 - 15    Dg Chest 2 View  02/05/2015  CLINICAL DATA:  Patient with chest pain.  History of anxiety. EXAM: CHEST  2 VIEW COMPARISON:  Chest radiograph 02/02/2015. FINDINGS: Normal cardiac and mediastinal contours. No consolidative pulmonary  opacities. No pleural effusion or pneumothorax. Regional skeleton is unremarkable. IMPRESSION: No active cardiopulmonary disease. Electronically Signed   By: Lovey Newcomer M.D.   On: 02/05/2015 19:50    Assessment/Plan 1 chest pain-symptoms are atypical. They only occur with stress and he has no symptoms with exertion.Also note the patient did recent cocaine. His electrocardiogram shows no ST changes and enzymes are negative. He had a recent stress test in [place] in July.  Would obtain results. If negative would not pursue further cardiac testing. 2 Hypertension-continue preadmission medications. Lisinopril can be advanced as needed for blood pressure control. 3 hyperlipidemia-continue statin. 4 tobacco abuse-patient counseled on discontinuing. 5 Depression-management per primary care. 6 substance abuse--patient needs to discontinue. 7 HIV  Kirk Ruths MD 02/07/2015, 7:36 AM

## 2015-02-07 NOTE — Progress Notes (Signed)
Sitter discontinued by Dr. Grandville Silos. Pt has been calm, cooperative and very interactive with staff throughout the day. Pt states he feels better than when he came and has a more positive outlook. Pt denies SI or HI at this time.

## 2015-02-08 DIAGNOSIS — F329 Major depressive disorder, single episode, unspecified: Secondary | ICD-10-CM | POA: Diagnosis not present

## 2015-02-08 DIAGNOSIS — Z21 Asymptomatic human immunodeficiency virus [HIV] infection status: Secondary | ICD-10-CM | POA: Diagnosis not present

## 2015-02-08 DIAGNOSIS — R079 Chest pain, unspecified: Secondary | ICD-10-CM | POA: Diagnosis not present

## 2015-02-08 DIAGNOSIS — I10 Essential (primary) hypertension: Secondary | ICD-10-CM | POA: Diagnosis not present

## 2015-02-08 DIAGNOSIS — R072 Precordial pain: Secondary | ICD-10-CM | POA: Diagnosis not present

## 2015-02-08 LAB — BASIC METABOLIC PANEL
ANION GAP: 7 (ref 5–15)
BUN: 17 mg/dL (ref 6–20)
CALCIUM: 8.9 mg/dL (ref 8.9–10.3)
CO2: 26 mmol/L (ref 22–32)
Chloride: 106 mmol/L (ref 101–111)
Creatinine, Ser: 0.93 mg/dL (ref 0.61–1.24)
GLUCOSE: 94 mg/dL (ref 65–99)
POTASSIUM: 3.8 mmol/L (ref 3.5–5.1)
SODIUM: 139 mmol/L (ref 135–145)

## 2015-02-08 LAB — CBC
HEMATOCRIT: 37.8 % — AB (ref 39.0–52.0)
HEMOGLOBIN: 12.5 g/dL — AB (ref 13.0–17.0)
MCH: 33.1 pg (ref 26.0–34.0)
MCHC: 33.1 g/dL (ref 30.0–36.0)
MCV: 100 fL (ref 78.0–100.0)
Platelets: 183 10*3/uL (ref 150–400)
RBC: 3.78 MIL/uL — ABNORMAL LOW (ref 4.22–5.81)
RDW: 11.8 % (ref 11.5–15.5)
WBC: 5.8 10*3/uL (ref 4.0–10.5)

## 2015-02-08 LAB — T-HELPER CELLS (CD4) COUNT (NOT AT ARMC)

## 2015-02-08 LAB — HEMOGLOBIN A1C
HEMOGLOBIN A1C: 5.3 % (ref 4.8–5.6)
MEAN PLASMA GLUCOSE: 105 mg/dL

## 2015-02-08 MED ORDER — LISINOPRIL 20 MG PO TABS
20.0000 mg | ORAL_TABLET | Freq: Every day | ORAL | Status: DC
  Administered 2015-02-08 – 2015-02-10 (×3): 20 mg via ORAL
  Filled 2015-02-08 (×3): qty 1

## 2015-02-08 NOTE — Progress Notes (Signed)
[facility] PROGRESS NOTE  Nathan Santana [id number] DOB: [birth year] DOA: 02/05/2015 PCP: PROVIDER NOT IN SYSTEM  Assessment/Plan: #1 chest pain Patient had presented with chest pain. Patient with prior history of coronary artery disease that was followed in the past at [facility] however patient stated he moved to [place]. Patient with history of hypertension, angina, anxiety and depression. Fasting lipid panel with LDL of 95. Patient described chest pain as a fluttering. Cardiac enzymes negative 3. Chest x-ray negative. EKG with right bundle branch block. 2-D echo with EF of 50-55% with no wall motion abnormalities. Continue aspirin, Coreg, lisinopril, Crestor. Patient has been seen in consutlation by cardiology and recommending awaiting outside records. Cardiology ff.   #2 probable hyperlipidemia Lipid panel with total cholesterol of 160, LDL of 95, HDL of 37. Continue home dose Crestor.  #3 history of HIV CD4 count pending. Continue atripla.  #4 history of coronary artery disease See problem #1.  #5 major depressive disorder/suicidal ideation Patient with complaints of depression and feels life is not worth living. Patient was just discharged from [facility]. Continue current regimen of Paxil. Psychiatry ff.  #6 hypertension Stable. Continue lisinopril, Coreg.  #7 Scrotal/Testicular pain [place] of scrotum unremarkable. Consult with urology for further evaluation and management.   #8 prophylaxis Pepcid for GI prophylaxis. Lovenox for DVT prophylaxis.   Code Status: Full Family Communication: Updated patient. No family present. Disposition Plan: Pending cardiac evaluation and psychiatric evaluation. Patient also homeless social work consultation pending.   Consultants:  Psychiatry: Dr.Akintayo 02/06/2015  Cardiology: Dr Stanford Breed 02/07/2015  Procedures:  2-D echo 02/06/2015  Chest x-ray 02/05/2015  US scrotum  02/07/2015  Antibiotics:  None  HPI/Subjective: Patient c/o right scrotal pain with no significant improvement. Patient described chest pain now is a fluttering. No SOB. No SI.  Objective: Filed Vitals:   02/08/15 0829 02/08/15 1113  BP: 159/77 149/89  Pulse:  65  Temp:    Resp:  20    Intake/Output Summary (Last 24 hours) at 02/08/15 1151 Last data filed at 02/08/15 0942  Gross per 24 hour  Intake    963 ml  Output      0 ml  Net    963 ml   Filed Weights   02/06/15 0003  Weight: 89 kg (196 lb 3.4 oz)    Exam:   General:  NAD. Patient depressed  Cardiovascular: RRR  Respiratory: CTAB  Abdomen: Soft, nontender, nondistended, positive bowel sounds  Musculoskeletal: No clubbing cyanosis or edema.  Data Reviewed: Basic Metabolic Panel:  Recent Labs Lab 02/02/15 1405 02/05/15 1923 02/06/15 0512 02/07/15 0618 02/08/15 0554  NA 137 141 141 141 139  K 4.4 4.0 4.0 4.1 3.8  CL 106 106 107 107 106  CO2 26 27 28 28 26   GLUCOSE 114* 108* 96 95 94  BUN 16 24* 21* 14 17  CREATININE 0.98 0.96 0.86 0.98 0.93  CALCIUM 9.1 9.4 9.2 8.8* 8.9  MG  --   --  2.2  --   --   PHOS  --   --  3.6  --   --    Liver Function Tests:  Recent Labs Lab 02/06/15 0512  AST 35  ALT 63  ALKPHOS 109  BILITOT 0.5  PROT 6.4*  ALBUMIN 3.4*   No results for input(s): LIPASE, AMYLASE in the last 168 hours. No results for input(s): AMMONIA in the last 168 hours. CBC:  Recent Labs Lab 02/02/15 1405 02/05/15 1923 02/06/15 0512 02/07/15 0618 02/08/15 [id number]  WBC 7.7 7.3 6.3 5.5 5.8  NEUTROABS 5.1  --   --   --   --   HGB 12.5* 13.2 11.8* 12.1* 12.5*  HCT 36.7* 40.1 35.6* 36.9* 37.8*  MCV 98.1 100.8* 99.7 100.3* 100.0  PLT 137* 187 157 177 183   Cardiac Enzymes:  Recent Labs Lab 02/05/15 1923 02/05/15 2238 02/06/15 0512 02/06/15 1215 02/06/15 1815  TROPONINI <0.03 <0.03 <0.03 <0.03 <0.03   BNP (last 3 results) No results for input(s): BNP in the last 8760  hours.  ProBNP (last 3 results) No results for input(s): PROBNP in the last 8760 hours.  CBG: No results for input(s): GLUCAP in the last 168 hours.  No results found for this or any previous visit (from the past 240 hour(s)).   Studies: US Scrotum  02/07/2015  CLINICAL DATA:  Right-sided testicular pain for 3 months. History of HIV and hypertension. EXAM: SCROTAL ULTRASOUND DOPPLER ULTRASOUND OF THE TESTICLES TECHNIQUE: Complete ultrasound examination of the testicles, epididymis, and other scrotal structures was performed. Color and spectral Doppler ultrasound were also utilized to evaluate blood flow to the testicles. COMPARISON:  None. FINDINGS: Right testicle Measurements: 4.4 x 2.3 x 2.7 cm. No mass or microlithiasis visualized. Single echogenic focus demonstrated measuring 2 mm, likely focal calcification. Left testicle Measurements: 4.3 x 2.2 x 3.3 cm. No mass or microlithiasis visualized. Right epididymis: Cyst or spermatocele measuring about 1.2 cm maximal diameter. Left epididymis: Cyst or spermatocele measuring about 9 mm diameter. Hydrocele:  Minimal bilateral hydroceles. Varicocele:  None visualized. Pulsed Doppler interrogation of both testes demonstrates normal low resistance arterial and venous waveforms bilaterally. Normal homogeneous flow demonstrated in both testes and epididymides on color flow Doppler imaging. IMPRESSION: No evidence of testicular mass, inflammation, or torsion. Electronically Signed   By: Lucienne Capers M.D.   On: 02/07/2015 22:21   [place] Art/ven Flow Abd Pelv Doppler  02/07/2015  CLINICAL DATA:  Right-sided testicular pain for 3 months. History of HIV and hypertension. EXAM: SCROTAL ULTRASOUND DOPPLER ULTRASOUND OF THE TESTICLES TECHNIQUE: Complete ultrasound examination of the testicles, epididymis, and other scrotal structures was performed. Color and spectral Doppler ultrasound were also utilized to evaluate blood flow to the testicles. COMPARISON:  None.  FINDINGS: Right testicle Measurements: 4.4 x 2.3 x 2.7 cm. No mass or microlithiasis visualized. Single echogenic focus demonstrated measuring 2 mm, likely focal calcification. Left testicle Measurements: 4.3 x 2.2 x 3.3 cm. No mass or microlithiasis visualized. Right epididymis: Cyst or spermatocele measuring about 1.2 cm maximal diameter. Left epididymis: Cyst or spermatocele measuring about 9 mm diameter. Hydrocele:  Minimal bilateral hydroceles. Varicocele:  None visualized. Pulsed Doppler interrogation of both testes demonstrates normal low resistance arterial and venous waveforms bilaterally. Normal homogeneous flow demonstrated in both testes and epididymides on color flow Doppler imaging. IMPRESSION: No evidence of testicular mass, inflammation, or torsion. Electronically Signed   By: Lucienne Capers M.D.   On: 02/07/2015 22:21    Scheduled Meds: . amLODipine  10 mg Oral Daily  . aspirin EC  162 mg Oral Daily  . carvedilol  25 mg Oral BID WC  . efavirenz-emtricitabine-tenofovir  1 tablet Oral QHS  . enoxaparin (LOVENOX) injection  40 mg Subcutaneous Q24H  . famotidine  40 mg Oral Daily  . ibuprofen  600 mg Oral TID  . lisinopril  20 mg Oral Daily  . pantoprazole  40 mg Oral Daily  . PARoxetine  40 mg Oral Daily  . rosuvastatin  20 mg Oral Daily  .  sodium chloride  3 mL Intravenous Q12H  . traZODone  50 mg Oral QHS   Continuous Infusions:   Principal Problem:   Chest pain Active Problems:   Major depressive disorder, recurrent, severe without psychotic features (HCC)   HIV (human immunodeficiency virus infection) ([place])   CAD (coronary artery disease)   Suicidal ideations   Hypertension   Anxiety   Testicular pain, right    Time spent: 26 minutes    Nathan Santana M.D. [facility] Pager [phone]. If 7PM-7AM, please contact night-coverage at [email or web address], password [facility] 02/08/2015, 11:51 AM

## 2015-02-08 NOTE — Progress Notes (Signed)
    Subjective: He reports having some fluttering in his chest.   Objective: Vital signs in last 24 hours: Temp:  [97.8 F (36.6 C)-98.3 F (36.8 C)] 98.1 F (36.7 C) (11/21 0545) Pulse Rate:  [60-73] 63 (11/21 0545) Resp:  [18-20] 18 (11/21 0545) BP: (145-154)/(81-92) 148/82 mmHg (11/21 0545) SpO2:  [100 %] 100 % (11/21 0545) Last BM Date: 02/07/15  Intake/Output from previous day: 11/20 0701 - 11/21 0700 In: 1440 [P.O.:1440] Out: -  Intake/Output this shift:    Medications Scheduled Meds: . amLODipine  10 mg Oral Daily  . aspirin EC  162 mg Oral Daily  . carvedilol  25 mg Oral BID WC  . efavirenz-emtricitabine-tenofovir  1 tablet Oral QHS  . enoxaparin (LOVENOX) injection  40 mg Subcutaneous Q24H  . famotidine  40 mg Oral Daily  . ibuprofen  600 mg Oral TID  . lisinopril  10 mg Oral Daily  . pantoprazole  40 mg Oral Daily  . PARoxetine  40 mg Oral Daily  . rosuvastatin  20 mg Oral Daily  . sodium chloride  3 mL Intravenous Q12H  . traZODone  50 mg Oral QHS   Continuous Infusions:  PRN Meds:.acetaminophen **OR** acetaminophen, HYDROcodone-acetaminophen, ondansetron **OR** ondansetron (ZOFRAN) IV  PE: General appearance: alert, cooperative and no distress Lungs: clear to auscultation bilaterally Heart: regular rate and rhythm, S1, S2 normal, no murmur, click, rub or gallop Extremities: No LEE Pulses: 2+ and symmetric Skin: Warm and dry Neurologic: Grossly normal  Lab Results:   Recent Labs  02/06/15 0512 02/07/15 0618 02/08/15 0554  WBC 6.3 5.5 5.8  HGB 11.8* 12.1* 12.5*  HCT 35.6* 36.9* 37.8*  PLT 157 177 183   BMET  Recent Labs  02/06/15 0512 02/07/15 0618 02/08/15 0554  NA 141 141 139  K 4.0 4.1 3.8  CL 107 107 106  CO2 28 28 26   GLUCOSE 96 95 94  BUN 21* 14 17  CREATININE 0.86 0.98 0.93  CALCIUM 9.2 8.8* 8.9   PT/INR No results for input(s): LABPROT, INR in the last 72 hours. Cholesterol  Recent Labs  02/06/15 0512  CHOL 160     Cardiac Enzymes Invalid input(s): TROPONIN,  CKMB  Studies/Results: Study Conclusions  - Left ventricle: The cavity size was normal. There was moderate concentric hypertrophy. Systolic function was normal. The estimated ejection fraction was in the range of 50% to 55%. Wall motion was normal; there were no regional wall motion abnormalities. - Mitral valve: There was mild regurgitation. - Left atrium: The atrium was mildly to moderately dilated. - Pericardium, extracardiac: A trivial pericardial effusion was identified.   Assessment/Plan     Chest pain The patient ruled out for MI.  EF 50-55% on echo.  No wall motion abn.  Mild MR. LA mild ot mod dilation. Trivial pericardial effusion.  ASA, Coreg, lisinpril, crestor.  Awaiting stress test records from [place].    Major depressive disorder, recurrent, severe without psychotic features ([place])   HIV (human immunodeficiency virus infection) ([place])   CAD (coronary artery disease)   Suicidal ideations   Hypertension  Consider increasing lisinopril.    Anxiety   Testicular pain, right   Tobacco abuse       HAGER, BRYAN PA-C 02/08/2015 7:45 AM  As above; patient seen and examined; no chest pain or dyspnea; would obtain results of recent stress test in [facility]; if negative, no plans for further cardiac [facility]. Kirk Ruths

## 2015-02-08 NOTE — Progress Notes (Signed)
Heart rate in the 50s coreg 25mg  due, notified Dr. Grandville Silos notified and stated to hold coreg in AM. Will continue to assess patient.

## 2015-02-08 NOTE — Consult Note (Signed)
2:39 PM   Nathan Santana July 04, 1960 [id number]  Referring provider: Dr. Irine Santana  Chief Complaint  Patient presents with  . Medical Clearance  . Chest Pain    HPI: The patient is a 54 year old gentleman with a past medical history that includes resection of right testicular lesion 2 who presents to the hospital with chest pain.  Urology was consulted for right testicular pain.  The patient has a history of resection of 2 lesions on his testicle. 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  He was followed for this "cancer" and was told he was cancer free a number of years ago. He then in June 2016 underwent a repeat resection for cyst. Since the time of his cyst removal on his right testicle in June 2016, he has had chronic right testicular pain. This is intermittent and sharp. The pain radiates from his groin into his testicle. He has not had any infections of his testicle. He's had a negative scrotal ultrasound this visit. He does have HIV, but is adherent to his therapy.   PMH: Past Medical History  Diagnosis Date  . HIV infection ([place])   . Cancer ([place])   . Hypertension   . Angina pectoris ([place])   . Scrotal cyst   . Anxiety   . Depression     Surgical History: Past Surgical History  Procedure Laterality Date  . Scrotal turmor removed      Home Medications:    Medication List    ASK your doctor about these medications        amLODipine 10 MG tablet  Commonly known as:  NORVASC  Take 1 tablet (10 mg total) by mouth daily.     aspirin 162 MG EC tablet  Take 1 tablet (162 mg total) by mouth daily.     ATRIPLA 600-200-300 MG tablet  Generic drug:  efavirenz-emtricitabine-tenofovir  Take 1 tab daily at bedtime     carvedilol 25 MG tablet  Commonly known as:  COREG  Take 1 tablet (25 mg total) by mouth 2 (two) times daily with a meal.     fluticasone 50 MCG/ACT nasal spray  Commonly  known as:  FLONASE  Place 1 spray into both nostrils 2 (two) times daily.     lisinopril 10 MG tablet  Commonly known as:  PRINIVIL,ZESTRIL  Take 1 tablet (10 mg total) by mouth daily.     nicotine 21 mg/24hr patch  Commonly known as:  NICODERM CQ - dosed in mg/24 hours  Place 1 patch (21 mg total) onto the skin daily.     NITROSTAT 0.4 MG SL tablet  Generic drug:  nitroGLYCERIN  Place 1 tablet (0.4 mg total) under the tongue every 5 (five) minutes as needed for chest pain.     pantoprazole 40 MG tablet  Commonly known as:  PROTONIX  Take 1 tablet (40 mg total) by mouth daily.     PARoxetine 20 MG tablet  Commonly known as:  PAXIL  Take 1 tablet (20 mg total) by mouth daily.     rosuvastatin 20 MG tablet  Commonly known as:  CRESTOR  Take 1 tablet (20 mg total) by mouth daily.     zolpidem 5 MG tablet  Commonly known as:  AMBIEN  Take 1 tablet (5 mg total) by mouth at bedtime as needed for sleep.        Allergies: No Known Allergies  Family History:  Family History  Problem Relation Age of Onset  . Hypertension Other   . CAD Sister     Social History:  reports that he has been smoking Cigars.  He does not have any smokeless tobacco history on file. He reports that he drinks about 0.6 oz of alcohol per week. He reports that he does not use illicit drugs.  ROS: 12 point ROS as per HPI but otherwise negative                                         Physical Exam: BP 149/89 mmHg  Pulse 65  Temp(Src) 98.1 F (36.7 C) (Oral)  Resp 20  Ht 5\' 11"  (1.803 m)  Wt 196 lb 3.4 oz (89 kg)  BMI 27.38 kg/m2  SpO2 100%  Constitutional:  Alert and oriented, No acute distress. HEENT: [name], moist mucus membranes.  Trachea midline, no masses. Cardiovascular: No clubbing, cyanosis, or edema. Respiratory: Normal respiratory effort, no increased work of breathing. GI: Abdomen is soft, nontender, nondistended, no abdominal masses GU: No CVA tenderness.  Normal phallus. Testicles descended equally bilaterally. Nontender to palpation. No masses. DRE: Smooth, 2+, no nodules, nontender palpation. Skin: No rashes, bruises or suspicious lesions. Lymph: No cervical or inguinal adenopathy. Neurologic: Grossly intact, no focal deficits, moving all 4 extremities. Psychiatric: Normal mood and affect.  Laboratory Data: Lab Results  Component Value Date   WBC 5.8 02/08/2015   HGB 12.5* 02/08/2015   HCT 37.8* 02/08/2015   MCV 100.0 02/08/2015   PLT 183 02/08/2015    Lab Results  Component Value Date   CREATININE 0.93 02/08/2015    No results found for: PSA  No results found for: TESTOSTERONE  Lab Results  Component Value Date   HGBA1C 5.3 02/06/2015    Urinalysis No results found for: COLORURINE, APPEARANCEUR, LABSPEC, [place], GLUCOSEU, HGBUR, BILIRUBINUR, KETONESUR, PROTEINUR, UROBILINOGEN, NITRITE, LEUKOCYTESUR  Pertinent Imaging: Negative scrotal ultrasound  Assessment & Plan:    The patient has chronic orchialgia likely secondary to his recent right testicular cyst removal in June 2016. There is no acute urological intervention for this at this time. He can follow-up as an outpatient if desired.  1. Chronic orchalgia -No acute urologic intervention -Recommend course of NSAID therapy (Advil 600 mg tid for 2 weeks) -Ice scrotum prn -f/u as outpatient for further work up  @DIAGMED @  No Follow-up on file.  Nathan Retort, MD

## 2015-02-08 NOTE — Progress Notes (Addendum)
Pt was lying in bed and awake when I arrived. He stated he had been wanting to see a Chaplain. He noted that he was doing better and no longer had to have anyone sitting with him. Pt talked at length about his journey to this hospital stay. He said he was feeling fearful and just out of control. He described his family as unsupportive saying his brother with whom he lived for a while is an alcoholic and when he drank he would put him out. His described his sister as not wanting to be bothered. He shared that he is driving sister's car and has taken payments on the car and said he hopes she doesn't take the car back. Pt said he has been thinking about God lately. He said he had been living w/SO for about 10 years and decided he wanted to get his life together. Pt said they broke up and he didn't know what to do. 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 He said he wanted to do what's right. He described doing the right thing as not drinking, not doing drugs and stop chasing women. He voluntarily admitted the breakup of SO was his fault. He repeatedly said he didn't know what to do next saying he was broken.  Pt indicated he has been here before and [place] and cannot go back w/n 30 days. He said he is praying for a place to go when discharged from [facility]. Chaplain provided listening support and presence along w/some discussion about how doing what's right looks for pt. Please page if additional support is needed. Cleveland Holder   02/07/15 2010  Clinical Encounter Type  Visited With Patient

## 2015-02-09 DIAGNOSIS — F419 Anxiety disorder, unspecified: Secondary | ICD-10-CM | POA: Diagnosis not present

## 2015-02-09 DIAGNOSIS — R072 Precordial pain: Secondary | ICD-10-CM | POA: Diagnosis not present

## 2015-02-09 DIAGNOSIS — Z21 Asymptomatic human immunodeficiency virus [HIV] infection status: Secondary | ICD-10-CM | POA: Diagnosis not present

## 2015-02-09 DIAGNOSIS — I10 Essential (primary) hypertension: Secondary | ICD-10-CM | POA: Diagnosis not present

## 2015-02-09 DIAGNOSIS — N50819 Testicular pain, unspecified: Secondary | ICD-10-CM | POA: Diagnosis present

## 2015-02-09 DIAGNOSIS — R079 Chest pain, unspecified: Secondary | ICD-10-CM | POA: Diagnosis not present

## 2015-02-09 LAB — BASIC METABOLIC PANEL
ANION GAP: 7 (ref 5–15)
BUN: 19 mg/dL (ref 6–20)
CALCIUM: 9 mg/dL (ref 8.9–10.3)
CO2: 27 mmol/L (ref 22–32)
Chloride: 105 mmol/L (ref 101–111)
Creatinine, Ser: 0.79 mg/dL (ref 0.61–1.24)
GLUCOSE: 94 mg/dL (ref 65–99)
Potassium: 4 mmol/L (ref 3.5–5.1)
SODIUM: 139 mmol/L (ref 135–145)

## 2015-02-09 LAB — T-HELPER CELLS (CD4) COUNT (NOT AT ARMC)
CD4 T CELL ABS: 450 /uL (ref 400–2700)
CD4 T CELL HELPER: 34 % (ref 33–55)

## 2015-02-09 NOTE — Progress Notes (Signed)
    Subjective: Some infrequent, very light, chest discomfort.  Objective: Vital signs in last 24 hours: Temp:  [98 F (36.7 C)-98.2 F (36.8 C)] 98 F (36.7 C) (11/22 0510) Pulse Rate:  [60-65] 60 (11/22 0510) Resp:  [18-20] 18 (11/22 0510) BP: (141-159)/(67-89) 154/82 mmHg (11/22 0510) SpO2:  [99 %-100 %] 99 % (11/22 0510) Last BM Date: 02/07/15  Intake/Output from previous day: 11/21 0701 - 11/22 0700 In: 947 [P.O.:944; I.V.:3] Out: -  Intake/Output this shift:    Medications Scheduled Meds: . amLODipine  10 mg Oral Daily  . aspirin EC  162 mg Oral Daily  . carvedilol  25 mg Oral BID WC  . efavirenz-emtricitabine-tenofovir  1 tablet Oral QHS  . enoxaparin (LOVENOX) injection  40 mg Subcutaneous Q24H  . famotidine  40 mg Oral Daily  . ibuprofen  600 mg Oral TID  . lisinopril  20 mg Oral Daily  . pantoprazole  40 mg Oral Daily  . PARoxetine  40 mg Oral Daily  . rosuvastatin  20 mg Oral Daily  . sodium chloride  3 mL Intravenous Q12H  . traZODone  50 mg Oral QHS   Continuous Infusions:  PRN Meds:.acetaminophen **OR** acetaminophen, HYDROcodone-acetaminophen, ondansetron **OR** ondansetron (ZOFRAN) IV  PE: General appearance: alert, cooperative and no distress Lungs: clear to auscultation bilaterally Heart: regular rate and rhythm, S1, S2 normal, no murmur, click, rub or gallop Extremities: No LEE Pulses: 2+ and symmetric Skin: Warm and dry Neurologic: Grossly normal  Lab Results:   Recent Labs  02/07/15 0618 02/08/15 0554  WBC 5.5 5.8  HGB 12.1* 12.5*  HCT 36.9* 37.8*  PLT 177 183   BMET  Recent Labs  02/07/15 0618 02/08/15 0554 02/09/15 0529  NA 141 139 139  K 4.1 3.8 4.0  CL 107 106 105  CO2 28 26 27   GLUCOSE 95 94 94  BUN 14 17 19   CREATININE 0.98 0.93 0.79  CALCIUM 8.8* 8.9 9.0    Assessment/Plan  Chest pain The patient ruled out for MI. EF 50-55% on echo. No wall motion abn. Mild MR. LA mild ot mod dilation. Trivial  pericardial effusion. ASA, Coreg, lisinpril, crestor. Awaiting stress test records from [place]. The request was faxed.    Major depressive disorder, recurrent, severe without psychotic features ([place])  HIV (human immunodeficiency virus infection) ([place])  CAD (coronary artery disease)  Suicidal ideations  Hypertension  Lisinopril increased to 20 yesterday.   Anxiety  Testicular pain, right  Tobacco abuse   Chronic Orchalgia   HAGER, BRYAN PA-C 02/09/2015 7:14 AM  As above; patient with minimal CP; all enzymes negative; part of outside records available; multiple nuclear studies for CP negative in past; recent study 7/16 not available yet; results to be sent; if negative, no further cardiac wu. Please call with questions. Kirk Ruths

## 2015-02-09 NOTE — Progress Notes (Signed)
[facility] PROGRESS NOTE  Nathan Santana [id number] DOB: 02-05-1961 DOA: 02/05/2015 PCP: PROVIDER NOT IN SYSTEM  Assessment/Plan: #1 chest pain Patient had presented with chest pain. Patient with prior history of coronary artery disease that was followed in the past at [facility] however patient stated he moved to [place]. Patient with history of hypertension, angina, anxiety and depression. Fasting lipid panel with LDL of 95. Patient described chest pain as a fluttering. Cardiac enzymes negative 3. Chest x-ray negative. EKG with right bundle branch block. 2-D echo with EF of 50-55% with no wall motion abnormalities. Continue aspirin, Coreg, lisinopril, Crestor. Patient has been seen in consutlation by cardiology and recommending awaiting outside records. Records from outside show multiple nuclear stress studies which have been negative in the past. Recent study from 7/ 2016 not available. Will request recent study to be faxed. Per cardiology if recent studies negative no further cardiac workup is needed. Cardiology ff.   #2 probable hyperlipidemia Lipid panel with total cholesterol of 160, LDL of 95, HDL of 37. Continue home dose Crestor.  #3 history of HIV CD4 count at 450. Continue atripla.  #4 history of coronary artery disease See problem #1.  #5 major depressive disorder/suicidal ideation Patient with complaints of depression and feels life is not worth living on admission. Patient was just discharged [facility]. Continue current regimen of Paxil. Will need outpatient follow-up. Psychiatry ff.  #6 hypertension Stable. Continue lisinopril, Coreg.  #7 Scrotal/Testicular pain/ Chronic orchalgia [place] of scrotum unremarkable. Patient has been seen by urology and feel patient has chronic orchalgia likely secondary to recent right testicular cyst removal in June 2016. No acute urological intervention needed at this time. NSAIDs. Ice to the scrotum as needed.   #8  prophylaxis Pepcid for GI prophylaxis. Lovenox for DVT prophylaxis.   Code Status: Full Family Communication: Updated patient. No family present. Disposition Plan: Pending cardiac evaluation and psychiatric evaluation. Patient also homeless social work consultation pending.   Consultants:  Psychiatry: Dr.Akintayo 02/06/2015  Cardiology: Dr Stanford Breed 02/07/2015  Procedures:  2-D echo 02/06/2015  Chest x-ray 02/05/2015  US scrotum 02/07/2015  Antibiotics:  None  HPI/Subjective: Patient c/o right scrotal pain with no significant improvement. Patient described chest pain now is less intense. No SOB. No SI.  Objective: Filed Vitals:   02/09/15 0510 02/09/15 0801  BP: 154/82   Pulse: 60 67  Temp: 98 F (36.7 C)   Resp: 18     Intake/Output Summary (Last 24 hours) at 02/09/15 1211 Last data filed at 02/08/15 2015  Gross per 24 hour  Intake    480 ml  Output      0 ml  Net    480 ml   Filed Weights   02/06/15 0003  Weight: 89 kg (196 lb 3.4 oz)    Exam:   General:  NAD. Patient depressed  Cardiovascular: RRR  Respiratory: CTAB  Abdomen: Soft, nontender, nondistended, positive bowel sounds  Musculoskeletal: No clubbing cyanosis or edema.  Data Reviewed: Basic Metabolic Panel:  Recent Labs Lab 02/05/15 1923 02/06/15 0512 02/07/15 0618 02/08/15 0554 02/09/15 0529  NA 141 141 141 139 139  K 4.0 4.0 4.1 3.8 4.0  CL 106 107 107 106 105  CO2 27 28 28 26 27   GLUCOSE 108* 96 95 94 94  BUN 24* 21* 14 17 19   CREATININE 0.96 0.86 0.98 0.93 0.79  CALCIUM 9.4 9.2 8.8* 8.9 9.0  MG  --  2.2  --   --   --  PHOS  --  3.6  --   --   --    Liver Function Tests:  Recent Labs Lab 02/06/15 0512  AST 35  ALT 63  ALKPHOS 109  BILITOT 0.5  PROT 6.4*  ALBUMIN 3.4*   No results for input(s): LIPASE, AMYLASE in the last 168 hours. No results for input(s): AMMONIA in the last 168 hours. CBC:  Recent Labs Lab 02/02/15 1405 02/05/15 1923 02/06/15 0512  02/07/15 0618 02/08/15 0554  WBC 7.7 7.3 6.3 5.5 5.8  NEUTROABS 5.1  --   --   --   --   HGB 12.5* 13.2 11.8* 12.1* 12.5*  HCT 36.7* 40.1 35.6* 36.9* 37.8*  MCV 98.1 100.8* 99.7 100.3* 100.0  PLT 137* 187 157 177 183   Cardiac Enzymes:  Recent Labs Lab 02/05/15 1923 02/05/15 2238 02/06/15 0512 02/06/15 1215 02/06/15 1815  TROPONINI <0.03 <0.03 <0.03 <0.03 <0.03   BNP (last 3 results) No results for input(s): BNP in the last 8760 hours.  ProBNP (last 3 results) No results for input(s): PROBNP in the last 8760 hours.  CBG: No results for input(s): GLUCAP in the last 168 hours.  No results found for this or any previous visit (from the past 240 hour(s)).   Studies: US Scrotum  02/07/2015  CLINICAL DATA:  Right-sided testicular pain for 3 months. History of HIV and hypertension. EXAM: SCROTAL ULTRASOUND DOPPLER ULTRASOUND OF THE TESTICLES TECHNIQUE: Complete ultrasound examination of the testicles, epididymis, and other scrotal structures was performed. Color and spectral Doppler ultrasound were also utilized to evaluate blood flow to the testicles. COMPARISON:  None. FINDINGS: Right testicle Measurements: 4.4 x 2.3 x 2.7 cm. No mass or microlithiasis visualized. Single echogenic focus demonstrated measuring 2 mm, likely focal calcification. Left testicle Measurements: 4.3 x 2.2 x 3.3 cm. No mass or microlithiasis visualized. Right epididymis: Cyst or spermatocele measuring about 1.2 cm maximal diameter. Left epididymis: Cyst or spermatocele measuring about 9 mm diameter. Hydrocele:  Minimal bilateral hydroceles. Varicocele:  None visualized. Pulsed Doppler interrogation of both testes demonstrates normal low resistance arterial and venous waveforms bilaterally. Normal homogeneous flow demonstrated in both testes and epididymides on color flow Doppler imaging. IMPRESSION: No evidence of testicular mass, inflammation, or torsion. Electronically Signed   By: Lucienne Capers M.D.   On:  02/07/2015 22:21   [place] Art/ven Flow Abd Pelv Doppler  02/07/2015  CLINICAL DATA:  Right-sided testicular pain for 3 months. History of HIV and hypertension. EXAM: SCROTAL ULTRASOUND DOPPLER ULTRASOUND OF THE TESTICLES TECHNIQUE: Complete ultrasound examination of the testicles, epididymis, and other scrotal structures was performed. Color and spectral Doppler ultrasound were also utilized to evaluate blood flow to the testicles. COMPARISON:  None. FINDINGS: Right testicle Measurements: 4.4 x 2.3 x 2.7 cm. No mass or microlithiasis visualized. Single echogenic focus demonstrated measuring 2 mm, likely focal calcification. Left testicle Measurements: 4.3 x 2.2 x 3.3 cm. No mass or microlithiasis visualized. Right epididymis: Cyst or spermatocele measuring about 1.2 cm maximal diameter. Left epididymis: Cyst or spermatocele measuring about 9 mm diameter. Hydrocele:  Minimal bilateral hydroceles. Varicocele:  None visualized. Pulsed Doppler interrogation of both testes demonstrates normal low resistance arterial and venous waveforms bilaterally. Normal homogeneous flow demonstrated in both testes and epididymides on color flow Doppler imaging. IMPRESSION: No evidence of testicular mass, inflammation, or torsion. Electronically Signed   By: Lucienne Capers M.D.   On: 02/07/2015 22:21    Scheduled Meds: . amLODipine  10 mg Oral Daily  . aspirin  EC  162 mg Oral Daily  . carvedilol  25 mg Oral BID WC  . efavirenz-emtricitabine-tenofovir  1 tablet Oral QHS  . enoxaparin (LOVENOX) injection  40 mg Subcutaneous Q24H  . famotidine  40 mg Oral Daily  . ibuprofen  600 mg Oral TID  . lisinopril  20 mg Oral Daily  . pantoprazole  40 mg Oral Daily  . PARoxetine  40 mg Oral Daily  . rosuvastatin  20 mg Oral Daily  . sodium chloride  3 mL Intravenous Q12H  . traZODone  50 mg Oral QHS   Continuous Infusions:   Principal Problem:   Chest pain Active Problems:   Major depressive disorder, recurrent, severe  without psychotic features (H[facility])   HIV (human immunodeficiency virus infection) ([place])   CAD (coronary artery disease)   Suicidal ideations   Hypertension   Anxiety   Testicular pain, right    Time spent: 28 minutes    Kaycee Mcgaugh M.D. [facility] Pager [phone]. If 7PM-7AM, please contact night-coverage at [email or web address], password [facility] 02/09/2015, 12:11 PM

## 2015-02-09 NOTE — Progress Notes (Signed)
Received stress test records from [facility] cardiology.  On front of chart.

## 2015-02-10 DIAGNOSIS — R0789 Other chest pain: Secondary | ICD-10-CM

## 2015-02-10 DIAGNOSIS — R079 Chest pain, unspecified: Secondary | ICD-10-CM | POA: Diagnosis not present

## 2015-02-10 DIAGNOSIS — F419 Anxiety disorder, unspecified: Secondary | ICD-10-CM | POA: Diagnosis not present

## 2015-02-10 LAB — BASIC METABOLIC PANEL
Anion gap: 5 (ref 5–15)
BUN: 17 mg/dL (ref 6–20)
CHLORIDE: 105 mmol/L (ref 101–111)
CO2: 31 mmol/L (ref 22–32)
Calcium: 9.1 mg/dL (ref 8.9–10.3)
Creatinine, Ser: 1.06 mg/dL (ref 0.61–1.24)
GFR calc Af Amer: >60 mL/min (ref >60)
GFR calc non Af Amer: >60 mL/min (ref >60)
GLUCOSE: 98 mg/dL (ref 65–99)
POTASSIUM: 4.1 mmol/L (ref 3.5–5.1)
Sodium: 141 mmol/L (ref 135–145)

## 2015-02-10 MED ORDER — HYDROCODONE-ACETAMINOPHEN 5-325 MG PO TABS: 1.0000 | ORAL_TABLET | ORAL | Status: DC | PRN

## 2015-02-10 MED ORDER — IBUPROFEN 600 MG PO TABS: 600.0000 mg | ORAL_TABLET | ORAL | Status: DC | PRN

## 2015-02-10 MED ORDER — TRAZODONE HCL 50 MG PO TABS: 50.0000 mg | ORAL_TABLET | Freq: Every day | ORAL | Status: DC

## 2015-02-10 NOTE — Discharge Summary (Signed)
Physician Discharge Summary  Nathan Santana [id number] DOB: 1961-01-08 DOA: 02/05/2015  PCP: PROVIDER NOT IN SYSTEM  Admit date: 02/05/2015 Discharge date: 02/10/2015  Recommendations for Outpatient Follow-up:  1. Pt will need to follow up with PCP in 2-3 weeks post discharge 2. Please obtain BMP to evaluate electrolytes and kidney function 3. Please also check CBC to evaluate Hg and Hct levels  Discharge Diagnoses:  Principal Problem:   Chest pain Active Problems:   Major depressive disorder, recurrent, severe without psychotic features ([place])   HIV (human immunodeficiency virus infection) ([place])   CAD (coronary artery disease)   Suicidal ideations   Hypertension   Anxiety   Testicular pain, right   Orchalgia  Discharge Condition: Stable  Diet recommendation: Heart healthy diet discussed in details   History of present illness:  Pt is 54 yo male who presented with several days duration of intermittent cheat pain, fluttering, occasionally radiating to neck and bilateral shoulders.   Hospital Course:  #1 chest pain - Cardiac enzymes negative 3. Chest x-ray negative.  - EKG with right bundle branch block. 2-D echo with EF of 50-55% with no wall motion abnormalities. - Continue aspirin, Coreg, lisinopril, Crestor. - cardiology cleared for discharge and outpatient follow up  #2 probable hyperlipidemia - Lipid panel with total cholesterol of 160, LDL of 95, HDL of 37.  - Continue home dose Crestor.  #3 history of HIV - CD4 count at 450. Continue atripla.  #4 history of coronary artery disease - See problem #1.  #5 major depressive disorder/suicidal ideation - Will need outpatient follow-up. Psychiatry ff.  #6 hypertension - Stable. Continue lisinopril, Coreg.  #7 Scrotal/Testicular pain/ Chronic orchalgia - [place] of scrotum unremarkable. - seen by urology and feel patient has chronic orchalgia likely secondary to recent right testicular cyst removal in June 2016.     Code Status: Full Family Communication: Updated patient. No family present. Disposition Plan: home    Consultants:  Psychiatry: Dr.Akintayo 02/06/2015  Cardiology: Dr Stanford Breed 02/07/2015  Procedures:  2-D echo 02/06/2015  Chest x-ray 02/05/2015  US scrotum 02/07/2015  Antibiotics:  None  Procedures/Studies: Dg Chest 2 View  02/05/2015  CLINICAL DATA:  Patient with chest pain.  History of anxiety. EXAM: CHEST  2 VIEW COMPARISON:  Chest radiograph 02/02/2015. FINDINGS: Normal cardiac and mediastinal contours. No consolidative pulmonary opacities. No pleural effusion or pneumothorax. Regional skeleton is unremarkable. IMPRESSION: No active cardiopulmonary disease. Electronically Signed   By: Lovey Newcomer M.D.   On: 02/05/2015 19:50   Dg Chest 2 View  02/02/2015  CLINICAL DATA:  Left chest pain for 1 week. Hypertension. History of HIV infection. EXAM: CHEST  2 VIEW COMPARISON:  None. FINDINGS: The heart size and mediastinal contours are within normal limits. Both lungs are clear. The visualized skeletal structures are unremarkable. IMPRESSION: No active cardiopulmonary disease. Electronically Signed   By: Van Clines M.D.   On: 02/02/2015 14:41   US Scrotum  02/07/2015  CLINICAL DATA:  Right-sided testicular pain for 3 months. History of HIV and hypertension. EXAM: SCROTAL ULTRASOUND DOPPLER ULTRASOUND OF THE TESTICLES TECHNIQUE: Complete ultrasound examination of the testicles, epididymis, and other scrotal structures was performed. Color and spectral Doppler ultrasound were also utilized to evaluate blood flow to the testicles. COMPARISON:  None. FINDINGS: Right testicle Measurements: 4.4 x 2.3 x 2.7 cm. No mass or microlithiasis visualized. Single echogenic focus demonstrated measuring 2 mm, likely focal calcification. Left testicle Measurements: 4.3 x 2.2 x 3.3 cm. No mass or microlithiasis visualized.  Right epididymis: Cyst or spermatocele measuring about 1.2 cm maximal  diameter. Left epididymis: Cyst or spermatocele measuring about 9 mm diameter. Hydrocele:  Minimal bilateral hydroceles. Varicocele:  None visualized. Pulsed Doppler interrogation of both testes demonstrates normal low resistance arterial and venous waveforms bilaterally. Normal homogeneous flow demonstrated in both testes and epididymides on color flow Doppler imaging. IMPRESSION: No evidence of testicular mass, inflammation, or torsion. Electronically Signed   By: Lucienne Capers M.D.   On: 02/07/2015 22:21   [place] Art/ven Flow Abd Pelv Doppler  02/07/2015  CLINICAL DATA:  Right-sided testicular pain for 3 months. History of HIV and hypertension. EXAM: SCROTAL ULTRASOUND DOPPLER ULTRASOUND OF THE TESTICLES TECHNIQUE: Complete ultrasound examination of the testicles, epididymis, and other scrotal structures was performed. Color and spectral Doppler ultrasound were also utilized to evaluate blood flow to the testicles. COMPARISON:  None. FINDINGS: Right testicle Measurements: 4.4 x 2.3 x 2.7 cm. No mass or microlithiasis visualized. Single echogenic focus demonstrated measuring 2 mm, likely focal calcification. Left testicle Measurements: 4.3 x 2.2 x 3.3 cm. No mass or microlithiasis visualized. Right epididymis: Cyst or spermatocele measuring about 1.2 cm maximal diameter. Left epididymis: Cyst or spermatocele measuring about 9 mm diameter. Hydrocele:  Minimal bilateral hydroceles. Varicocele:  None visualized. Pulsed Doppler interrogation of both testes demonstrates normal low resistance arterial and venous waveforms bilaterally. Normal homogeneous flow demonstrated in both testes and epididymides on color flow Doppler imaging. IMPRESSION: No evidence of testicular mass, inflammation, or torsion. Electronically Signed   By: Lucienne Capers M.D.   On: 02/07/2015 22:21    Discharge Exam: Filed Vitals:   02/10/15 0446 02/10/15 0948  BP: 141/91   Pulse: 60 67  Temp: 98.3 F (36.8 C)   Resp: 18    Filed  Vitals:   02/09/15 1500 02/09/15 2225 02/10/15 0446 02/10/15 0948  BP: 140/73 152/82 141/91   Pulse: 66 65 60 67  Temp: 97.5 F (36.4 C) 98.4 F (36.9 C) 98.3 F (36.8 C)   TempSrc: Oral Oral Oral   Resp: 18 18 18    Height:      Weight:      SpO2: 99% 100% 100%     General: Pt is alert, follows commands appropriately, not in acute distress Cardiovascular: Regular rate and rhythm, S1/S2 +, no murmurs, no rubs, no gallops Respiratory: Clear to auscultation bilaterally, no wheezing, no crackles, no rhonchi Abdominal: Soft, non tender, non distended, bowel sounds +, no guarding  Discharge Instructions  Discharge Instructions    Diet - low sodium heart healthy    Complete by:  As directed      Increase activity slowly    Complete by:  As directed             Medication List    STOP taking these medications        zolpidem 5 MG tablet  Commonly known as:  AMBIEN      TAKE these medications        amLODipine 10 MG tablet  Commonly known as:  NORVASC  Take 1 tablet (10 mg total) by mouth daily.     aspirin 162 MG EC tablet  Take 1 tablet (162 mg total) by mouth daily.     ATRIPLA 600-200-300 MG tablet  Generic drug:  efavirenz-emtricitabine-tenofovir  Take 1 tab daily at bedtime     carvedilol 25 MG tablet  Commonly known as:  COREG  Take 1 tablet (25 mg total) by mouth 2 (two) times daily  with a meal.     fluticasone 50 MCG/ACT nasal spray  Commonly known as:  FLONASE  Place 1 spray into both nostrils 2 (two) times daily.     HYDROcodone-acetaminophen 5-325 MG tablet  Commonly known as:  NORCO/VICODIN  Take 1-2 tablets by mouth every 4 (four) hours as needed for moderate pain.     ibuprofen 600 MG tablet  Commonly known as:  ADVIL,MOTRIN  Take 1 tablet (600 mg total) by mouth every 4 (four) hours as needed.     lisinopril 10 MG tablet  Commonly known as:  PRINIVIL,ZESTRIL  Take 1 tablet (10 mg total) by mouth daily.     nicotine 21 mg/24hr patch   Commonly known as:  NICODERM CQ - dosed in mg/24 hours  Place 1 patch (21 mg total) onto the skin daily.     NITROSTAT 0.4 MG SL tablet  Generic drug:  nitroGLYCERIN  Place 1 tablet (0.4 mg total) under the tongue every 5 (five) minutes as needed for chest pain.     pantoprazole 40 MG tablet  Commonly known as:  PROTONIX  Take 1 tablet (40 mg total) by mouth daily.     PARoxetine 20 MG tablet  Commonly known as:  PAXIL  Take 1 tablet (20 mg total) by mouth daily.     rosuvastatin 20 MG tablet  Commonly known as:  CRESTOR  Take 1 tablet (20 mg total) by mouth daily.     traZODone 50 MG tablet  Commonly known as:  DESYREL  Take 1 tablet (50 mg total) by mouth at bedtime.           Follow-up Information    Follow up with Nickie Retort, MD.   Specialty:  Urology   Why:  Urology for testicular pain   Contact information:   [place] [address] [phone]        The results of significant diagnostics from this hospitalization (including imaging, microbiology, ancillary and laboratory) are listed below for reference.     Microbiology: No results found for this or any previous visit (from the past 240 hour(s)).   Labs: Basic Metabolic Panel:  Recent Labs Lab 02/06/15 0512 02/07/15 0618 02/08/15 0554 02/09/15 0529 02/10/15 0443  NA 141 141 139 139 141  K 4.0 4.1 3.8 4.0 4.1  CL 107 107 106 105 105  CO2 28 28 26 27 31   GLUCOSE 96 95 94 94 98  BUN 21* 14 17 19 17   CREATININE 0.86 0.98 0.93 0.79 1.06  CALCIUM 9.2 8.8* 8.9 9.0 9.1  MG 2.2  --   --   --   --   PHOS 3.6  --   --   --   --    Liver Function Tests:  Recent Labs Lab 02/06/15 0512  AST 35  ALT 63  ALKPHOS 109  BILITOT 0.5  PROT 6.4*  ALBUMIN 3.4*   CBC:  Recent Labs Lab 02/05/15 1923 02/06/15 0512 02/07/15 0618 02/08/15 0554  WBC 7.3 6.3 5.5 5.8  HGB 13.2 11.8* 12.1* 12.5*  HCT 40.1 35.6* 36.9* 37.8*  MCV 100.8* 99.7 100.3* 100.0  PLT 187 157 177 183    Cardiac Enzymes:  Recent Labs Lab 02/05/15 1923 02/05/15 2238 02/06/15 0512 02/06/15 1215 02/06/15 1815  TROPONINI <0.03 <0.03 <0.03 <0.03 <0.03    SIGNED: Time coordinating discharge: 30 minutes  Faye Ramsay, MD  [facility] 02/10/2015, 10:37 AM Pager [phone]  If 7PM-7AM, please contact night-coverage  [email or web address] Password [email or web address]

## 2015-02-10 NOTE — Discharge Instructions (Signed)

## 2015-02-10 NOTE — Care Management Note (Signed)
Case Management Note  Patient Details  Name: [name] MRN: [id number] Date of Birth: 02-May-1960  Subjective/Objective:                    Action/Plan: appointment at [facility] 12/12 at 2:30 PM   Expected Discharge Date:                  Expected Discharge Plan:  Home/Self Care  In-House Referral:     Discharge planning Services  CM Consult, [facility], Follow-up appt scheduled  Post Acute Care Choice:    Choice offered to:     DME Arranged:    DME Agency:     HH Arranged:    [place] Agency:     Status of Service:  In process, will continue to follow  Medicare Important Message Given:    Date Medicare IM Given:    Medicare IM give by:    Date Additional Medicare IM Given:    Additional Medicare Important Message give by:     If discussed at [place] of [place] Meetings, dates discussed:    Additional CommentsPurcell Mouton, RN 02/10/2015, 1:45 PM

## 2015-02-19 ENCOUNTER — Encounter (HOSPITAL_COMMUNITY): Payer: Self-pay | Admitting: Emergency Medicine

## 2015-02-19 ENCOUNTER — Emergency Department (HOSPITAL_COMMUNITY)
Admission: EM | Admit: 2015-02-19 | Discharge: 2015-02-19 | Disposition: A | Discharge status: Home / Self Care | Payer: Medicare Other | Attending: Emergency Medicine | Admitting: Emergency Medicine

## 2015-02-19 DIAGNOSIS — Z79899 Other long term (current) drug therapy: Secondary | ICD-10-CM | POA: Insufficient documentation

## 2015-02-19 DIAGNOSIS — F329 Major depressive disorder, single episode, unspecified: Secondary | ICD-10-CM | POA: Insufficient documentation

## 2015-02-19 DIAGNOSIS — Z7982 Long term (current) use of aspirin: Secondary | ICD-10-CM | POA: Diagnosis not present

## 2015-02-19 DIAGNOSIS — I209 Angina pectoris, unspecified: Secondary | ICD-10-CM | POA: Diagnosis not present

## 2015-02-19 DIAGNOSIS — F17210 Nicotine dependence, cigarettes, uncomplicated: Secondary | ICD-10-CM | POA: Diagnosis not present

## 2015-02-19 DIAGNOSIS — F419 Anxiety disorder, unspecified: Secondary | ICD-10-CM | POA: Insufficient documentation

## 2015-02-19 DIAGNOSIS — N50811 Right testicular pain: Secondary | ICD-10-CM | POA: Diagnosis not present

## 2015-02-19 DIAGNOSIS — Z9889 Other specified postprocedural states: Secondary | ICD-10-CM | POA: Diagnosis not present

## 2015-02-19 DIAGNOSIS — Z7951 Long term (current) use of inhaled steroids: Secondary | ICD-10-CM | POA: Insufficient documentation

## 2015-02-19 DIAGNOSIS — Z859 Personal history of malignant neoplasm, unspecified: Secondary | ICD-10-CM | POA: Insufficient documentation

## 2015-02-19 DIAGNOSIS — Z8547 Personal history of malignant neoplasm of testis: Secondary | ICD-10-CM | POA: Insufficient documentation

## 2015-02-19 DIAGNOSIS — B20 Human immunodeficiency virus [HIV] disease: Secondary | ICD-10-CM | POA: Insufficient documentation

## 2015-02-19 DIAGNOSIS — Z791 Long term (current) use of non-steroidal anti-inflammatories (NSAID): Secondary | ICD-10-CM | POA: Insufficient documentation

## 2015-02-19 DIAGNOSIS — Z872 Personal history of diseases of the skin and subcutaneous tissue: Secondary | ICD-10-CM | POA: Insufficient documentation

## 2015-02-19 DIAGNOSIS — I10 Essential (primary) hypertension: Secondary | ICD-10-CM | POA: Diagnosis not present

## 2015-02-19 MED ORDER — NAPROXEN 500 MG PO TABS: 500.0000 mg | ORAL_TABLET | Freq: Two times a day (BID) | ORAL | Status: DC

## 2015-02-19 MED ORDER — KETOROLAC TROMETHAMINE 60 MG/2ML IM SOLN
60.0000 mg | Freq: Once | INTRAMUSCULAR | Status: DC
  Filled 2015-02-19: qty 2

## 2015-02-19 NOTE — ED Notes (Signed)
Pt reports he has been treated for a cyst on his testicle. Had surgery in June and was recently admitted for same. Pt ran out pain medication prior follow up with urology on 12/12. Pain radiates to R groin/ RLQ.

## 2015-02-19 NOTE — ED Provider Notes (Signed)
CSN: [id number]     Arrival date & time 02/19/15  1704 History   First MD Initiated Contact with Patient 02/19/15 1728     Chief Complaint  Patient presents with  . Testicle Pain     (Consider location/radiation/quality/duration/timing/severity/associated sxs/prior Treatment) HPI Comments: Patient with history of chronic testicular pain.  Reports having a tumor removed from right scrotum in June in [place]. Evaluated for testicular pain during admission for chest pain mid November.  Patient states he has an appointment with urology on 03/01/15.  He is out of pain medication.  Patient is a 54 y.o. male presenting with testicular pain. The history is provided by the patient and medical records.  Testicle Pain This is a recurrent problem. The current episode started in the past 7 days. The problem occurs 2 to 4 times per day. The problem has been gradually worsening. Pertinent negatives include no abdominal pain or fever. The symptoms are aggravated by walking.    Past Medical History  Diagnosis Date  . HIV infection ([place])   . Cancer ([place])   . Hypertension   . Angina pectoris ([place])   . Scrotal cyst   . Anxiety   . Depression    Past Surgical History  Procedure Laterality Date  . Scrotal turmor removed     Family History  Problem Relation Age of Onset  . Hypertension Other   . CAD Sister    Social History  Substance Use Topics  . Smoking status: Current Some Day Smoker -- 0.50 packs/day    Types: Cigars  . Smokeless tobacco: None  . Alcohol Use: 0.6 oz/week    1 Cans of beer per week     Comment: one beer weekly    Review of Systems  Constitutional: Negative for fever.  Gastrointestinal: Negative for abdominal pain.  Genitourinary: Positive for testicular pain.  All other systems reviewed and are negative.     Allergies  Review of patient's allergies indicates no known allergies.  Home Medications   Prior to Admission medications   Medication Sig Start Date  End Date Taking? Authorizing Provider  amLODipine (NORVASC) 10 MG tablet Take 1 tablet (10 mg total) by mouth daily. 02/05/15  Yes Benjamine Mola, FNP  aspirin EC 162 MG EC tablet Take 1 tablet (162 mg total) by mouth daily. 02/05/15  Yes Benjamine Mola, FNP  ATRIPLA 600-200-300 MG tablet Take 1 tab daily at bedtime 02/05/15  Yes Benjamine Mola, FNP  carvedilol (COREG) 25 MG tablet Take 1 tablet (25 mg total) by mouth 2 (two) times daily with a meal. 02/05/15  Yes Benjamine Mola, FNP  fluticasone (FLONASE) 50 MCG/ACT nasal spray Place 1 spray into both nostrils 2 (two) times daily. 02/05/15  Yes Benjamine Mola, FNP  ibuprofen (ADVIL,MOTRIN) 600 MG tablet Take 1 tablet (600 mg total) by mouth every 4 (four) hours as needed. Patient taking differently: Take 600 mg by mouth daily.  02/10/15  Yes Theodis Blaze, MD  lisinopril (PRINIVIL,ZESTRIL) 10 MG tablet Take 1 tablet (10 mg total) by mouth daily. 02/05/15  Yes John Johnn Hai, FNP  NITROSTAT 0.4 MG SL tablet Place 1 tablet (0.4 mg total) under the tongue every 5 (five) minutes as needed for chest pain. 02/05/15  Yes Benjamine Mola, FNP  pantoprazole (PROTONIX) 40 MG tablet Take 1 tablet (40 mg total) by mouth daily. 02/05/15  Yes Benjamine Mola, FNP  PARoxetine (PAXIL) 20 MG tablet Take 1 tablet (20 mg total)  by mouth daily. 02/05/15  Yes Benjamine Mola, FNP  rosuvastatin (CRESTOR) 20 MG tablet Take 1 tablet (20 mg total) by mouth daily. 02/05/15  Yes Benjamine Mola, FNP  traZODone (DESYREL) 50 MG tablet Take 1 tablet (50 mg total) by mouth at bedtime. 02/10/15  Yes Theodis Blaze, MD  HYDROcodone-acetaminophen (NORCO/VICODIN) 5-325 MG tablet Take 1-2 tablets by mouth every 4 (four) hours as needed for moderate pain. Patient not taking: Reported on 02/19/2015 02/10/15   Theodis Blaze, MD  nicotine (NICODERM CQ - DOSED IN MG/24 HOURS) 21 mg/24hr patch Place 1 patch (21 mg total) onto the skin daily. Patient not taking: Reported on 02/19/2015 02/05/15    Elyse Jarvis Withrow, FNP   BP 132/88 mmHg  Pulse 84  Temp(Src) 98.2 F (36.8 C) (Oral)  Resp 20  SpO2 100% Physical Exam  Constitutional: He is oriented to person, place, and time. He appears well-developed and well-nourished.  HENT:  Head: Normocephalic.  Eyes: Conjunctivae are normal.  Neck: Neck supple.  Cardiovascular: Normal rate and regular rhythm.   Pulmonary/Chest: Effort normal and breath sounds normal.  Abdominal: Soft. Bowel sounds are normal. There is no tenderness.  Genitourinary: Cremasteric reflex is present. Right testis shows tenderness. Left testis shows tenderness.  epididymal tenderness R>L  Musculoskeletal: Normal range of motion.  Neurological: He is alert and oriented to person, place, and time.  Skin: Skin is warm and dry.  Psychiatric: He has a normal mood and affect.  Nursing note and vitals reviewed.   ED Course  Procedures (including critical care time) Labs Review Labs Reviewed - No data to display  Imaging Review No results found. I have personally reviewed and evaluated these images and lab results as part of my medical decision-making.   EKG Interpretation None     Patient had ultrasound completed on 02/07/15: SCROTAL ULTRASOUND  DOPPLER ULTRASOUND OF THE TESTICLES  TECHNIQUE: Complete ultrasound examination of the testicles, epididymis, and other scrotal structures was performed. Color and spectral Doppler ultrasound were also utilized to evaluate blood flow to the testicles.  COMPARISON: None.  FINDINGS: Right testicle  Measurements: 4.4 x 2.3 x 2.7 cm. No mass or microlithiasis visualized. Single echogenic focus demonstrated measuring 2 mm, likely focal calcification.  Left testicle  Measurements: 4.3 x 2.2 x 3.3 cm. No mass or microlithiasis visualized.  Right epididymis: Cyst or spermatocele measuring about 1.2 cm maximal diameter.  Left epididymis: Cyst or spermatocele measuring about 9 mm  diameter.  Hydrocele: Minimal bilateral hydroceles.  Varicocele: None visualized.  Pulsed Doppler interrogation of both testes demonstrates normal low resistance arterial and venous waveforms bilaterally.  Normal homogeneous flow demonstrated in both testes and epididymides on color flow Doppler imaging.  IMPRESSION: No evidence of testicular mass, inflammation, or torsion.   Electronically Signed  By: Lucienne Capers M.D.  On: 02/07/2015 22:21  Patient discussed with Dr. Eulis Foster. Testicular tenderness/pain, localized to epididymis, chronic in nature. Recent evaluation via ultrasound, results as noted. Cremasteric reflex intact. No increased warmth/edema.  MDM   Final diagnoses:  None   Scrotal mass/chronic testicular pain. Has urology follow-up on December 12. Follow-up with PCP in interim. Anti-inflammatory. Return precautions discussed.       Etta Quill, NP 02/19/15 [id number]  Etta Quill, NP 02/19/15 [id number]  Daleen Bo, MD 02/20/15 [id number]

## 2015-03-01 ENCOUNTER — Encounter: Payer: Self-pay | Admitting: Family Medicine

## 2015-03-01 ENCOUNTER — Ambulatory Visit (INDEPENDENT_AMBULATORY_CARE_PROVIDER_SITE_OTHER): Payer: Medicare Other | Admitting: Family Medicine

## 2015-03-01 VITALS — BP 153/96 | HR 64 | Temp 98.2°F | Resp 16 | Ht 71.5 in | Wt 199.0 lb

## 2015-03-01 DIAGNOSIS — I10 Essential (primary) hypertension: Secondary | ICD-10-CM

## 2015-03-01 DIAGNOSIS — F32A Depression, unspecified: Secondary | ICD-10-CM

## 2015-03-01 DIAGNOSIS — F329 Major depressive disorder, single episode, unspecified: Secondary | ICD-10-CM

## 2015-03-01 DIAGNOSIS — Z21 Asymptomatic human immunodeficiency virus [HIV] infection status: Secondary | ICD-10-CM | POA: Diagnosis not present

## 2015-03-01 DIAGNOSIS — E785 Hyperlipidemia, unspecified: Secondary | ICD-10-CM

## 2015-03-01 DIAGNOSIS — N50811 Right testicular pain: Secondary | ICD-10-CM

## 2015-03-01 DIAGNOSIS — B20 Human immunodeficiency virus [HIV] disease: Secondary | ICD-10-CM

## 2015-03-01 LAB — LIPID PANEL
Cholesterol: 156 mg/dL (ref 125–200)
HDL: 51 mg/dL (ref >=40)
LDL Cholesterol: 93 mg/dL (ref <130)
Total CHOL/HDL Ratio: 3.1 Ratio (ref <=5.0)
Triglycerides: 60 mg/dL (ref <150)
VLDL: 12 mg/dL (ref <30)

## 2015-03-01 LAB — COMPLETE METABOLIC PANEL WITH GFR
ALBUMIN: 4 g/dL (ref 3.6–5.1)
ALK PHOS: 133 U/L — AB (ref 40–115)
ALT: 18 U/L (ref 9–46)
AST: 17 U/L (ref 10–35)
BILIRUBIN TOTAL: 0.4 mg/dL (ref 0.2–1.2)
BUN: 15 mg/dL (ref 7–25)
CO2: 28 mmol/L (ref 20–31)
Calcium: 9.3 mg/dL (ref 8.6–10.3)
Chloride: 105 mmol/L (ref 98–110)
Creat: 0.75 mg/dL (ref 0.70–1.33)
GFR, Est African American: >89 mL/min (ref >=60)
GFR, Est Non African American: >89 mL/min (ref >=60)
GLUCOSE: 77 mg/dL (ref 65–99)
Potassium: 4 mmol/L (ref 3.5–5.3)
SODIUM: 140 mmol/L (ref 135–146)
TOTAL PROTEIN: 7.3 g/dL (ref 6.1–8.1)

## 2015-03-01 LAB — POCT URINALYSIS DIP (DEVICE)
Bilirubin Urine: NEGATIVE
Glucose, UA: NEGATIVE mg/dL
HGB URINE DIPSTICK: NEGATIVE
KETONES UR: NEGATIVE mg/dL
LEUKOCYTES UA: NEGATIVE
Nitrite: NEGATIVE
Protein, ur: NEGATIVE mg/dL
SPECIFIC GRAVITY, URINE: 1.025 (ref 1.005–1.030)
UROBILINOGEN UA: 0.2 mg/dL (ref 0.0–1.0)
pH: 7 (ref 5.0–8.0)

## 2015-03-01 MED ORDER — AMLODIPINE BESYLATE 10 MG PO TABS: 10.0000 mg | ORAL_TABLET | Freq: Every day | ORAL | Status: DC

## 2015-03-01 MED ORDER — CARVEDILOL 25 MG PO TABS: 25.0000 mg | ORAL_TABLET | Freq: Two times a day (BID) | ORAL | Status: DC

## 2015-03-01 MED ORDER — ASPIRIN EC 81 MG PO TBEC: 81.0000 mg | DELAYED_RELEASE_TABLET | Freq: Every day | ORAL | Status: DC

## 2015-03-01 MED ORDER — LISINOPRIL 10 MG PO TABS: 10.0000 mg | ORAL_TABLET | Freq: Every day | ORAL | Status: DC

## 2015-03-01 MED ORDER — ROSUVASTATIN CALCIUM 20 MG PO TABS: 20.0000 mg | ORAL_TABLET | Freq: Every day | ORAL | Status: DC

## 2015-03-01 NOTE — Progress Notes (Signed)
Subjective:    Patient ID: Nathan Santana, male    DOB: Apr 11, 1960, 54 y.o.   MRN: [id number]  HPI Mr. Nathan Santana, a 54 year old male with a history of HIV and hypertension presents to establish care. He was previously a patient of Dr. Jessie Foot at [facility]. He recently relocated to area. He was followed by infectious disease in [place], [place]. He was diagnosed with HIV in 2004. He states that he contracted HIV through sexual contact. His previous girlfriend was and IV drug user. 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 He has not used cocaine in several years.   Patient has a history of hypertension. He has not been taking medications consistently.  He is not exercising and is not adherent to low salt diet.  He is currently not checking blood pressure at home.   Patient denies dizziness, chest pain, dyspnea, lower extremity edema, orthopnea, palpitations and tachypnea.  Cardiovascular risk factors include: dyslipidemia, hypertension, obesity (BMI >= 30 kg/m2), sedentary lifestyle and smoking/ tobacco exposure.    Patient was recently evaluated in the hospital for an exacerbation of depression. Patient reports that he had situational depression due to the fact that he was homeless and had a recent breakup with longtime girlfriend. Patient states that depression has been controlled on Paxil.  He denies current suicidal and homicidal plan or intent.   Family history insignificant for depression or anxiety.   Past Medical History  Diagnosis Date  . HIV infection ([place])   . Cancer ([place])   . Hypertension   . Angina pectoris ([place])   . Scrotal cyst   . Anxiety   . Depression    Social History   Social History  . Marital Status: Single    Spouse Name: N/A  . Number of Children: 4  . Years of Education: N/A   Occupational History  . Not on file.   Social History Main Topics  . Smoking status: Current Some Day Smoker -- 0.50 packs/day    Types:  Cigars  . Smokeless tobacco: Not on file  . Alcohol Use: No     Comment: one beer weekly  . Drug Use: No     Comment: Cocaine  . Sexual Activity: No   Other Topics Concern  . Not on file   Social History Narrative     There is no immunization history on file for this patient. Flu and pneumonia in the hospital  Review of Systems  Constitutional: Negative.  Negative for fever, fatigue and unexpected weight change.  HENT: Negative.   Eyes: Negative for photophobia and visual disturbance.  Respiratory: Negative.   Cardiovascular: Negative for chest pain, palpitations and leg swelling.  Gastrointestinal: Negative.  Negative for constipation.  Endocrine: Negative.  Negative for polydipsia, polyphagia and polyuria.  Genitourinary: Positive for scrotal swelling and testicular pain.  Musculoskeletal: Negative.   Skin: Negative.   Allergic/Immunologic: Negative.   Neurological: Negative.  Negative for tremors and weakness.  Hematological: Negative.   Psychiatric/Behavioral: Negative.  Negative for suicidal ideas and sleep disturbance.       Objective:   Physical Exam  Constitutional: He is oriented to person, place, and time. He appears well-developed and well-nourished.  HENT:  Head: Normocephalic and atraumatic.  Right Ear: External ear normal.  Left Ear: External ear normal.  Mouth/Throat: Oropharynx is clear and moist.  Eyes: Conjunctivae and EOM are normal. Pupils are equal, round, and reactive to light.  Neck: Normal range  of motion. Neck supple.  Cardiovascular: Normal rate, regular rhythm, normal heart sounds and intact distal pulses.   Pulmonary/Chest: Effort normal and breath sounds normal.  Abdominal: Soft. Bowel sounds are normal.  Musculoskeletal: Normal range of motion.  Neurological: He is alert and oriented to person, place, and time. He has normal reflexes.  Skin: Skin is warm and dry.  Psychiatric: He has a normal mood and affect. His behavior is normal.  Judgment and thought content normal.      BP 153/96 mmHg  Pulse 64  Temp(Src) 98.2 F (36.8 C) (Oral)  Resp 16  Ht 5' 11.5" (1.816 m)  Wt 199 lb (90.266 kg)  BMI 27.37 kg/m2 Assessment & Plan:   1. Essential hypertension Blood pressure is above goal at present. Will re-start anti-hypertensive medications as previously prescribed. Will follow up in 1 month for hypertension. Reviewed urinalysis, no proteinuria present. The patient is asked to make an attempt to improve diet and exercise patterns to aid in medical management of this problem. - lisinopril (PRINIVIL,ZESTRIL) 10 MG tablet; Take 1 tablet (10 mg total) by mouth daily.  Dispense: 30 tablet; Refill: 2 - amLODipine (NORVASC) 10 MG tablet; Take 1 tablet (10 mg total) by mouth daily.  Dispense: 30 tablet; Refill: 2 - carvedilol (COREG) 25 MG tablet; Take 1 tablet (25 mg total) by mouth 2 (two) times daily with a meal.  Dispense: 60 tablet; Refill: 2 - aspirin 81 MG tablet; Take 1 tablet (81 mg total) by mouth daily.  Dispense: 30 tablet; Refill: 2 - POCT urinalysis dipstick - Lipid Panel - COMPLETE METABOLIC PANEL WITH GFR - Microalbumin/Creatinine Ratio, Urine  2. HIV (human immunodeficiency virus infection) ([place]) Patient was previously followed in [place], [place]. Will send a medical records request and referral to [facility] for a continuation of therapy. He states that he has been taking Atripla consistently.  - Ambulatory referral to Infectious Disease  3. Hyperlipidemia LDL goal <130 Reviewed previous lipid panel. Will continue medications as prescribed - rosuvastatin (CRESTOR) 20 MG tablet; Take 1 tablet (20 mg total) by mouth daily.  Dispense: 30 tablet; Refill: 11  4. 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 He maintains that he has an appointment scheduled with urology on 03/01/2015. He states that he was previously a patient of Dr. Sherryll Burger, urologist in [place].   5.  Depression Patient reports a history of depression that is controlled on Paxil 20 mg daily. Reviewed PHQ-9. Patient currently denies suicidal or homicidal intent.    RTC: 1 month for hypertension and depression The patient was given clear instructions to go to ER or return to medical center if symptoms do not improve, worsen or new problems develop. The patient verbalized understanding. Will notify patient with laboratory results.   Dorena Dew, FNP

## 2015-03-01 NOTE — Patient Instructions (Signed)
DASH Eating Plan °DASH stands for "Dietary Approaches to Stop Hypertension." The DASH eating plan is a healthy eating plan that has been shown to reduce high blood pressure (hypertension). Additional health benefits may include reducing the risk of type 2 diabetes mellitus, heart disease, and stroke. The DASH eating plan may also help with weight loss. °WHAT DO I NEED TO KNOW ABOUT THE DASH EATING PLAN? °For the DASH eating plan, you will follow these general guidelines: °· Choose foods with a percent daily value for sodium of less than 5% (as listed on the food label). °· Use salt-free seasonings or herbs instead of table salt or sea salt. °· Check with your health care provider or pharmacist before using salt substitutes. °· Eat lower-sodium products, often labeled as "lower sodium" or "no salt added." °· Eat fresh foods. °· Eat more vegetables, fruits, and low-fat dairy products. °· Choose whole grains. Look for the word "whole" as the first word in the ingredient list. °· Choose fish and skinless chicken or turkey more often than red meat. Limit fish, poultry, and meat to 6 oz (170 g) each day. °· Limit sweets, desserts, sugars, and sugary drinks. °· Choose heart-healthy fats. °· Limit cheese to 1 oz (28 g) per day. °· Eat more home-cooked food and less restaurant, buffet, and fast food. °· Limit fried foods. °· Cook foods using methods other than frying. °· Limit canned vegetables. If you do use them, rinse them well to decrease the sodium. °· When eating at a restaurant, ask that your food be prepared with less salt, or no salt if possible. °WHAT FOODS CAN I EAT? °Seek help from a dietitian for individual calorie needs. °Grains °Whole grain or whole wheat bread. Brown rice. Whole grain or whole wheat pasta. Quinoa, bulgur, and whole grain cereals. Low-sodium cereals. Corn or whole wheat flour tortillas. Whole grain cornbread. Whole grain crackers. Low-sodium crackers. °Vegetables °Fresh or frozen vegetables  (raw, steamed, roasted, or grilled). Low-sodium or reduced-sodium tomato and vegetable juices. Low-sodium or reduced-sodium tomato sauce and paste. Low-sodium or reduced-sodium canned vegetables.  °Fruits °All fresh, canned (in natural juice), or frozen fruits. °Meat and Other Protein Products °Ground beef (85% or leaner), grass-fed beef, or beef trimmed of fat. Skinless chicken or turkey. Ground chicken or turkey. Pork trimmed of fat. All fish and seafood. Eggs. Dried beans, peas, or lentils. Unsalted nuts and seeds. Unsalted canned beans. °Dairy °Low-fat dairy products, such as skim or 1% milk, 2% or reduced-fat cheeses, low-fat ricotta or cottage cheese, or plain low-fat yogurt. Low-sodium or reduced-sodium cheeses. °Fats and Oils °Tub margarines without trans fats. Light or reduced-fat mayonnaise and salad dressings (reduced sodium). Avocado. Safflower, olive, or canola oils. Natural peanut or almond butter. °Other °Unsalted popcorn and pretzels. °The items listed above may not be a complete list of recommended foods or beverages. Contact your dietitian for more options. °WHAT FOODS ARE NOT RECOMMENDED? °Grains °White bread. White pasta. White rice. Refined cornbread. Bagels and croissants. Crackers that contain trans fat. °Vegetables °Creamed or fried vegetables. Vegetables in a cheese sauce. Regular canned vegetables. Regular canned tomato sauce and paste. Regular tomato and vegetable juices. °Fruits °Dried fruits. Canned fruit in light or heavy syrup. Fruit juice. °Meat and Other Protein Products °Fatty cuts of meat. Ribs, chicken wings, bacon, sausage, bologna, salami, chitterlings, fatback, hot dogs, bratwurst, and packaged luncheon meats. Salted nuts and seeds. Canned beans with salt. °Dairy °Whole or 2% milk, cream, half-and-half, and cream cheese. Whole-fat or sweetened yogurt. Full-fat   cheeses or blue cheese. Nondairy creamers and whipped toppings. Processed cheese, cheese spreads, or cheese  curds. °Condiments °Onion and garlic salt, seasoned salt, table salt, and sea salt. Canned and packaged gravies. Worcestershire sauce. Tartar sauce. Barbecue sauce. Teriyaki sauce. Soy sauce, including reduced sodium. Steak sauce. Fish sauce. Oyster sauce. Cocktail sauce. Horseradish. Ketchup and mustard. Meat flavorings and tenderizers. Bouillon cubes. Hot sauce. Tabasco sauce. Marinades. Taco seasonings. Relishes. °Fats and Oils °Butter, stick margarine, lard, shortening, ghee, and bacon fat. Coconut, palm kernel, or palm oils. Regular salad dressings. °Other °Pickles and olives. Salted popcorn and pretzels. °The items listed above may not be a complete list of foods and beverages to avoid. Contact your dietitian for more information. °WHERE CAN I FIND MORE INFORMATION? °National Heart, Lung, and Blood Institute: www.nhlbi.nih.gov/health/health-topics/topics/dash/ °  °This information is not intended to replace advice given to you by your health care provider. Make sure you discuss any questions you have with your health care provider. °  °Document Released: 02/23/2011 Document Revised: 03/27/2014 Document Reviewed: 01/08/2013 °Elsevier Interactive Patient Education ©2016 Elsevier Inc. ° °Hypertension °Hypertension, commonly called high blood pressure, is when the force of blood pumping through your arteries is too strong. Your arteries are the blood vessels that carry blood from your heart throughout your body. A blood pressure reading consists of a higher number over a lower number, such as 110/72. The higher number (systolic) is the pressure inside your arteries when your heart pumps. The lower number (diastolic) is the pressure inside your arteries when your heart relaxes. Ideally you want your blood pressure below 120/80. °Hypertension forces your heart to work harder to pump blood. Your arteries may become narrow or stiff. Having untreated or uncontrolled hypertension can cause heart attack, stroke, kidney  disease, and other problems. °RISK FACTORS °Some risk factors for high blood pressure are controllable. Others are not.  °Risk factors you cannot control include:  °· Race. You may be at higher risk if you are African American. °· Age. Risk increases with age. °· Gender. Men are at higher risk than women before age 45 years. After age 65, women are at higher risk than men. °Risk factors you can control include: °· Not getting enough exercise or physical activity. °· Being overweight. °· Getting too much fat, sugar, calories, or salt in your diet. °· Drinking too much alcohol. °SIGNS AND SYMPTOMS °Hypertension does not usually cause signs or symptoms. Extremely high blood pressure (hypertensive crisis) may cause headache, anxiety, shortness of breath, and nosebleed. °DIAGNOSIS °To check if you have hypertension, your health care provider will measure your blood pressure while you are seated, with your arm held at the level of your heart. It should be measured at least twice using the same arm. Certain conditions can cause a difference in blood pressure between your right and left arms. A blood pressure reading that is higher than normal on one occasion does not mean that you need treatment. If it is not clear whether you have high blood pressure, you may be asked to return on a different day to have your blood pressure checked again. Or, you may be asked to monitor your blood pressure at home for 1 or more weeks. °TREATMENT °Treating high blood pressure includes making lifestyle changes and possibly taking medicine. Living a healthy lifestyle can help lower high blood pressure. You may need to change some of your habits. °Lifestyle changes may include: °· Following the DASH diet. This diet is high in fruits, vegetables, and whole   grains. It is low in salt, red meat, and added sugars. °· Keep your sodium intake below 2,300 mg per day. °· Getting at least 30-45 minutes of aerobic exercise at least 4 times per  week. °· Losing weight if necessary. °· Not smoking. °· Limiting alcoholic beverages. °· Learning ways to reduce stress. °Your health care provider may prescribe medicine if lifestyle changes are not enough to get your blood pressure under control, and if one of the following is true: °· You are 18-59 years of age and your systolic blood pressure is above 140. °· You are 60 years of age or older, and your systolic blood pressure is above 150. °· Your diastolic blood pressure is above 90. °· You have diabetes, and your systolic blood pressure is over 140 or your diastolic blood pressure is over 90. °· You have kidney disease and your blood pressure is above 140/90. °· You have heart disease and your blood pressure is above 140/90. °Your personal target blood pressure may vary depending on your medical conditions, your age, and other factors. °HOME CARE INSTRUCTIONS °· Have your blood pressure rechecked as directed by your health care provider.   °· Take medicines only as directed by your health care provider. Follow the directions carefully. Blood pressure medicines must be taken as prescribed. The medicine does not work as well when you skip doses. Skipping doses also puts you at risk for problems. °· Do not smoke.   °· Monitor your blood pressure at home as directed by your health care provider.  °SEEK MEDICAL CARE IF:  °· You think you are having a reaction to medicines taken. °· You have recurrent headaches or feel dizzy. °· You have swelling in your ankles. °· You have trouble with your vision. °SEEK IMMEDIATE MEDICAL CARE IF: °· You develop a severe headache or confusion. °· You have unusual weakness, numbness, or feel faint. °· You have severe chest or abdominal pain. °· You vomit repeatedly. °· You have trouble breathing. °MAKE SURE YOU:  °· Understand these instructions. °· Will watch your condition. °· Will get help right away if you are not doing well or get worse. °  °This information is not intended to  replace advice given to you by your health care provider. Make sure you discuss any questions you have with your health care provider. °  °Document Released: 03/06/2005 Document Revised: 07/21/2014 Document Reviewed: 12/27/2012 °Elsevier Interactive Patient Education ©2016 Elsevier Inc. ° °

## 2015-03-02 LAB — MICROALBUMIN / CREATININE URINE RATIO
Creatinine, Urine: 183 mg/dL (ref 20–370)
Microalb Creat Ratio: 8 mcg/mg creat (ref <30)
Microalb, Ur: 1.4 mg/dL

## 2015-03-08 ENCOUNTER — Other Ambulatory Visit: Payer: Self-pay

## 2015-04-01 ENCOUNTER — Ambulatory Visit: Payer: Self-pay | Admitting: Family Medicine

## 2015-04-05 ENCOUNTER — Telehealth: Payer: Self-pay

## 2015-04-05 NOTE — Telephone Encounter (Signed)
Referral received from [facility] . I was not able to contact patient. He no longer resides at extended stay. No forwarding phone number or address.   Laverle Patter, RN

## 2015-05-19 ENCOUNTER — Emergency Department (HOSPITAL_COMMUNITY): Payer: Medicare Other

## 2015-05-19 ENCOUNTER — Encounter (HOSPITAL_COMMUNITY): Payer: Self-pay

## 2015-05-19 ENCOUNTER — Emergency Department (HOSPITAL_COMMUNITY)
Admission: EM | Admit: 2015-05-19 | Discharge: 2015-05-19 | Disposition: A | Discharge status: Home / Self Care | Payer: Medicare Other | Attending: Emergency Medicine | Admitting: Emergency Medicine

## 2015-05-19 DIAGNOSIS — Z872 Personal history of diseases of the skin and subcutaneous tissue: Secondary | ICD-10-CM | POA: Insufficient documentation

## 2015-05-19 DIAGNOSIS — R079 Chest pain, unspecified: Secondary | ICD-10-CM | POA: Insufficient documentation

## 2015-05-19 DIAGNOSIS — F419 Anxiety disorder, unspecified: Secondary | ICD-10-CM | POA: Diagnosis not present

## 2015-05-19 DIAGNOSIS — B20 Human immunodeficiency virus [HIV] disease: Secondary | ICD-10-CM | POA: Insufficient documentation

## 2015-05-19 DIAGNOSIS — I209 Angina pectoris, unspecified: Secondary | ICD-10-CM | POA: Diagnosis not present

## 2015-05-19 DIAGNOSIS — Z79899 Other long term (current) drug therapy: Secondary | ICD-10-CM | POA: Insufficient documentation

## 2015-05-19 DIAGNOSIS — Z7951 Long term (current) use of inhaled steroids: Secondary | ICD-10-CM | POA: Diagnosis not present

## 2015-05-19 DIAGNOSIS — R002 Palpitations: Secondary | ICD-10-CM | POA: Diagnosis not present

## 2015-05-19 DIAGNOSIS — F329 Major depressive disorder, single episode, unspecified: Secondary | ICD-10-CM | POA: Insufficient documentation

## 2015-05-19 DIAGNOSIS — I10 Essential (primary) hypertension: Secondary | ICD-10-CM | POA: Diagnosis not present

## 2015-05-19 DIAGNOSIS — F17210 Nicotine dependence, cigarettes, uncomplicated: Secondary | ICD-10-CM | POA: Diagnosis not present

## 2015-05-19 DIAGNOSIS — Z7982 Long term (current) use of aspirin: Secondary | ICD-10-CM | POA: Insufficient documentation

## 2015-05-19 DIAGNOSIS — Z859 Personal history of malignant neoplasm, unspecified: Secondary | ICD-10-CM | POA: Insufficient documentation

## 2015-05-19 LAB — BASIC METABOLIC PANEL
ANION GAP: 11 (ref 5–15)
BUN: 7 mg/dL (ref 6–20)
CHLORIDE: 104 mmol/L (ref 101–111)
CO2: 26 mmol/L (ref 22–32)
Calcium: 9.2 mg/dL (ref 8.9–10.3)
Creatinine, Ser: 1.08 mg/dL (ref 0.61–1.24)
Glucose, Bld: 92 mg/dL (ref 65–99)
POTASSIUM: 3.4 mmol/L — AB (ref 3.5–5.1)
SODIUM: 141 mmol/L (ref 135–145)

## 2015-05-19 LAB — CBC
HEMATOCRIT: 41.1 % (ref 39.0–52.0)
HEMOGLOBIN: 13.3 g/dL (ref 13.0–17.0)
MCH: 31.6 pg (ref 26.0–34.0)
MCHC: 32.4 g/dL (ref 30.0–36.0)
MCV: 97.6 fL (ref 78.0–100.0)
Platelets: 210 10*3/uL (ref 150–400)
RBC: 4.21 MIL/uL — ABNORMAL LOW (ref 4.22–5.81)
RDW: 12.1 % (ref 11.5–15.5)
WBC: 6.6 10*3/uL (ref 4.0–10.5)

## 2015-05-19 LAB — I-STAT TROPONIN, ED: TROPONIN I, POC: 0.03 ng/mL (ref 0.00–0.08)

## 2015-05-19 NOTE — ED Provider Notes (Signed)
CSN: [id number]     Arrival date & time 05/19/15  0029 History   By signing my name below, I, [name], attest that this documentation has been prepared under the direction and in the presence of Veryl Speak, MD.  Electronically Signed: [name], ED Scribe. 05/19/2015. 3:36 AM.   Chief Complaint  Patient presents with  . Chest Pain   The history is provided by the patient. No language interpreter was used.    HPI Comments: Nathan Santana is a 55 y.o. male with a PMHx of HIV infection, cancer, HTN, and anxiety who presents to the Emergency Department complaining of constant, ongoing "skipping heart beats" x 1 week. Pt states "my heart feels funny like a jerk". Pt also reports mild discomfort to his chest. No aggravating or alleviating factors at this time. No OTC/prescribed medications attempted prior to arrival. No recent fever, chills, nausea, vomiting, leg swelling, or abdominal pain. No prior history of stent placement. He denies any prior history of heart disease. [name] states she is not currently followed by a cardiologist in [place]. No known allergies to medications.  PCP: PROVIDER NOT IN SYSTEM    Past Medical History  Diagnosis Date  . HIV infection ([place])   . Cancer ([place])   . Hypertension   . Angina pectoris ([place])   . Scrotal cyst   . Anxiety   . Depression    Past Surgical History  Procedure Laterality Date  . Scrotal turmor removed    . [place]      cancer tumor removed in 2006    Family History  Problem Relation Age of Onset  . Hypertension Other   . CAD Sister    Social History  Substance Use Topics  . Smoking status: Current Some Day Smoker -- 0.50 packs/day    Types: Cigars  . Smokeless tobacco: None  . Alcohol Use: No     Comment: one beer weekly    Review of Systems  Constitutional: Negative for fever and chills.  Respiratory: Negative for cough and shortness of breath.   Cardiovascular: Positive for chest pain and palpitations.  Negative for leg swelling.  Gastrointestinal: Negative for vomiting and abdominal pain.  Musculoskeletal: Negative for back pain.  Neurological: Negative for headaches.  Psychiatric/Behavioral: Negative for confusion.  All other systems reviewed and are negative.     Allergies  Review of patient's allergies indicates no known allergies.  Home Medications   Prior to Admission medications   Medication Sig Start Date End Date Taking? Authorizing Provider  amLODipine (NORVASC) 10 MG tablet Take 1 tablet (10 mg total) by mouth daily. 03/01/15  Yes Dorena Dew, FNP  aspirin 81 MG tablet Take 1 tablet (81 mg total) by mouth daily. 03/01/15  Yes Dorena Dew, FNP  ATRIPLA 600-200-300 MG tablet Take 1 tab daily at bedtime 02/05/15  Yes Benjamine Mola, FNP  carvedilol (COREG) 25 MG tablet Take 1 tablet (25 mg total) by mouth 2 (two) times daily with a meal. 03/01/15  Yes Dorena Dew, FNP  fluticasone (FLONASE) 50 MCG/ACT nasal spray Place 1 spray into both nostrils 2 (two) times daily. 02/05/15  Yes Benjamine Mola, FNP  ibuprofen (ADVIL,MOTRIN) 600 MG tablet Take 1 tablet (600 mg total) by mouth every 4 (four) hours as needed. 02/10/15  Yes Theodis Blaze, MD  lisinopril (PRINIVIL,ZESTRIL) 10 MG tablet Take 1 tablet (10 mg total) by mouth daily. 03/01/15  Yes Dorena Dew, FNP  NITROSTAT 0.4 MG  SL tablet Place 1 tablet (0.4 mg total) under the tongue every 5 (five) minutes as needed for chest pain. 02/05/15  Yes Benjamine Mola, FNP  pantoprazole (PROTONIX) 40 MG tablet Take 1 tablet (40 mg total) by mouth daily. 02/05/15  Yes Benjamine Mola, FNP  PARoxetine (PAXIL) 20 MG tablet Take 1 tablet (20 mg total) by mouth daily. 02/05/15  Yes Benjamine Mola, FNP  rosuvastatin (CRESTOR) 20 MG tablet Take 1 tablet (20 mg total) by mouth daily. 03/01/15  Yes Dorena Dew, FNP  HYDROcodone-acetaminophen (NORCO/VICODIN) 5-325 MG tablet Take 1-2 tablets by mouth every 4 (four) hours as  needed for moderate pain. Patient not taking: Reported on 02/19/2015 02/10/15   Theodis Blaze, MD  nicotine (NICODERM CQ - DOSED IN MG/24 HOURS) 21 mg/24hr patch Place 1 patch (21 mg total) onto the skin daily. Patient not taking: Reported on 02/19/2015 02/05/15   Benjamine Mola, FNP  traZODone (DESYREL) 50 MG tablet Take 1 tablet (50 mg total) by mouth at bedtime. Patient not taking: Reported on 03/01/2015 02/10/15   Theodis Blaze, MD   Triage Vitals: BP 136/95 mmHg  Pulse 80  Temp(Src) 98.3 F (36.8 C) (Oral)  Resp 19  SpO2 96%   Physical Exam  Constitutional: He is oriented to person, place, and time. He appears well-developed and well-nourished.  HENT:  Head: Normocephalic and atraumatic.  Eyes: EOM are normal.  Neck: Normal range of motion.  Cardiovascular: Normal rate, regular rhythm, normal heart sounds and intact distal pulses.   Pulmonary/Chest: Effort normal and breath sounds normal. No respiratory distress.  Abdominal: Soft. He exhibits no distension. There is no tenderness.  Musculoskeletal: Normal range of motion.  Neurological: He is alert and oriented to person, place, and time.  Skin: Skin is warm and dry.  Psychiatric: He has a normal mood and affect. Judgment normal.  Nursing note and vitals reviewed.   ED Course  Procedures (including critical care time)  DIAGNOSTIC STUDIES: Oxygen Saturation is 96% on RA, adequate by my interpretation.    COORDINATION OF CARE: 3:28 AM- Will order CXR, blood work, and EKG. Discussed treatment plan with pt at bedside and pt agreed to plan.     Labs Review Labs Reviewed  BASIC METABOLIC PANEL - Abnormal; Notable for the following:    Potassium 3.4 (*)    All other components within normal limits  CBC - Abnormal; Notable for the following:    RBC 4.21 (*)    All other components within normal limits  I-STAT TROPOININ, ED    Imaging Review Dg Chest 2 View  05/19/2015  CLINICAL DATA:  Acute onset of left-sided chest pain  and headache. Initial encounter. EXAM: CHEST  2 VIEW COMPARISON:  Chest radiograph performed 02/05/2015 FINDINGS: The lungs are well-aerated. Mild peribronchial thickening is noted. There is no evidence of focal opacification, pleural effusion or pneumothorax. The heart is normal in size; the mediastinal contour is within normal limits. No acute osseous abnormalities are seen. IMPRESSION: Mild peribronchial thickening noted.  Lungs otherwise grossly clear. Electronically Signed   By: Garald Balding M.D.   On: 05/19/2015 01:04   I have personally reviewed and evaluated these images and lab results as part of my medical decision-making.  ED ECG REPORT   Date: 05/21/2015  Rate: 99  Rhythm: sinus rhythm  QRS Axis: indeterminate  Intervals: QRS widened  ST/T Wave abnormalities: nonspecific twave abnormality  Conduction Disutrbances:RBBB  Narrative Interpretation:   Old EKG Reviewed:  unchanged  I have personally reviewed the EKG tracing and agree with the computerized printout as noted.   MDM   Final diagnoses:  None    Patient presents here with complaints of palpitations, skipped beats for one week. He describes a mild discomfort not associated with sob, diaphoresis, nausea, or radiation.  No exertional symptoms. Workup unremarkable.  Doubt ACS. Will discharge, to follow up with pcp prn.  I personally performed the services described in this documentation, which was scribed in my presence. The recorded information has been reviewed and is accurate.    Veryl Speak, MD 05/21/15 [phone]

## 2015-05-19 NOTE — Discharge Instructions (Signed)
Call the cardiology clinic to arrange a follow-up appointment. The contact information has been provided in this discharge summary.   Palpitations A palpitation is the feeling that your heartbeat is irregular or is faster than normal. It may feel like your heart is fluttering or skipping a beat. Palpitations are usually not a serious problem. However, in some cases, you may need further medical evaluation. CAUSES  Palpitations can be caused by:  Smoking.  Caffeine or other stimulants, such as diet pills or energy drinks.  Alcohol.  Stress and anxiety.  Strenuous physical activity.  Fatigue.  Certain medicines.  Heart disease, especially if you have a history of irregular heart rhythms (arrhythmias), such as atrial fibrillation, atrial flutter, or supraventricular tachycardia.  An improperly working pacemaker or defibrillator. DIAGNOSIS  To find the cause of your palpitations, your health care provider will take your medical history and perform a physical exam. Your health care provider may also have you take a test called an ambulatory electrocardiogram (ECG). An ECG records your heartbeat patterns over a 24-hour period. You may also have other tests, such as:  Transthoracic echocardiogram (TTE). During echocardiography, sound waves are used to evaluate how blood flows through your heart.  Transesophageal echocardiogram (TEE).  Cardiac monitoring. This allows your health care provider to monitor your heart rate and rhythm in real time.  Holter monitor. This is a portable device that records your heartbeat and can help diagnose heart arrhythmias. It allows your health care provider to track your heart activity for several days, if needed.  Stress tests by exercise or by giving medicine that makes the heart beat faster. TREATMENT  Treatment of palpitations depends on the cause of your symptoms and can vary greatly. Most cases of palpitations do not require any treatment other than  time, relaxation, and monitoring your symptoms. Other causes, such as atrial fibrillation, atrial flutter, or supraventricular tachycardia, usually require further treatment. HOME CARE INSTRUCTIONS   Avoid:  Caffeinated coffee, tea, soft drinks, diet pills, and energy drinks.  Chocolate.  Alcohol.  Stop smoking if you smoke.  Reduce your stress and anxiety. Things that can help you relax include:  A method of controlling things in your body, such as your heartbeats, with your mind (biofeedback).  Yoga.  Meditation.  Physical activity such as swimming, jogging, or walking.  Get plenty of rest and sleep. SEEK MEDICAL CARE IF:   You continue to have a fast or irregular heartbeat beyond 24 hours.  Your palpitations occur more often. SEEK IMMEDIATE MEDICAL CARE IF:  You have chest pain or shortness of breath.  You have a severe headache.  You feel dizzy or you faint. MAKE SURE YOU:  Understand these instructions.  Will watch your condition.  Will get help right away if you are not doing well or get worse.   This information is not intended to replace advice given to you by your health care provider. Make sure you discuss any questions you have with your health care provider.   Document Released: 03/03/2000 Document Revised: 03/11/2013 Document Reviewed: 05/05/2011 Elsevier Interactive Patient Education [employer].

## 2015-05-19 NOTE — ED Notes (Signed)
Pt reports substernal CP onset 2 days ago but pain became worse tonight. He reports SOB as well. He states he has been out of all of his meds for 2 weeks - nitro, BP and heart meds.

## 2015-05-19 NOTE — ED Notes (Signed)
Pt asleep and giving brief answers during assessment.

## 2015-05-28 ENCOUNTER — Other Ambulatory Visit: Payer: Self-pay

## 2015-05-28 ENCOUNTER — Telehealth: Payer: Self-pay | Admitting: *Deleted

## 2015-05-28 DIAGNOSIS — I10 Essential (primary) hypertension: Secondary | ICD-10-CM

## 2015-05-28 DIAGNOSIS — E785 Hyperlipidemia, unspecified: Secondary | ICD-10-CM

## 2015-05-28 MED ORDER — CARVEDILOL 25 MG PO TABS: 25.0000 mg | ORAL_TABLET | Freq: Two times a day (BID) | ORAL | Status: DC

## 2015-05-28 MED ORDER — AMLODIPINE BESYLATE 10 MG PO TABS: 10.0000 mg | ORAL_TABLET | Freq: Every day | ORAL | Status: DC

## 2015-05-28 MED ORDER — PAROXETINE HCL 20 MG PO TABS: 20.0000 mg | ORAL_TABLET | Freq: Every day | ORAL | Status: DC

## 2015-05-28 MED ORDER — ROSUVASTATIN CALCIUM 20 MG PO TABS: 20.0000 mg | ORAL_TABLET | Freq: Every day | ORAL | Status: DC

## 2015-05-28 MED ORDER — NITROSTAT 0.4 MG SL SUBL: 0.4000 mg | SUBLINGUAL_TABLET | SUBLINGUAL | Status: DC | PRN

## 2015-05-28 MED ORDER — PANTOPRAZOLE SODIUM 40 MG PO TBEC: 40.0000 mg | DELAYED_RELEASE_TABLET | Freq: Every day | ORAL | Status: DC

## 2015-05-28 MED ORDER — LISINOPRIL 10 MG PO TABS: 10.0000 mg | ORAL_TABLET | Freq: Every day | ORAL | Status: DC

## 2015-05-28 MED ORDER — FLUTICASONE PROPIONATE 50 MCG/ACT NA SUSP: 1.0000 | Freq: Two times a day (BID) | NASAL | Status: DC

## 2015-05-28 NOTE — Telephone Encounter (Signed)
All meds requested except the atripla have been refilled and sent into pharmacy. Patient will need to be by [facility] to have atripla refilled. I have called Orland Mustard, RN and left a message asking her to reach out to patient for an appointment.

## 2015-05-28 NOTE — Telephone Encounter (Signed)
Tammy,  Please see message below and if you have any questions please call me at the [facility] [phone]. Thanks!

## 2015-05-28 NOTE — Telephone Encounter (Signed)
Pt called requesting refills on the following prescriptions: lisinipril, amlodipine, carvedilol, nitrostat, paroxetine, paxil, protonix, atripla, flonase, crestor. Pharmacy is [facility] on [address]. Thanks!

## 2015-07-13 ENCOUNTER — Telehealth: Payer: Self-pay

## 2015-07-13 NOTE — Telephone Encounter (Signed)
Phone number invalid.  Will refer patient to [facility].   Laverle Patter, RN

## 2015-07-18 ENCOUNTER — Emergency Department (HOSPITAL_COMMUNITY): Payer: Medicare Other

## 2015-07-18 ENCOUNTER — Encounter (HOSPITAL_COMMUNITY): Payer: Self-pay | Admitting: Emergency Medicine

## 2015-07-18 ENCOUNTER — Emergency Department (HOSPITAL_COMMUNITY)
Admission: EM | Admit: 2015-07-18 | Discharge: 2015-07-18 | Disposition: A | Discharge status: Home / Self Care | Payer: Medicare Other | Attending: Emergency Medicine | Admitting: Emergency Medicine

## 2015-07-18 DIAGNOSIS — Z87438 Personal history of other diseases of male genital organs: Secondary | ICD-10-CM | POA: Diagnosis not present

## 2015-07-18 DIAGNOSIS — Z7951 Long term (current) use of inhaled steroids: Secondary | ICD-10-CM | POA: Diagnosis not present

## 2015-07-18 DIAGNOSIS — Z79899 Other long term (current) drug therapy: Secondary | ICD-10-CM | POA: Diagnosis not present

## 2015-07-18 DIAGNOSIS — M25472 Effusion, left ankle: Secondary | ICD-10-CM

## 2015-07-18 DIAGNOSIS — F17210 Nicotine dependence, cigarettes, uncomplicated: Secondary | ICD-10-CM | POA: Diagnosis not present

## 2015-07-18 DIAGNOSIS — Z7982 Long term (current) use of aspirin: Secondary | ICD-10-CM | POA: Diagnosis not present

## 2015-07-18 DIAGNOSIS — E785 Hyperlipidemia, unspecified: Secondary | ICD-10-CM

## 2015-07-18 DIAGNOSIS — F419 Anxiety disorder, unspecified: Secondary | ICD-10-CM | POA: Insufficient documentation

## 2015-07-18 DIAGNOSIS — Z760 Encounter for issue of repeat prescription: Secondary | ICD-10-CM | POA: Insufficient documentation

## 2015-07-18 DIAGNOSIS — Z859 Personal history of malignant neoplasm, unspecified: Secondary | ICD-10-CM | POA: Diagnosis not present

## 2015-07-18 DIAGNOSIS — I10 Essential (primary) hypertension: Secondary | ICD-10-CM | POA: Insufficient documentation

## 2015-07-18 DIAGNOSIS — B20 Human immunodeficiency virus [HIV] disease: Secondary | ICD-10-CM | POA: Insufficient documentation

## 2015-07-18 DIAGNOSIS — F329 Major depressive disorder, single episode, unspecified: Secondary | ICD-10-CM | POA: Diagnosis not present

## 2015-07-18 LAB — CBC
HCT: 39.9 % (ref 39.0–52.0)
HEMOGLOBIN: 12.8 g/dL — AB (ref 13.0–17.0)
MCH: 31.4 pg (ref 26.0–34.0)
MCHC: 32.1 g/dL (ref 30.0–36.0)
MCV: 97.8 fL (ref 78.0–100.0)
PLATELETS: 198 10*3/uL (ref 150–400)
RBC: 4.08 MIL/uL — ABNORMAL LOW (ref 4.22–5.81)
RDW: 12.1 % (ref 11.5–15.5)
WBC: 5.2 10*3/uL (ref 4.0–10.5)

## 2015-07-18 LAB — BRAIN NATRIURETIC PEPTIDE: B Natriuretic Peptide: 7 pg/mL (ref 0.0–100.0)

## 2015-07-18 LAB — BASIC METABOLIC PANEL
ANION GAP: 8 (ref 5–15)
BUN: 14 mg/dL (ref 6–20)
CALCIUM: 8.8 mg/dL — AB (ref 8.9–10.3)
CO2: 24 mmol/L (ref 22–32)
CREATININE: 1.22 mg/dL (ref 0.61–1.24)
Chloride: 107 mmol/L (ref 101–111)
GLUCOSE: 105 mg/dL — AB (ref 65–99)
Potassium: 4 mmol/L (ref 3.5–5.1)
Sodium: 139 mmol/L (ref 135–145)

## 2015-07-18 LAB — I-STAT TROPONIN, ED: TROPONIN I, POC: 0 ng/mL (ref 0.00–0.08)

## 2015-07-18 LAB — D-DIMER, QUANTITATIVE: D-Dimer, Quant: 0.31 ug/mL-FEU (ref 0.00–0.50)

## 2015-07-18 MED ORDER — CARVEDILOL 25 MG PO TABS: 25.0000 mg | ORAL_TABLET | Freq: Two times a day (BID) | ORAL | Status: DC

## 2015-07-18 MED ORDER — LISINOPRIL 10 MG PO TABS: 10.0000 mg | ORAL_TABLET | Freq: Every day | ORAL | Status: DC

## 2015-07-18 MED ORDER — OXYCODONE-ACETAMINOPHEN 5-325 MG PO TABS: 1.0000 | ORAL_TABLET | Freq: Three times a day (TID) | ORAL | Status: DC | PRN

## 2015-07-18 MED ORDER — OXYCODONE-ACETAMINOPHEN 5-325 MG PO TABS
1.0000 | ORAL_TABLET | Freq: Once | ORAL | Status: AC
  Administered 2015-07-18: 1 via ORAL
  Filled 2015-07-18: qty 1

## 2015-07-18 MED ORDER — PANTOPRAZOLE SODIUM 40 MG PO TBEC: 40.0000 mg | DELAYED_RELEASE_TABLET | Freq: Every day | ORAL | Status: DC

## 2015-07-18 MED ORDER — NITROGLYCERIN 0.4 MG SL SUBL: 0.4000 mg | SUBLINGUAL_TABLET | SUBLINGUAL | Status: DC | PRN

## 2015-07-18 MED ORDER — ROSUVASTATIN CALCIUM 20 MG PO TABS: 20.0000 mg | ORAL_TABLET | Freq: Every day | ORAL | Status: DC

## 2015-07-18 MED ORDER — AMLODIPINE BESYLATE 10 MG PO TABS: 10.0000 mg | ORAL_TABLET | Freq: Every day | ORAL | Status: DC

## 2015-07-18 MED ORDER — EFAVIRENZ-EMTRICITAB-TENOFOVIR 600-200-300 MG PO TABS: 1.0000 | ORAL_TABLET | Freq: Every day | ORAL | Status: DC

## 2015-07-18 MED ORDER — PREDNISONE 50 MG PO TABS: ORAL_TABLET | ORAL | Status: DC

## 2015-07-18 MED ORDER — PAROXETINE HCL 20 MG PO TABS: 20.0000 mg | ORAL_TABLET | ORAL | Status: DC

## 2015-07-18 MED ORDER — PREDNISONE 20 MG PO TABS
60.0000 mg | ORAL_TABLET | Freq: Once | ORAL | Status: AC
  Administered 2015-07-18: 60 mg via ORAL
  Filled 2015-07-18: qty 3

## 2015-07-18 NOTE — ED Provider Notes (Signed)
CSN: [id number]     Arrival date & time 07/18/15  0018 History  By signing my name below, I, [name], attest that this documentation has been prepared under the direction and in the presence of Ripley Fraise, MD. Electronically Signed: [name], ED Scribe. 07/18/2015. 3:28 AM.    Chief Complaint  Patient presents with  . Leg Pain  . Leg Swelling   Patient is a 55 y.o. male presenting with leg pain. The history is provided by the patient. No language interpreter was used.  Leg Pain Location:  Ankle Time since incident:  2 weeks Injury: no   Ankle location:  L ankle Pain details:    Quality:  Throbbing   Radiates to:  L leg   Severity:  Moderate   Onset quality:  Gradual   Duration:  2 weeks   Timing:  Constant   Progression:  Worsening Chronicity:  New Prior injury to area:  No Relieved by:  Nothing Worsened by:  Activity and bearing weight Ineffective treatments:  None tried Associated symptoms: swelling   Associated symptoms: no decreased ROM, no fever, no muscle weakness, no numbness and no tingling    HPI Comments: Nathan Santana is a 55 y.o. male with h/o HIV, HTN who presents to the Emergency Department complaining of moderate left ankle pain and swelling onset 2 weeks ago and worsened tonight. Pt states he stands frequently at his job, but denies recent fall, trauma or injury. No h/o PE/DVT, recent travel or surgery. Pt states his pain is worsened with palpation, movement, ambulation and weight-bearing. He notes that his pain is similar to h/o gout. He is ambulatory, but with pain. Pt also reports a recent weight loss of ~40 lbs. He also complains that he has run out of all of his medications, including his HIV cocktail and NTG. Denies CP, abdominal pain, emesis, fever, nausea, numbness, weakness, paresthesia, calf pain.   Past Medical History  Diagnosis Date  . HIV infection ([place])   . Cancer ([place])   . Hypertension   . Angina pectoris ([place])   . Scrotal cyst    . Anxiety   . Depression    Past Surgical History  Procedure Laterality Date  . Scrotal turmor removed    . [place]      cancer tumor removed in 2006    Family History  Problem Relation Age of Onset  . Hypertension Other   . CAD Sister    Social History  Substance Use Topics  . Smoking status: Current Some Day Smoker -- 0.50 packs/day    Types: Cigars  . Smokeless tobacco: None  . Alcohol Use: No     Comment: one beer weekly    Review of Systems  Constitutional: Negative for fever.  Cardiovascular: Negative for chest pain.  Gastrointestinal: Negative for nausea, vomiting and abdominal pain.  Musculoskeletal: Positive for joint swelling (left ankle) and arthralgias (left ankle).  All other systems reviewed and are negative.  Allergies  Review of patient's allergies indicates no known allergies.  Home Medications   Prior to Admission medications   Medication Sig Start Date End Date Taking? Authorizing Provider  amLODipine (NORVASC) 10 MG tablet Take 1 tablet (10 mg total) by mouth daily. 05/28/15   Dorena Dew, FNP  aspirin 81 MG tablet Take 1 tablet (81 mg total) by mouth daily. 03/01/15   Dorena Dew, FNP  ATRIPLA 600-200-300 MG tablet Take 1 tab daily at bedtime 02/05/15   Benjamine Mola, FNP  carvedilol (COREG) 25 MG tablet Take 1 tablet (25 mg total) by mouth 2 (two) times daily with a meal. 05/28/15   Dorena Dew, FNP  fluticasone (FLONASE) 50 MCG/ACT nasal spray Place 1 spray into both nostrils 2 (two) times daily. 05/28/15   Dorena Dew, FNP  ibuprofen (ADVIL,MOTRIN) 600 MG tablet Take 1 tablet (600 mg total) by mouth every 4 (four) hours as needed. 02/10/15   Theodis Blaze, MD  lisinopril (PRINIVIL,ZESTRIL) 10 MG tablet Take 1 tablet (10 mg total) by mouth daily. 05/28/15   Dorena Dew, FNP  NITROSTAT 0.4 MG SL tablet Place 1 tablet (0.4 mg total) under the tongue every 5 (five) minutes as needed for chest pain. 05/28/15   Dorena Dew, FNP   pantoprazole (PROTONIX) 40 MG tablet Take 1 tablet (40 mg total) by mouth daily. 05/28/15   Dorena Dew, FNP  PARoxetine (PAXIL) 20 MG tablet Take 1 tablet (20 mg total) by mouth daily. 05/28/15   Dorena Dew, FNP  rosuvastatin (CRESTOR) 20 MG tablet Take 1 tablet (20 mg total) by mouth daily. 05/28/15   Dorena Dew, FNP   BP 168/105 mmHg  Pulse 65  Temp(Src) 98.5 F (36.9 C) (Oral)  Resp 13  Ht 5\' 11"  (1.803 m)  Wt 184 lb 11.2 oz (83.779 kg)  BMI 25.77 kg/m2  SpO2 97% Physical Exam CONSTITUTIONAL: Well developed/well nourished HEAD: Normocephalic/atraumatic EYES: EOMI/PERRL ENMT: Mucous membranes moist NECK: supple no meningeal signs SPINE/BACK:entire spine nontender CV: S1/S2 noted, no murmurs/rubs/gallops noted LUNGS: Lungs are clear to auscultation bilaterally, no apparent distress ABDOMEN: soft, nontender, no rebound or guarding, bowel sounds noted throughout abdomen GU:no cva tenderness NEURO: Pt is awake/alert/appropriate, moves all extremitiesx4.  No facial droop.   EXTREMITIES: pulses normal/equal, full ROM. Tenderness and edema to left ankle. Able to flex and extend the ankle. Minimal erythema noted. No signs of trauma. No calf tenderness, but edema noted to lower extremity. No abrasions or skin wounds noted SKIN: warm, color normal PSYCH: no abnormalities of mood noted, alert and oriented to situation   ED Course  Procedures  DIAGNOSTIC STUDIES: Oxygen Saturation is 99% on RA, normal by my interpretation.    COORDINATION OF CARE: 3:23 AM Discussed treatment plan with pt at bedside which includes lab work, XR and pt agreed to plan.  5:16 AM Workup negative Low risk for DVT and d-dimer negative He has mild edema tenderness to left ankle ?gout.  I doubt septic arthritis given history of pain for 2 weeks and his exam Will give short course of pain meds/prednisone He requests refills on all meds except for ASA Advised need for PCP evaluation  Labs  Review Labs Reviewed  BASIC METABOLIC PANEL - Abnormal; Notable for the following:    Glucose, Bld 105 (*)    Calcium 8.8 (*)    All other components within normal limits  CBC - Abnormal; Notable for the following:    RBC 4.08 (*)    Hemoglobin 12.8 (*)    All other components within normal limits  BRAIN NATRIURETIC PEPTIDE  D-DIMER, QUANTITATIVE (NOT AT [facility])  I-STAT TROPOININ, ED    Imaging Review Dg Ankle Complete Left  07/18/2015  CLINICAL DATA:  Left ankle pain and swelling for several days with no known injury. EXAM: LEFT ANKLE COMPLETE - 3+ VIEW COMPARISON:  None. FINDINGS: There is no evidence of fracture, dislocation, or joint effusion. The foot appears flat in the lateral projection but images are oblique  and nonweightbearing. Small heel spur. IMPRESSION: No explanation for acute pain. Electronically Signed   By: Monte Fantasia M.D.   On: 07/18/2015 04:22   I have personally reviewed and evaluated these images and lab results as part of my medical decision-making.   EKG Interpretation   Date/Time:  Sunday July 18 2015 00:29:45 EDT Ventricular Rate:  81 PR Interval:  166 QRS Duration: 138 QT Interval:  434 QTC Calculation: 504 R Axis:   -66 Text Interpretation:  Normal sinus rhythm Left axis deviation Right bundle  branch block Abnormal ECG No significant change since last tracing  Confirmed by Christy Gentles  MD, Elenore Rota ([id number]) on 07/18/2015 2:37:42 AM      MDM   Final diagnoses:  Left ankle swelling    Nursing notes including past medical history and social history reviewed and considered in documentation xrays/imaging reviewed by myself and considered during evaluation Labs/vital reviewed myself and considered during evaluation    I personally performed the services described in this documentation, which was scribed in my presence. The recorded information has been reviewed and is accurate.       Ripley Fraise, MD 07/18/15 [phone]

## 2015-07-18 NOTE — ED Notes (Signed)
Patient arrives with complaint of lower extremity pain and swelling. States history of numerous problems. States he has been unable to get his medications for a few weeks. Patient unable to provide list of medications he has been without. States that recently he has "begun to feel weak".

## 2015-08-16 ENCOUNTER — Encounter (HOSPITAL_COMMUNITY): Payer: Self-pay | Admitting: Nurse Practitioner

## 2015-08-16 ENCOUNTER — Emergency Department (HOSPITAL_COMMUNITY)
Admission: EM | Admit: 2015-08-16 | Discharge: 2015-08-17 | Disposition: A | Discharge status: Other Acute Inpatient Hospital | Payer: Medicare Other | Attending: Emergency Medicine | Admitting: Emergency Medicine

## 2015-08-16 ENCOUNTER — Emergency Department (HOSPITAL_COMMUNITY): Payer: Medicare Other

## 2015-08-16 DIAGNOSIS — Z79899 Other long term (current) drug therapy: Secondary | ICD-10-CM | POA: Insufficient documentation

## 2015-08-16 DIAGNOSIS — R443 Hallucinations, unspecified: Secondary | ICD-10-CM

## 2015-08-16 DIAGNOSIS — G478 Other sleep disorders: Secondary | ICD-10-CM | POA: Insufficient documentation

## 2015-08-16 DIAGNOSIS — R45851 Suicidal ideations: Secondary | ICD-10-CM | POA: Insufficient documentation

## 2015-08-16 DIAGNOSIS — B20 Human immunodeficiency virus [HIV] disease: Secondary | ICD-10-CM | POA: Diagnosis not present

## 2015-08-16 DIAGNOSIS — Z7982 Long term (current) use of aspirin: Secondary | ICD-10-CM | POA: Insufficient documentation

## 2015-08-16 DIAGNOSIS — R600 Localized edema: Secondary | ICD-10-CM | POA: Diagnosis not present

## 2015-08-16 DIAGNOSIS — Z87438 Personal history of other diseases of male genital organs: Secondary | ICD-10-CM | POA: Diagnosis not present

## 2015-08-16 DIAGNOSIS — M25572 Pain in left ankle and joints of left foot: Secondary | ICD-10-CM | POA: Diagnosis not present

## 2015-08-16 DIAGNOSIS — F419 Anxiety disorder, unspecified: Secondary | ICD-10-CM | POA: Insufficient documentation

## 2015-08-16 DIAGNOSIS — F32A Depression, unspecified: Secondary | ICD-10-CM

## 2015-08-16 DIAGNOSIS — Z859 Personal history of malignant neoplasm, unspecified: Secondary | ICD-10-CM | POA: Diagnosis not present

## 2015-08-16 DIAGNOSIS — I10 Essential (primary) hypertension: Secondary | ICD-10-CM | POA: Insufficient documentation

## 2015-08-16 DIAGNOSIS — F329 Major depressive disorder, single episode, unspecified: Secondary | ICD-10-CM | POA: Diagnosis present

## 2015-08-16 DIAGNOSIS — F17210 Nicotine dependence, cigarettes, uncomplicated: Secondary | ICD-10-CM | POA: Diagnosis not present

## 2015-08-16 LAB — SALICYLATE LEVEL

## 2015-08-16 LAB — COMPREHENSIVE METABOLIC PANEL
ALT: 16 U/L — AB (ref 17–63)
AST: 20 U/L (ref 15–41)
Albumin: 3.3 g/dL — ABNORMAL LOW (ref 3.5–5.0)
Alkaline Phosphatase: 106 U/L (ref 38–126)
Anion gap: 4 — ABNORMAL LOW (ref 5–15)
BUN: 12 mg/dL (ref 6–20)
CHLORIDE: 108 mmol/L (ref 101–111)
CO2: 26 mmol/L (ref 22–32)
Calcium: 8.8 mg/dL — ABNORMAL LOW (ref 8.9–10.3)
Creatinine, Ser: 1.02 mg/dL (ref 0.61–1.24)
GFR calc Af Amer: >60 mL/min (ref >60)
Glucose, Bld: 94 mg/dL (ref 65–99)
POTASSIUM: 3.7 mmol/L (ref 3.5–5.1)
SODIUM: 138 mmol/L (ref 135–145)
Total Bilirubin: 0.2 mg/dL — ABNORMAL LOW (ref 0.3–1.2)
Total Protein: 6.8 g/dL (ref 6.5–8.1)

## 2015-08-16 LAB — RAPID URINE DRUG SCREEN, HOSP PERFORMED
Amphetamines: NOT DETECTED
BENZODIAZEPINES: NOT DETECTED
Barbiturates: NOT DETECTED
Cocaine: POSITIVE — AB
OPIATES: NOT DETECTED
TETRAHYDROCANNABINOL: POSITIVE — AB

## 2015-08-16 LAB — ACETAMINOPHEN LEVEL: Acetaminophen (Tylenol), Serum: <10 ug/mL — ABNORMAL LOW (ref 10–30)

## 2015-08-16 LAB — CBC
HCT: 40 % (ref 39.0–52.0)
HEMOGLOBIN: 12.7 g/dL — AB (ref 13.0–17.0)
MCH: 30.7 pg (ref 26.0–34.0)
MCHC: 31.8 g/dL (ref 30.0–36.0)
MCV: 96.6 fL (ref 78.0–100.0)
PLATELETS: 147 10*3/uL — AB (ref 150–400)
RBC: 4.14 MIL/uL — AB (ref 4.22–5.81)
RDW: 12.7 % (ref 11.5–15.5)
WBC: 3.6 10*3/uL — AB (ref 4.0–10.5)

## 2015-08-16 LAB — ETHANOL

## 2015-08-16 MED ORDER — LISINOPRIL 10 MG PO TABS
10.0000 mg | ORAL_TABLET | Freq: Every day | ORAL | Status: DC
  Administered 2015-08-16 – 2015-08-17 (×2): 10 mg via ORAL
  Filled 2015-08-16 (×2): qty 1

## 2015-08-16 MED ORDER — ALUM & MAG HYDROXIDE-SIMETH 200-200-20 MG/5ML PO SUSP: 30.0000 mL | ORAL | Status: DC | PRN

## 2015-08-16 MED ORDER — AMLODIPINE BESYLATE 5 MG PO TABS
10.0000 mg | ORAL_TABLET | Freq: Every day | ORAL | Status: DC
  Administered 2015-08-16 – 2015-08-17 (×2): 10 mg via ORAL
  Filled 2015-08-16 (×2): qty 2

## 2015-08-16 MED ORDER — PANTOPRAZOLE SODIUM 40 MG PO TBEC
40.0000 mg | DELAYED_RELEASE_TABLET | Freq: Every day | ORAL | Status: DC
  Administered 2015-08-17: 40 mg via ORAL
  Filled 2015-08-16: qty 1

## 2015-08-16 MED ORDER — CARVEDILOL 12.5 MG PO TABS
25.0000 mg | ORAL_TABLET | Freq: Two times a day (BID) | ORAL | Status: DC
Start: 2015-08-17 — End: 2015-08-17
  Administered 2015-08-17: 25 mg via ORAL
  Filled 2015-08-16: qty 2

## 2015-08-16 MED ORDER — EFAVIRENZ-EMTRICITAB-TENOFOVIR 600-200-300 MG PO TABS
1.0000 | ORAL_TABLET | Freq: Every day | ORAL | Status: DC
  Administered 2015-08-16: 1 via ORAL
  Filled 2015-08-16: qty 1

## 2015-08-16 MED ORDER — LORAZEPAM 1 MG PO TABS: 1.0000 mg | ORAL_TABLET | Freq: Three times a day (TID) | ORAL | Status: DC | PRN

## 2015-08-16 MED ORDER — IBUPROFEN 400 MG PO TABS
600.0000 mg | ORAL_TABLET | Freq: Three times a day (TID) | ORAL | Status: DC | PRN
  Administered 2015-08-16: 600 mg via ORAL
  Filled 2015-08-16: qty 1

## 2015-08-16 MED ORDER — ONDANSETRON HCL 4 MG PO TABS: 4.0000 mg | ORAL_TABLET | Freq: Three times a day (TID) | ORAL | Status: DC | PRN

## 2015-08-16 MED ORDER — ZOLPIDEM TARTRATE 5 MG PO TABS
5.0000 mg | ORAL_TABLET | Freq: Every evening | ORAL | Status: DC | PRN
Start: 2015-08-16 — End: 2015-08-17

## 2015-08-16 MED ORDER — ASPIRIN EC 81 MG PO TBEC
81.0000 mg | DELAYED_RELEASE_TABLET | Freq: Every day | ORAL | Status: DC
  Administered 2015-08-16 – 2015-08-17 (×2): 81 mg via ORAL
  Filled 2015-08-16 (×2): qty 1

## 2015-08-16 MED ORDER — ROSUVASTATIN CALCIUM 20 MG PO TABS
20.0000 mg | ORAL_TABLET | Freq: Every day | ORAL | Status: DC
  Administered 2015-08-16: 20 mg via ORAL
  Filled 2015-08-16: qty 1

## 2015-08-16 MED ORDER — PAROXETINE HCL 20 MG PO TABS
20.0000 mg | ORAL_TABLET | ORAL | Status: DC
  Administered 2015-08-17: 20 mg via ORAL
  Filled 2015-08-16: qty 1

## 2015-08-16 MED ORDER — ACETAMINOPHEN 325 MG PO TABS: 650.0000 mg | ORAL_TABLET | ORAL | Status: DC | PRN
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
## 2015-08-16 NOTE — ED Notes (Signed)
Patient still talking to [facility] at this time.

## 2015-08-16 NOTE — ED Notes (Signed)
He presents with 2 complaints. He c/o several day history of L ankle pain and swelling. He has a history of gout and feels it might be gout. He also c/o 2 week history of increased depression. He reports he has not been taking his medications regularly. hes been "feeling down, like i just dont care what happens to me." reports his hygiene has been poor and he hasnt felt like taking care of himself. Reports racing thoughts and feeling like he needs to get away to a quiet place. He reports he has had thoughts of hurting himself and others. He reports occasional cocaine, marijuana, alcohol use, none over the past few days.

## 2015-08-16 NOTE — ED Provider Notes (Signed)
CSN: [id number]     Arrival date & time 08/16/15  1720 History   First MD Initiated Contact with Patient 08/16/15 1812     Chief Complaint  Patient presents with  . Ankle Pain  . Depression     (Consider location/radiation/quality/duration/timing/severity/associated sxs/prior Treatment) HPI  Nathan Santana is a 55 y.o. male, with a history of HIV, hypertension, anxiety, and depression, presenting to the ED with Increased depression over the last 2 weeks. Patient states he has not taken his antidepressants in at least a week. Patient states, "I just feel like I don't care anymore. I don't care what happens to me. I just want to go to sleep and never wake up." Patient denies having a plan. Denies homicidal ideations. Patient denies regular alcohol use. But endorses occasional cocaine, marijuana, and alcohol use. Last use was "a few days ago." Patient denies A/V hallucinations. Patient complains of left ankle pain and swelling that has been present for over the past month. Patient states he was previously diagnosed with possible gout, treated in the ED, and improved. The pain and swelling returned. Pain is moderate, throbbing, nonradiating. Patient does not know when his CD4 T-cell and HIV viral load were analyzed. Patient is not taking his antiviral medication. Patient also advises he is not taking any of his other medications.     Past Medical History  Diagnosis Date  . HIV infection ([place])   . Cancer ([place])   . Hypertension   . Angina pectoris ([place])   . Scrotal cyst   . Anxiety   . Depression    Past Surgical History  Procedure Laterality Date  . Scrotal turmor removed    . Caner      cancer tumor removed in 2006    Family History  Problem Relation Age of Onset  . Hypertension Other   . CAD Sister    Social History  Substance Use Topics  . Smoking status: Current Some Day Smoker -- 0.50 packs/day    Types: Cigars  . Smokeless tobacco: None  . Alcohol Use: No      Comment: one beer weekly    Review of Systems  Constitutional: Negative for fever and chills.  Respiratory: Negative for shortness of breath.   Gastrointestinal: Negative for nausea and vomiting.  Musculoskeletal: Positive for joint swelling and arthralgias.  Skin: Negative for color change and pallor.  Psychiatric/Behavioral: Positive for suicidal ideas and sleep disturbance.  All other systems reviewed and are negative.     Allergies  Review of patient's allergies indicates no known allergies.  Home Medications   Prior to Admission medications   Medication Sig Start Date End Date Taking? Authorizing Provider  amLODipine (NORVASC) 10 MG tablet Take 1 tablet (10 mg total) by mouth daily. 07/18/15  Yes Ripley Fraise, MD  aspirin 81 MG tablet Take 1 tablet (81 mg total) by mouth daily. 03/01/15  Yes Dorena Dew, FNP  carvedilol (COREG) 25 MG tablet Take 1 tablet (25 mg total) by mouth 2 (two) times daily with a meal. 07/18/15  Yes Ripley Fraise, MD  efavirenz-emtricitabine-tenofovir (ATRIPLA) 600-200-300 MG tablet Take 1 tablet by mouth at bedtime. 07/18/15  Yes Ripley Fraise, MD  fluticasone (FLONASE) 50 MCG/ACT nasal spray Place 1 spray into both nostrils 2 (two) times daily. 05/28/15  Yes Dorena Dew, FNP  hydrocortisone cream 1 % Apply 1 application topically 2 (two) times daily as needed for itching.   Yes Historical Provider, MD  lisinopril (PRINIVIL,ZESTRIL) 10 MG tablet  Take 1 tablet (10 mg total) by mouth daily. 07/18/15  Yes Ripley Fraise, MD  nitroGLYCERIN (NITROSTAT) 0.4 MG SL tablet Place 1 tablet (0.4 mg total) under the tongue every 5 (five) minutes as needed for chest pain. 07/18/15  Yes Ripley Fraise, MD  pantoprazole (PROTONIX) 40 MG tablet Take 1 tablet (40 mg total) by mouth daily. 07/18/15  Yes Ripley Fraise, MD  PARoxetine (PAXIL) 20 MG tablet Take 1 tablet (20 mg total) by mouth every morning. 07/18/15  Yes Ripley Fraise, MD  rosuvastatin (CRESTOR)  20 MG tablet Take 1 tablet (20 mg total) by mouth daily. Patient taking differently: Take 20 mg by mouth at bedtime.  07/18/15  Yes Ripley Fraise, MD   BP 160/98 mmHg  Pulse 75  Temp(Src) 98.8 F (37.1 C) (Oral)  Resp 18  SpO2 99% Physical Exam  Constitutional: He is oriented to person, place, and time. He appears well-developed and well-nourished. No distress.  HENT:  Head: Normocephalic and atraumatic.  Eyes: Conjunctivae are normal.  Neck: Neck supple.  Cardiovascular: Normal rate, regular rhythm, normal heart sounds and intact distal pulses.   Pulmonary/Chest: Effort normal and breath sounds normal. No respiratory distress.  Abdominal: Soft. There is no tenderness. There is no guarding.  Musculoskeletal: He exhibits edema and tenderness.  Swelling and tenderness to the left medial ankle. Range of motion intact. No increased warmth or erythema.  Lymphadenopathy:    He has no cervical adenopathy.  Neurological: He is alert and oriented to person, place, and time.  Left foot and ankle: No sensory deficits. Strength 5 out of 5.  Skin: Skin is warm and dry. He is not diaphoretic.  Psychiatric: His speech is normal and behavior is normal. He exhibits a depressed mood.  Nursing note and vitals reviewed.   ED Course  Procedures (including critical care time) Labs Review Labs Reviewed  COMPREHENSIVE METABOLIC PANEL - Abnormal; Notable for the following:    Calcium 8.8 (*)    Albumin 3.3 (*)    ALT 16 (*)    Total Bilirubin 0.2 (*)    Anion gap 4 (*)    All other components within normal limits  ACETAMINOPHEN LEVEL - Abnormal; Notable for the following:    Acetaminophen (Tylenol), Serum <10 (*)    All other components within normal limits  CBC - Abnormal; Notable for the following:    WBC 3.6 (*)    RBC 4.14 (*)    Hemoglobin 12.7 (*)    Platelets 147 (*)    All other components within normal limits  ETHANOL  SALICYLATE LEVEL  URINE RAPID DRUG SCREEN, HOSP PERFORMED   T-HELPER CELLS (CD4) COUNT (NOT AT [facility])  HIV 1 RNA QUANT-NO REFLEX-BLD    HEMOGLOBIN  Date Value Ref Range Status  08/16/2015 12.7* 13.0 - 17.0 g/dL Final  07/18/2015 12.8* 13.0 - 17.0 g/dL Final  05/19/2015 13.3 13.0 - 17.0 g/dL Final  02/08/2015 12.5* 13.0 - 17.0 g/dL Final     Imaging Review Dg Ankle Complete Left  08/16/2015  CLINICAL DATA:  Medial pain and swelling EXAM: LEFT ANKLE COMPLETE - 3+ VIEW COMPARISON:  07/18/2015 FINDINGS: Ankle joint is normal. No joint effusion. Normal alignment no fracture. Medial soft tissue swelling. IMPRESSION: Medial soft tissue swelling.  Negative for fracture or arthropathy. Electronically Signed   By: Franchot Gallo M.D.   On: 08/16/2015 19:21   I have personally reviewed and evaluated these images and lab results as part of my medical decision-making.   EKG  Interpretation None      Medications  alum & mag hydroxide-simeth (MAALOX/MYLANTA) 200-200-20 MG/5ML suspension 30 mL (not administered)  ondansetron (ZOFRAN) tablet 4 mg (not administered)  zolpidem (AMBIEN) tablet 5 mg (not administered)  ibuprofen (ADVIL,MOTRIN) tablet 600 mg (600 mg Oral Given 08/16/15 1858)  acetaminophen (TYLENOL) tablet 650 mg (not administered)  LORazepam (ATIVAN) tablet 1 mg (not administered)  amLODipine (NORVASC) tablet 10 mg (10 mg Oral Given 08/16/15 1857)  aspirin EC tablet 81 mg (81 mg Oral Given 08/16/15 1857)  carvedilol (COREG) tablet 25 mg (not administered)  efavirenz-emtricitabine-tenofovir (ATRIPLA) 600-200-300 MG per tablet 1 tablet (not administered)  lisinopril (PRINIVIL,ZESTRIL) tablet 10 mg (10 mg Oral Given 08/16/15 1858)  pantoprazole (PROTONIX) EC tablet 40 mg (not administered)  PARoxetine (PAXIL) tablet 20 mg (not administered)  rosuvastatin (CRESTOR) tablet 20 mg (not administered)   Orders Placed This Encounter  Procedures  . DG Ankle Complete Left  . Comprehensive metabolic panel  . Ethanol  . Salicylate level  .  Acetaminophen level  . cbc  . Rapid urine drug screen (hospital performed)  . T-helper cells (CD4) count (not at [facility])  . HIV 1 RNA quant-no reflex-bld  . Diet Heart Room service appropriate?: Yes; Fluid consistency:: Thin  . Flight risk  . Sitter at bedside Type of sitter: Suicide precautions  . Vital signs  . Notify physician - Call MD / PA if:  . Sitter at bedside Type of sitter: Suicide precautions  . Apply ASO ankle  . Full code  . Consult to TTS  . Place in psych hold    MDM   Final diagnoses:  Depression    Cashis Dierker presents with depression over the last 2 weeks. Patient also complains of left ankle pain and swelling for the last month.  Findings and plan of care discussed with Gareth Morgan, MD. Dr. Billy Fischer personally evaluated and examined this patient.  Patient's ankle evaluated. X-ray negative for fracture or dislocation. Septic joint highly unlikely due to the duration. This also makes gout less likely as well. TTS consult placed. Patient placed in psych hold. Home medications ordered.   Filed Vitals:   08/16/15 1735 08/16/15 1800 08/16/15 1815 08/16/15 1830  BP: 160/98 169/104 169/107 183/116  Pulse: 75 71 68 70  Temp: 98.8 F (37.1 C)     TempSrc: Oral     Resp: 18     SpO2: 99% 100% 99% 100%      Lorayne Bender, PA-C 08/16/15 2005  Gareth Morgan, MD 08/18/15 1316

## 2015-08-16 NOTE — ED Notes (Signed)
Patient reports that he is still hungry - provided him with [place] sandwich and apple juice.

## 2015-08-16 NOTE — ED Notes (Signed)
Gave pt [place] sandwich and sprite zero.  No complaints of distress, appears calm.

## 2015-08-16 NOTE — ED Notes (Signed)
RN made aware of patient.  Will remain in triage until sitter can be found.

## 2015-08-16 NOTE — ED Notes (Signed)
MD at bedside. 

## 2015-08-16 NOTE — ED Provider Notes (Signed)
Patient has been assessed by TTS meets criteria for dimension.  There is no bed available at [place] placement is being sought  Junius Creamer, NP 08/16/15 2248  Junius Creamer, NP 08/16/15 2249  Gareth Morgan, MD 08/18/15 1317

## 2015-08-16 NOTE — ED Notes (Signed)
TTS machine placed in room. [place] to call soon.

## 2015-08-17 ENCOUNTER — Inpatient Hospital Stay (HOSPITAL_COMMUNITY)
Admission: AD | Admit: 2015-08-17 | Discharge: 2015-08-20 | DRG: 885 | Disposition: A | Discharge status: Home / Self Care | Payer: Medicare Other | Source: Intra-hospital | Attending: Psychiatry | Admitting: Psychiatry

## 2015-08-17 ENCOUNTER — Encounter (HOSPITAL_COMMUNITY): Payer: Self-pay

## 2015-08-17 DIAGNOSIS — F329 Major depressive disorder, single episode, unspecified: Secondary | ICD-10-CM | POA: Diagnosis present

## 2015-08-17 DIAGNOSIS — Z590 Homelessness: Secondary | ICD-10-CM | POA: Diagnosis not present

## 2015-08-17 DIAGNOSIS — F17210 Nicotine dependence, cigarettes, uncomplicated: Secondary | ICD-10-CM | POA: Diagnosis present

## 2015-08-17 DIAGNOSIS — I10 Essential (primary) hypertension: Secondary | ICD-10-CM | POA: Diagnosis present

## 2015-08-17 DIAGNOSIS — F332 Major depressive disorder, recurrent severe without psychotic features: Secondary | ICD-10-CM | POA: Diagnosis present

## 2015-08-17 DIAGNOSIS — Z21 Asymptomatic human immunodeficiency virus [HIV] infection status: Secondary | ICD-10-CM | POA: Diagnosis present

## 2015-08-17 HISTORY — DX: Pure hypercholesterolemia, unspecified: E78.00

## 2015-08-17 LAB — T-HELPER CELLS (CD4) COUNT (NOT AT ARMC)
CD4 % Helper T Cell: 28 % — ABNORMAL LOW (ref 33–55)
CD4 T Cell Abs: 330 /uL — ABNORMAL LOW (ref 400–2700)

## 2015-08-17 MED ORDER — ENSURE ENLIVE PO LIQD: 237.0000 mL | Freq: Two times a day (BID) | ORAL | Status: DC

## 2015-08-17 MED ORDER — FLUTICASONE PROPIONATE 50 MCG/ACT NA SUSP
1.0000 | Freq: Two times a day (BID) | NASAL | Status: DC
  Administered 2015-08-17 – 2015-08-20 (×3): 1 via NASAL
  Filled 2015-08-17 (×2): qty 16

## 2015-08-17 MED ORDER — ALUM & MAG HYDROXIDE-SIMETH 200-200-20 MG/5ML PO SUSP: 30.0000 mL | ORAL | Status: DC | PRN

## 2015-08-17 MED ORDER — PANTOPRAZOLE SODIUM 40 MG PO TBEC
40.0000 mg | DELAYED_RELEASE_TABLET | Freq: Every day | ORAL | Status: DC
  Administered 2015-08-18 – 2015-08-20 (×3): 40 mg via ORAL
  Filled 2015-08-17 (×6): qty 1

## 2015-08-17 MED ORDER — TRAZODONE HCL 50 MG PO TABS
50.0000 mg | ORAL_TABLET | Freq: Every evening | ORAL | Status: DC | PRN
  Administered 2015-08-17 – 2015-08-18 (×2): 50 mg via ORAL
  Filled 2015-08-17 (×2): qty 1

## 2015-08-17 MED ORDER — PAROXETINE HCL 20 MG PO TABS
20.0000 mg | ORAL_TABLET | ORAL | Status: DC
  Administered 2015-08-18 – 2015-08-20 (×3): 20 mg via ORAL
  Filled 2015-08-17 (×6): qty 1

## 2015-08-17 MED ORDER — EFAVIRENZ-EMTRICITAB-TENOFOVIR 600-200-300 MG PO TABS
1.0000 | ORAL_TABLET | Freq: Every day | ORAL | Status: DC
  Administered 2015-08-17 – 2015-08-19 (×3): 1 via ORAL
  Filled 2015-08-17 (×6): qty 1

## 2015-08-17 MED ORDER — LISINOPRIL 10 MG PO TABS
10.0000 mg | ORAL_TABLET | Freq: Every day | ORAL | Status: DC
  Administered 2015-08-18 – 2015-08-20 (×3): 10 mg via ORAL
  Filled 2015-08-17 (×5): qty 1

## 2015-08-17 MED ORDER — AMLODIPINE BESYLATE 10 MG PO TABS
10.0000 mg | ORAL_TABLET | Freq: Every day | ORAL | Status: DC
  Administered 2015-08-18 – 2015-08-20 (×3): 10 mg via ORAL
  Filled 2015-08-17 (×5): qty 1

## 2015-08-17 MED ORDER — ACETAMINOPHEN 325 MG PO TABS
650.0000 mg | ORAL_TABLET | Freq: Four times a day (QID) | ORAL | Status: DC | PRN
  Administered 2015-08-18 (×2): 650 mg via ORAL
  Filled 2015-08-17 (×2): qty 2

## 2015-08-17 MED ORDER — NICOTINE POLACRILEX 2 MG MT GUM: 2.0000 mg | CHEWING_GUM | OROMUCOSAL | Status: DC | PRN

## 2015-08-17 MED ORDER — MAGNESIUM HYDROXIDE 400 MG/5ML PO SUSP: 30.0000 mL | Freq: Every day | ORAL | Status: DC | PRN

## 2015-08-17 MED ORDER — ASPIRIN EC 81 MG PO TBEC
81.0000 mg | DELAYED_RELEASE_TABLET | Freq: Every day | ORAL | Status: DC
  Administered 2015-08-18 – 2015-08-20 (×3): 81 mg via ORAL
  Filled 2015-08-17 (×5): qty 1

## 2015-08-17 MED ORDER — ROSUVASTATIN CALCIUM 20 MG PO TABS
20.0000 mg | ORAL_TABLET | Freq: Every day | ORAL | Status: DC
  Administered 2015-08-17 – 2015-08-20 (×4): 20 mg via ORAL
  Filled 2015-08-17 (×6): qty 1

## 2015-08-17 NOTE — Progress Notes (Signed)
Pt accepted to [facility] bed 400-2, attending Dr. Parke Poisson. Report # is [phone], pt can arrive anytime per [facility].  Sharren Bridge, MSW, LCSW Clinical Social Work, Disposition  08/17/2015 [phone]

## 2015-08-17 NOTE — ED Notes (Signed)
Pt on phone at nurses' desk. 

## 2015-08-17 NOTE — ED Provider Notes (Signed)
Accepted to [facility]. Dr. Parke Poisson accepting.  Sherwood Gambler, MD 08/17/15 1355

## 2015-08-17 NOTE — ED Notes (Signed)
Patient was given a snack and drink. A regular diet ordered for lunch.

## 2015-08-17 NOTE — Progress Notes (Addendum)
Patient ID: Nathan Santana, male   DOB: May 19, 1960, 55 y.o.   MRN: [id number]  Initial Interdisciplinary Treatment Plan   PATIENT STRESSORS: Financial difficulties Loss of housing Marital or family conflict Medication change or noncompliance Occupational concerns   PATIENT STRENGTHS: Active sense of humor Average or above average intelligence Capable of independent living Communication skills General fund of knowledge Supportive family/friends Work skills   PROBLEM LIST: Problem List/Patient Goals Date to be addressed Date deferred Reason deferred Estimated date of resolution  "I want to get balanced" 08/17/2015      "Back on the right medications" 08/17/2015      "living in my truck, i'm not sure where I'll live."  08/17/2015      "I haven't been eating or sleeping much." 08/17/2015                                     DISCHARGE CRITERIA:  Improved stabilization in mood, thinking, and/or behavior Motivation to continue treatment in a less acute level of care Safe-care adequate arrangements made Verbal commitment to aftercare and medication compliance  PRELIMINARY DISCHARGE PLAN: Placement in alternative living arrangements  PATIENT/FAMIILY INVOLVEMENT: This treatment plan has been presented to and reviewed with the patient, Nathan Santana .The patient and family have been given the opportunity to ask questions and make suggestions.  Elenore Rota 08/17/2015, 5:16 PM

## 2015-08-17 NOTE — Progress Notes (Signed)
Psychoeducational Group Note  Date:  08/17/2015 Time:  2100 Group Topic/Focus:  wrap up group  Participation Level: Did Not Attend  Participation Quality:  Not Applicable  Affect:  Not Applicable  Cognitive:  Not Applicable  Insight:  Not Applicable  Engagement in Group: Not Applicable  Additional Comments:  Pt was notified that group was beginning but remained in bed.   Shellia Cleverly 08/17/2015, 9:31 PM

## 2015-08-17 NOTE — Progress Notes (Signed)
Patient ID: Charmaine Sivers, male   DOB: December 14, 1960, 55 y.o.   MRN: [id number]  55 year old African American male presents to [facility]. Pt reports increased depression, decreased appetite, insomnia and hopelessness since he broke up with his girlfriend and lost his housing recently. Pt has been living alone in his car since that time. Pt stopped taking his medications due to what he describes as "caring and then not caring about things." Previous admits to [facility], last one in November of 2016. Pt reports that he would like to be set up with housing resources and medication management at discharge. Pt states "I want to be balanced. I want to get my medications right. I also want to make sure that I know where I am going and have a set plan when I leave here." Pt endorses passive SI, no plan. Pt currently denies HI and A/V hallucinations. Pt verbally agrees to seek staff if HI or A/VH occurs and to consult with staff before acting on any harmful thoughts. Will continue POC.   Consents signed, skin/belongings search completed and pt oriented to unit. Pt stable at this time. Pt given the opportunity to express concerns and ask questions. Pt given toiletries. Will continue to monitor.

## 2015-08-17 NOTE — ED Notes (Signed)
Splint for left ankle placed in pt's belongings bag d/t unable to have at [facility] d/t strings.

## 2015-08-17 NOTE — Progress Notes (Signed)
Patient ID: Nathan Santana, male   DOB: [birth year], 55 y.o.   MRN: [id number] PER STATE REGULATIONS 482.30  THIS CHART WAS REVIEWED FOR MEDICAL NECESSITY WITH RESPECT TO THE PATIENT'S ADMISSION/DURATION OF STAY.  NEXT REVIEW DATE:08/21/15  Roma Schanz, RN, BSN CASE MANAGER

## 2015-08-18 DIAGNOSIS — F332 Major depressive disorder, recurrent severe without psychotic features: Principal | ICD-10-CM

## 2015-08-18 LAB — HIV-1 RNA QUANT-NO REFLEX-BLD
HIV 1 RNA QUANT: 39100 {copies}/mL
LOG10 HIV-1 RNA: 4.592 log10copy/mL

## 2015-08-18 MED ORDER — ENSURE ENLIVE PO LIQD
237.0000 mL | ORAL | Status: DC
  Administered 2015-08-18 – 2015-08-19 (×2): 237 mL via ORAL

## 2015-08-18 NOTE — BHH Group Notes (Signed)
[facility] LCSW Aftercare Discharge Planning Group Note  08/18/2015 8:45 AM  Participation Quality: Alert, Appropriate and Oriented  Mood/Affect: Appropriate  Depression Rating: 5  Anxiety Rating: 5  Thoughts of Suicide: Pt denies SI/HI  Will you contract for safety? Yes  Current AVH: Pt denies  Plan for Discharge/Comments: Pt attended discharge planning group and actively participated in group. CSW discussed suicide prevention education with the group and encouraged them to discuss discharge planning and any relevant barriers. Pt expressed no concerns this morning and reports that he is feeling "okay."  Transportation Means: Pt reports access to transportation  Supports: No supports mentioned at this time  Nathan Santana, [name] 08/18/2015 9:18 AM

## 2015-08-18 NOTE — BHH Suicide Risk Assessment (Signed)
[facility] Admission Suicide Risk Assessment   Nursing information obtained from:   patient and chart  Demographic factors:   55 year old male, currently homeless  Current Mental Status:   see below  Loss Factors:   homelessness, car broke down recently  Historical Factors:   depression, substance abuse  Risk Reduction Factors:   resilience   Total Time spent with patient: 45 minutes Principal Problem:  MDD  Diagnosis:   Patient Active Problem List   Diagnosis Date Noted  . MDD (major depressive disorder) ([place]) [F32.9] 08/17/2015  . Hyperlipidemia LDL goal <130 [E78.5] 03/01/2015  . Orchalgia [N50.819]   . Testicular pain, right [N50.811]   . HIV (human immunodeficiency virus infection) ([place]) [Z21] 02/05/2015  . CAD (coronary artery disease) [I25.10] 02/05/2015  . Chest pain [R07.9] 02/05/2015  . Suicidal ideations [R45.851] 02/05/2015  . Hypertension [I10] 02/05/2015  . Anxiety [F41.9]   . Pain in the chest [R07.9]   . Depression [F32.9]   . Suicidal ideation [R45.851]   . Severe recurrent major depression without psychotic features ([place]) [F33.2] 01/28/2015  . Major depressive disorder, recurrent, severe without psychotic features ([place]) [F33.2] 01/28/2015    Continued Clinical Symptoms:    The "Alcohol Use Disorders Identification Test", Guidelines for Use in Primary Care, Second Edition.  World Pharmacologist [facility]). Score between 0-7:  no or low risk or alcohol related problems. Score between 8-15:  moderate risk of alcohol related problems. Score between 16-19:  high risk of alcohol related problems. Score 20 or above:  warrants further diagnostic evaluation for alcohol dependence and treatment.   CLINICAL FACTORS:  55 year old male, presented to hospital for worsening depression and passive SI. History of depression and of cocaine abuse . Facing multiple stressors, mainly homelessness, car breaking down, and poor sober / support social  network.    Musculoskeletal: Strength & Muscle Tone: within normal limits Gait & Station: normal Patient leans: N/A  Psychiatric Specialty Exam: Physical Exam  ROS  Blood pressure 140/91, pulse 80, temperature 99.7 F (37.6 C), temperature source Oral, resp. rate 20, height 5\' 9"  (1.753 m), weight 182 lb (82.555 kg).Body mass index is 26.86 kg/(m^2).   see admit note MSE   COGNITIVE FEATURES THAT CONTRIBUTE TO RISK:  No gross cognitive deficits noted Is alert , attentive, and oriented x 3   SUICIDE RISK:   Moderate:  Frequent suicidal ideation with limited intensity, and duration, some specificity in terms of plans, no associated intent, good self-control, limited dysphoria/symptomatology, some risk factors present, and identifiable protective factors, including available and accessible social support.  PLAN OF CARE: Patient will be admitted to inpatient psychiatric unit for stabilization and safety. Will provide and encourage milieu participation. Provide medication management and maked adjustments as needed.  Will follow daily.    I certify that inpatient services furnished can reasonably be expected to improve the patient's condition.   Neita Garnet, MD 08/18/2015, 1:16 PM
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
## 2015-08-18 NOTE — Progress Notes (Signed)
Recreation Therapy Notes  Date: 05.31.2017 Time: 9:30am Location: [address] Group Room   Group Topic: Stress Management  Goal Area(s) Addresses:  Patient will actively participate in stress management techniques presented during session.   Behavioral Response: Did not attend.   Laureen Ochs Ketsia Linebaugh, LRT/CTRS        Natavia Sublette L 08/18/2015 2:05 PM
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
## 2015-08-18 NOTE — Progress Notes (Signed)
Adult Psychoeducational Group Note  Date:  08/18/2015 Time:  9:20 PM  Group Topic/Focus:  Wrap-Up Group:   The focus of this group is to help patients review their daily goal of treatment and discuss progress on daily workbooks.  Participation Level:  Active  Participation Quality:  Appropriate  Affect:  Appropriate  Cognitive:  Alert  Insight: Appropriate  Engagement in Group:  Engaged  Modes of Intervention:  Discussion  Additional Comments:  Patient states, "my day was all right, better than it could have been". Patient stated something positive that happened today was him not getting angry or agitated.   Nathan Santana 08/18/2015, 9:20 PM

## 2015-08-18 NOTE — [facility] Group Notes (Signed)
[place] LCSW Group Therapy 08/18/2015 1:15 PM  Type of Therapy: Group Therapy- Emotion Regulation  Participation Level: Minimal  Participation Quality:  N/A  Affect: Lethargic  Cognitive: Sleeping  Insight:  Unable to Assess  Engagement in Therapy: Limited  Modes of Intervention: Clarification, Confrontation, Discussion, Education, Exploration, Limit-setting, Orientation, Problem-solving, Rapport Building, Art therapist, Socialization and Support  Summary of Progress/Problems: The topic for group today was emotional regulation. This group focused on both positive and negative emotion identification and allowed group members to process ways to identify feelings, regulate negative emotions, and find healthy ways to manage internal/external emotions. Group members were asked to reflect on a time when their reaction to an emotion led to a negative outcome and explored how alternative responses using emotion regulation would have benefited them. Group members were also asked to discuss a time when emotion regulation was utilized when a negative emotion was experienced. Pt slept throughout group discussion.   [name], [place] 08/18/2015 3:56 PM

## 2015-08-18 NOTE — Tx Team (Signed)
Interdisciplinary Treatment Plan Update (Adult) Date: 08/18/2015   Date: 08/18/2015 1:14 PM  Progress in Treatment:  Attending groups: Pt is new to milieu, continuing to assess  Participating in groups: Pt is new to milieu, continuing to assess  Taking medication as prescribed: Yes  Tolerating medication: Yes  Family/Significant othe contact made: No, CSW attempting to make contact with sister  Patient understands diagnosis: Yes AEB seeking help with depression  Discussing patient identified problems/goals with staff: Yes  Medical problems stabilized or resolved: Yes  Denies suicidal/homicidal ideation: Yes Patient has not harmed self or Others: Yes   New problem(s) identified: None identified at this time.   Discharge Plan or Barriers: Pt will discharge to a friend's house and follow-up with outpatient providers  Additional comments:  Patient and CSW reviewed pt's identified goals and treatment plan. Patient verbalized understanding and agreed to treatment plan. CSW reviewed [facility] "Discharge Process and Patient Involvement" Form. Pt verbalized understanding of information provided and signed form.   Reason for Continuation of Hospitalization:  Depression Medication stabilization Suicidal ideation  Estimated length of stay: 3-5 days  Review of initial/current patient goals per problem list:   1.  Goal(s): Patient will participate in aftercare plan  Met:  Yes  Target date: 3-5 days from date of admission   As evidenced by: Patient will participate within aftercare plan AEB aftercare provider and housing plan at discharge being identified.   08/18/15:  Pt will discharge to a friend's house and follow-up with outpatient providers  2.  Goal (s): Patient will exhibit decreased depressive symptoms and suicidal ideations.  Met:  Progressing  Target date: 3-5 days from date of admission   As evidenced by: Patient will utilize self rating of depression at 3 or below and demonstrate  decreased signs of depression or be deemed stable for discharge by MD.  08/18/15: Pt currently rating depression at 5/10; denies SI  Attendees:  Patient:    Family:    Physician: Dr. Parke Poisson, MD  08/18/2015 1:14 PM  Nursing: Lars Pinks, RN Case manager  08/18/2015 1:14 PM  Clinical Social Worker [name], [place] 08/18/2015 1:14 PM  Other: Tilden Fossa, LCSWA 08/18/2015 1:14 PM  Clinical: Loletta Specter RN 08/18/2015 1:14 PM  Other: , RN Charge Nurse 08/18/2015 1:14 PM  Other: Hilda Lias, [place], [place] Work [phone]

## 2015-08-18 NOTE — Progress Notes (Signed)
Patient ID: Nathan Santana, male   DOB: [birth year], 55 y.o.   MRN: [id number] D: Patient's self inventory sheet: patient has fair sleep, received and requested sleep medication.good  Appetite, normal energy level, good concentration. Rated depression 5/10, hopeless 6/10, anxiety 6/10. SI/HI/AVH: denies all. Physical complaints are pain in left ankle that is not being relieved. Goal is "focusing on me now matter what". Plans to work on "do's and don'ts" .   Patient reported feeling agitated this afternoon and angry at himself because he has ankle pain that he would like more aggressive pain management for - "It's my fault that I'm in here, I know that they don't give pain meds like they do on the outside".   A: Medications administered, assessed medication knowledge and education given on medication regimen.  Emotional support and encouragement given patient. Encouraged to elevate ankle and apply cold pack but patient refused.  R: Denies SI and HI , contracts for safety. Safety maintained with 15 minute checks.
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
## 2015-08-18 NOTE — Progress Notes (Signed)
NUTRITION ASSESSMENT  Pt identified as at risk on the Malnutrition Screen Tool  INTERVENTION: 1. Educated patient on the importance of nutrition and encouraged intake of food and beverages. 2. Discussed weight goals. 3. Supplements: will decrease Ensure Enlive to once/day, each supplement provides 350 kcal and 20 grams of protein   NUTRITION DIAGNOSIS: Unintentional weight loss related to sub-optimal intake as evidenced by pt report.   Goal: Pt to meet >/= 90% of their estimated nutrition needs.  Monitor:  PO intake  Assessment:  Pt screened for MST. Pt admitted for depression with many stressors including recent loss of housing. Pt with occasional polysubstance abuse. Pt with hx of HIV but has not been taking his antiviral medications.  Per review, pt has lost 17 lbs (8.5% body weight) in the past 5-6 months which is not significant for time frame. Will decrease Ensure Enlive to once/day.  55 y.o. male  Height: Ht Readings from Last 1 Encounters:  08/17/15 5\' 9"  (1.753 m)    Weight: Wt Readings from Last 1 Encounters:  08/17/15 182 lb (82.555 kg)    Weight Hx: Wt Readings from Last 10 Encounters:  08/17/15 182 lb (82.555 kg)  07/18/15 184 lb 11.2 oz (83.779 kg)  03/01/15 199 lb (90.266 kg)  02/06/15 196 lb 3.4 oz (89 kg)  02/02/15 190 lb (86.183 kg)    BMI:  Body mass index is 26.86 kg/(m^2). Pt meets criteria for overweight based on current BMI.  Estimated Nutritional Needs: Kcal: 25-30 kcal/kg Protein: > 1 gram protein/kg Fluid: 1 ml/kcal  Diet Order: Diet regular Room service appropriate?: Yes; Fluid consistency:: Thin Pt is also offered choice of unit snacks mid-morning and mid-afternoon.  Pt is eating as desired.   Lab results and medications reviewed.     Jarome Matin, RD, LDN Inpatient Clinical Dietitian Pager # [phone] After hours/weekend pager # [phone]

## 2015-08-19 DIAGNOSIS — F332 Major depressive disorder, recurrent severe without psychotic features: Secondary | ICD-10-CM

## 2015-08-19 MED ORDER — ZOLPIDEM TARTRATE 5 MG PO TABS
5.0000 mg | ORAL_TABLET | Freq: Every day | ORAL | Status: DC
  Administered 2015-08-19: 5 mg via ORAL
  Filled 2015-08-19 (×2): qty 1

## 2015-08-19 MED ORDER — IBUPROFEN 400 MG PO TABS
400.0000 mg | ORAL_TABLET | Freq: Four times a day (QID) | ORAL | Status: DC | PRN
  Administered 2015-08-20: 400 mg via ORAL
  Filled 2015-08-19: qty 1

## 2015-08-19 MED ORDER — NITROGLYCERIN 0.4 MG SL SUBL
0.4000 mg | SUBLINGUAL_TABLET | SUBLINGUAL | Status: DC | PRN
  Administered 2015-08-19: 0.4 mg via SUBLINGUAL
  Filled 2015-08-19: qty 1

## 2015-08-19 NOTE — Progress Notes (Signed)
[facility] MD Progress Note  08/19/2015 6:35 PM Nathan Santana  MRN:  [id number] Subjective:   Patient reports partial improvement , and denies suicidal ideations .  Denies medication side effects. Presents vaguely irritable, and states he feels " closed in " being on inpatient setting and hoping to discharge soon. He does , however, acknowledge he is  Experiencing some cravings, thoughts of cocaine use.  Objective : I have discussed case with treatment team and have met with patient. Patient reports feeling better, and states he is not as severely depressed. He does present vaguely depressed, irritable, although behavior in good control. Denies medication side effects, but does state that Trazodone, although effective for sleep, causes excessive sense of drowsiness and sluggishness the next morning, and wants to change this medication. States he has been on Ambien in the past, with no side effects or issues . We discussed potential disposition planning , and I encouraged him to consider going to a residential setting or rehab , if possible, in order to continue working on early recovery and relapse prevention . Reports ankle pain, discomfort and requests opiate analgesic- of note, does not appear in any acute distress,discomfort,and gait steady, no limping noted . We reviewed rationale to minimize /avoid use of opiates for this type of pain, and patient agreed to NSAID PRN ( Ibuprofen) . Visible on unit, has been attending some groups, limited participation in milieu at this time. No disruptive or agitated behaviors .   Principal Problem: MDD (major depressive disorder) ([place]) Diagnosis:   Patient Active Problem List   Diagnosis Date Noted  . MDD (major depressive disorder) ([facility]) [F32.9] 08/17/2015  . Hyperlipidemia LDL goal <130 [E78.5] 03/01/2015  . Orchalgia [N50.819]   . Testicular pain, right [N50.811]   . HIV (human immunodeficiency virus infection) ([place]) [Z21] 02/05/2015  . CAD (coronary  artery disease) [I25.10] 02/05/2015  . Chest pain [R07.9] 02/05/2015  . Suicidal ideations [R45.851] 02/05/2015  . Hypertension [I10] 02/05/2015  . Anxiety [F41.9]   . Pain in the chest [R07.9]   . Depression [F32.9]   . Suicidal ideation [R45.851]   . Severe recurrent major depression without psychotic features ([place]) [F33.2] 01/28/2015  . Major depressive disorder, recurrent, severe without psychotic features ([place]) [F33.2] 01/28/2015   Total Time spent with patient: 20 minutes    Past Medical History:  Past Medical History  Diagnosis Date  . HIV infection ([place])   . Cancer ([place])   . Hypertension   . Angina pectoris ([place])   . Scrotal cyst   . Anxiety   . Depression   . Hypercholesteremia     Past Surgical History  Procedure Laterality Date  . Scrotal turmor removed    . [place]      cancer tumor removed in 2006    Family History:  Family History  Problem Relation Age of Onset  . Hypertension Other   . CAD Sister     Social History:  History  Alcohol Use No    Comment: one beer weekly     History  Drug Use  . Yes  . Special: Marijuana, Cocaine    Comment: Cocaine    Social History   Social History  . Marital Status: Single    Spouse Name: N/A  . Number of Children: 4  . Years of Education: N/A   Social History Main Topics  . Smoking status: Current Some Day Smoker -- 0.50 packs/day    Types: Cigars, Cigarettes  . Smokeless tobacco: None  .  Alcohol Use: No     Comment: one beer weekly  . Drug Use: Yes    Special: Marijuana, Cocaine     Comment: Cocaine  . Sexual Activity: No   Other Topics Concern  . None   Social History Narrative   Additional Social History:    Pain Medications: aleve Prescriptions: See PTA list Over the Counter: advil History of alcohol / drug use?: Yes Negative Consequences of Use: Financial, Legal, Personal relationships, Work / School Withdrawal Symptoms: Other (Comment) (depression, anxiety) Name of Substance 1:  Alcohol 1 - Age of First Use: 13 1 - Amount (size/oz): "a couple of beers" 1 - Frequency: every other day 1 - Duration: years 1 - Last Use / Amount: a few days ago Name of Substance 2: Marijuana 2 - Age of First Use: 12 or 13 2 - Amount (size/oz): "a dime bag" 2 - Frequency: daily 2 - Duration: ongoing; cut back since Nov 2016 2 - Last Use / Amount: a few days ago Name of Substance 3: Cocaine 3 - Age of First Use: 22s 3 - Amount (size/oz): binges 3 - Frequency: pt sts he binges every few weeks 3 - Duration: ongoing 3 - Last Use / Amount: 5 days ago Name of Substance 4: Nicotine/Cigarettes 4 - Age of First Use: 36s 4 - Amount (size/oz): 1 or 2 4 - Duration: quit for a few years; started again in Nov 2016 4 - Last Use / Amount: today            Sleep: improved - states too sedated in AM, related to Trazodone   Appetite:  improved   Current Medications: Current Facility-Administered Medications  Medication Dose Route Frequency Provider Last Rate Last Dose  . acetaminophen (TYLENOL) tablet 650 mg  650 mg Oral Q6H PRN Kerrie Buffalo, NP   650 mg at 08/18/15 1610  . alum & mag hydroxide-simeth (MAALOX/MYLANTA) 200-200-20 MG/5ML suspension 30 mL  30 mL Oral Q4H PRN Kerrie Buffalo, NP      . amLODipine (NORVASC) tablet 10 mg  10 mg Oral Daily Kerrie Buffalo, NP   10 mg at 08/19/15 0819  . aspirin EC tablet 81 mg  81 mg Oral Daily Kerrie Buffalo, NP   81 mg at 08/19/15 0819  . efavirenz-emtricitabine-tenofovir (ATRIPLA) 600-200-300 MG per tablet 1 tablet  1 tablet Oral QHS Kerrie Buffalo, NP   1 tablet at 08/18/15 2155  . feeding supplement (ENSURE ENLIVE) (ENSURE ENLIVE) liquid 237 mL  237 mL Oral Q24H Myer Peer Saharsh Sterling, MD   237 mL at 08/19/15 1630  . fluticasone (FLONASE) 50 MCG/ACT nasal spray 1 spray  1 spray Each Nare BID Kerrie Buffalo, NP   1 spray at 08/18/15 0856  . ibuprofen (ADVIL,MOTRIN) tablet 400 mg  400 mg Oral Q6H PRN Myer Peer Lewi Drost, MD      . lisinopril  (PRINIVIL,ZESTRIL) tablet 10 mg  10 mg Oral Daily Kerrie Buffalo, NP   10 mg at 08/19/15 0819  . magnesium hydroxide (MILK OF MAGNESIA) suspension 30 mL  30 mL Oral Daily PRN Kerrie Buffalo, NP      . nicotine polacrilex (NICORETTE) gum 2 mg  2 mg Oral PRN Jenne Campus, MD      . nitroGLYCERIN (NITROSTAT) SL tablet 0.4 mg  0.4 mg Sublingual Q5 min PRN Jenne Campus, MD      . pantoprazole (PROTONIX) EC tablet 40 mg  40 mg Oral Daily Kerrie Buffalo, NP   40 mg at 08/19/15  8811  . PARoxetine (PAXIL) tablet 20 mg  20 mg Oral Sharyon Medicus, NP   20 mg at 08/19/15 0315  . rosuvastatin (CRESTOR) tablet 20 mg  20 mg Oral Daily Kerrie Buffalo, NP   20 mg at 08/19/15 0819  . zolpidem (AMBIEN) tablet 5 mg  5 mg Oral QHS Jenne Campus, MD        Lab Results: No results found for this or any previous visit (from the past 48 hour(s)).  Blood Alcohol level:  Lab Results  Component Value Date   ETH <5 08/16/2015   ETH <5 02/05/2015    Physical Findings: AIMS:  , ,  ,  ,    CIWA:  CIWA-Ar Total: 3 COWS:  COWS Total Score: 2  Musculoskeletal: Strength & Muscle Tone: within normal limits Gait & Station: normal Patient leans: N/A  Psychiatric Specialty Exam: Physical Exam  ROS no chest pain, no shortness of breath at room air, no nausea, no vomiting, describes some ankle pain today  Blood pressure 137/100, pulse 82, temperature 98.6 F (37 C), temperature source Oral, resp. rate 18, height _0  (1.753 m), weight 182 lb (82.555 kg).Body mass index is 26.86 kg/(m^2).  General Appearance: Fairly Groomed  Eye Contact:  Good  Speech:  Normal Rate  Volume:  Normal  Mood:  improving, vaguely irritable   Affect:  Appropriate and reactive, subtly irritable at times   Thought Process:  Linear  Orientation:  Full (Time, Place, and Person)  Thought Content:  No hallucinations,no delusions    Suicidal Thoughts:  No- denies suicidal ideations, no self injurious ideations   Homicidal  Thoughts:  No - denies any homicidal or violent ideations   Memory:  recent and remote grossly intact   Judgement:  Fair  Insight:  Fair  Psychomotor Activity:  Normal  Concentration:  Concentration: Good and Attention Span: Good  Recall:  Good  Fund of Knowledge:  Good  Language:  Good  Akathisia:  Negative  Handed:  Right  AIMS (if indicated):     Assets:  Communication Skills Desire for Improvement Resilience  ADL's:  Intact  Cognition:  WNL  Sleep:  Number of Hours: 6.75  Assessment - patient partially improved, describes improving mood, less depression, denies any SI at this time. Presents vaguely , subtly irritable, and medication seeking, requesting opiate analgesic management for ankle pain. Acknowledges some cravings for cocaine, but states he is motivated in sobriety. I have encouraged him to consider going to a residential /sober setting on discharge if possible.  Currently tolerating medications well , but wants to D/C Trazodone due to excessive AM sedation.   Treatment Plan Summary: Daily contact with patient to assess and evaluate symptoms and progress in treatment, Medication management, Plan inpatient treatment and medications as below Encourage ongoing group , milieu participation to work on coping skills and symptom reduction Encourage ongoing efforts /focus on recovery, relapse prevention  Continue Atripla for management of HIV Continue Paxil 20 mgrs QDAY for depression, anxiety Start Ibuprofen PRNs for pain, as needed  Continue Protonix to minimize GERD, reflux symptoms D/C Trazodone PRNs Start Ambien 5 mgrs QHS PRN for insomnia Neita Garnet, MD 08/19/2015, 6:35 PM

## 2015-08-19 NOTE — Progress Notes (Signed)
Patient ID: Nathan Santana, male   DOB: 20-May-1960, 55 y.o.   MRN: [id number] D: Patient observed watching TV and interacting well with peers. Denies SI/HI/AVH and pain.No behavioral issues noted.  A: Support and encouragement offered as needed. Medications administered as prescribed.  R: Patient cooperative and appropriate on unit. Will continue to monitor patient for safety and stability.

## 2015-08-19 NOTE — [facility] Group Notes (Addendum)
[facility] LCSW Group Therapy 08/19/2015  1:15 PM   Type of Therapy: Group Therapy  Participation Level: Did Not Attend. Patient invited to participate but declined.   Tilden Fossa, MSW, [place] Clinical Social Worker [facility] [phone]

## 2015-08-19 NOTE — Progress Notes (Addendum)
Patient ID: Nathan Santana, male   DOB: 01-19-1961, 55 y.o.   MRN: [id number]   Pt currently presents with a masked affect and depressed behavior. Per self inventory, pt rates depression at a 3, hopelessness 4  and anxiety 5. Pt's daily goal is to "going home" and they intend to do so by "talk to doc." Pt reports good sleep, a good appetite, high energy and good concentration. Pt reports being tired after taking sleep medication, Trazdone. States "I'm not taking that again." Pt also requests to be put on "real pain medication and know how to take it."  Pt provided with medications per providers orders. Pt's labs and vitals were monitored throughout the day. Pt supported emotionally and encouraged to express concerns and questions. Pt educated on medications.  Pt's safety ensured with 15 minute and environmental checks. Pt currently denies SI/HI and A/V hallucinations. Pt verbally agrees to seek staff if SI/HI or A/VH occurs and to consult with staff before acting on any harmful thoughts. Pt reports he wishes to be discharged tomorrow to a continuing program that will ultimately lead to housing opportunities.  Will continue POC.

## 2015-08-20 MED ORDER — EFAVIRENZ-EMTRICITAB-TENOFOVIR 600-200-300 MG PO TABS: 1.0000 | ORAL_TABLET | Freq: Every day | ORAL | Status: DC

## 2015-08-20 MED ORDER — FLUTICASONE PROPIONATE 50 MCG/ACT NA SUSP: 1.0000 | Freq: Two times a day (BID) | NASAL | Status: DC

## 2015-08-20 MED ORDER — AMLODIPINE BESYLATE 10 MG PO TABS: 10.0000 mg | ORAL_TABLET | Freq: Every day | ORAL | Status: DC

## 2015-08-20 MED ORDER — ROSUVASTATIN CALCIUM 20 MG PO TABS: 20.0000 mg | ORAL_TABLET | Freq: Every day | ORAL | Status: DC

## 2015-08-20 MED ORDER — LISINOPRIL 10 MG PO TABS: 10.0000 mg | ORAL_TABLET | Freq: Every day | ORAL | Status: DC

## 2015-08-20 MED ORDER — PANTOPRAZOLE SODIUM 40 MG PO TBEC: 40.0000 mg | DELAYED_RELEASE_TABLET | Freq: Every day | ORAL | Status: DC

## 2015-08-20 MED ORDER — NICOTINE POLACRILEX 2 MG MT GUM: 2.0000 mg | CHEWING_GUM | OROMUCOSAL | Status: DC | PRN

## 2015-08-20 MED ORDER — NITROGLYCERIN 0.4 MG SL SUBL: 0.4000 mg | SUBLINGUAL_TABLET | SUBLINGUAL | Status: DC | PRN

## 2015-08-20 MED ORDER — PAROXETINE HCL 20 MG PO TABS: 20.0000 mg | ORAL_TABLET | ORAL | Status: DC

## 2015-08-20 MED ORDER — ASPIRIN EC 81 MG PO TBEC: 81.0000 mg | DELAYED_RELEASE_TABLET | Freq: Every day | ORAL | Status: DC

## 2015-08-20 MED ORDER — ZOLPIDEM TARTRATE 5 MG PO TABS: 5.0000 mg | ORAL_TABLET | Freq: Every day | ORAL | Status: DC

## 2015-08-20 NOTE — Progress Notes (Signed)
  [facility] Adult Case Management Discharge Plan :  Will you be returning to the same living situation after discharge:  Yes,  Pt returning to motel At discharge, do you have transportation home?: Yes,  Pt provided with ride to [place] where his truck is located Do you have the ability to pay for your medications: Yes,  Pt provided with prescriptions  Release of information consent forms completed and in the chart;  Patient's signature needed at discharge.  Patient to Follow up at: Follow-up Information    Follow up with [facility] On 09/15/2015.   Why:  Medication management appt on Wednesday June 28th at 10am. Please bring insurance cards and call office if you need to reschedule.    Contact information:   [address] [place], [place] [address] [phone]      Next level of care provider has access to [facility] and Suicide Prevention discussed: Yes,  with Pt; 2 unsuccessful attempts with sister  Have you used any form of tobacco in the last 30 days? (Cigarettes, Smokeless Tobacco, Cigars, and/or Pipes): Yes  Has patient been referred to the Quitline?: Patient refused referral  Patient has been referred for addiction treatment: Yes  [name] M 08/20/2015, 10:09 AM

## 2015-08-20 NOTE — Progress Notes (Signed)
Nathan Santana has bee up and visible on the unit.  He completed his self inventory and reports that his depression and anxiety are 3/10.  His goal for today is to be "sure of what I am doing."  Pt. D/C from the unit to lobby to be picked up by security and driven to [facility] where his car is parked.  He was pleasant and cooperative. Voiced no SI/HI or A/V halluciations.  Pt. Denies any pain or discomfort.  D/C instructions and medications reviewed with pt.  Pt. verbalized understanding of medications and d/c instructions.   All belongings from locker 32 returned to pt. Q 15 min checks maintained until discharge.  Pt. Left the unit in no apparent distress.

## 2015-08-20 NOTE — Progress Notes (Signed)
Patient ID: Nathan Santana, male   DOB: 10/17/1960, 55 y.o.   MRN: [id number] D: Patient in c/o chest pain radiating to left shoulder. Pt rated pain as 7 on a 0-10 scale. Nitrostat administered, pain decreased to 5 after 5 mins. Vital signs WNL. Pt anticipating discharge tomorrow unsure where he will live. Pt reports tolerating medication well. Pt denies SI/HI/AVH. Pt attended and participated in evening karaoke group. Cooperative with assessment. No acute physical distress noted.  A:  Medications administered as prescribed. Support and encouragement provided as needed. R: Patient remains safe and complaint with medications.

## 2015-08-20 NOTE — Discharge Summary (Signed)
Physician Discharge Summary Note  Patient:  Nathan Santana is an 55 y.o., male MRN:  [id number] DOB:  [birth year] Patient phone:  [phone] (home)  Patient address:   [address] [place] [address],  Total Time spent with patient: 30 minutes  Date of Admission:  08/17/2015 Date of Discharge: 08/20/2015  Reason for Admission:  Worsening depression  Principal Problem: Severe episode of recurrent major depressive disorder, without psychotic features [facility]) Discharge Diagnoses: Patient Active Problem List   Diagnosis Date Noted  . Severe episode of recurrent major depressive disorder, without psychotic features ([place]) [F33.2]   . Hyperlipidemia LDL goal <130 [E78.5] 03/01/2015  . Orchalgia [N50.819]   . Testicular pain, right [N50.811]   . HIV (human immunodeficiency virus infection) ([place]) [Z21] 02/05/2015  . CAD (coronary artery disease) [I25.10] 02/05/2015  . Chest pain [R07.9] 02/05/2015  . Suicidal ideations [R45.851] 02/05/2015  . Hypertension [I10] 02/05/2015  . Anxiety [F41.9]   . Pain in the chest [R07.9]   . Depression [F32.9]   . Suicidal ideation [R45.851]     Past Psychiatric History:  SEE HPI  Past Medical History:  Past Medical History  Diagnosis Date  . HIV infection ([place])   . Cancer ([place])   . Hypertension   . Angina pectoris ([place])   . Scrotal cyst   . Anxiety   . Depression   . Hypercholesteremia     Past Surgical History  Procedure Laterality Date  . Scrotal turmor removed    . Caner      cancer tumor removed in 2006    Family History:  Family History  Problem Relation Age of Onset  . Hypertension Other   . CAD Sister    Family Psychiatric  History:  SEE HPI Social History:  History  Alcohol Use No    Comment: one beer weekly     History  Drug Use  . Yes  . Special: Marijuana, Cocaine    Comment: Cocaine    Social History   Social History  . Marital Status: Single    Spouse Name: N/A  . Number of Children: 4  . Years  of Education: N/A   Social History Main Topics  . Smoking status: Current Some Day Smoker -- 0.50 packs/day    Types: Cigars, Cigarettes  . Smokeless tobacco: None  . Alcohol Use: No     Comment: one beer weekly  . Drug Use: Yes    Special: Marijuana, Cocaine     Comment: Cocaine  . Sexual Activity: No   Other Topics Concern  . None   Social History Narrative    Hospital Course:   Nathan Santana is a 55 year old male, known to our service from prior psychiatric admissions.  He has a history of mood disorder, depression, and a history of substance abuse. He reported he has been struggling with increased depressive symptoms, in the context of chronic and ongoing psycho-social stressors.    Nathan Santana was admitted for Severe episode of recurrent major depressive disorder, without psychotic features ([place]) and crisis management.  He was treated with medications and their indications listed below.  Medical problems were identified and treated as needed.  Home medications were restarted as appropriate.  Improvement was monitored by observation and Nathan Santana daily report of symptom reduction.  Emotional and mental status was monitored by daily self inventory reports completed by Nathan Santana and clinical staff.  Patient reported continued improvement, denied any new concerns.  Patient had been  compliant on medications and denied side effects.  Support and encouragement was provided.    Patient did well during inpatient stay.  At time of discharge, patient rated both depression and anxiety levels to be manageable and minimal.  Patient encouraged to attend groups to help with recognizing triggers of emotional crises and de-stabilizations.  Patient encouraged to attend group to help identify the positive things in life that would help in dealing with feelings of loss, depression and unhealthy or abusive tendencies.         Nathan Santana was evaluated by the  treatment team for stability and plans for continued recovery upon discharge.  He was offered further treatment options upon discharge including Residential, Intensive Outpatient and Outpatient treatment.  He will follow up with agencies listed below for medication management and counseling.  Encouraged patient to maintain satisfactory support network and home environment.  Advised to adhere to medication compliance and outpatient treatment follow up.      Nathan Santana motivation was an integral factor for scheduling further treatment.  Employment, transportation, bed availability, health status, family support, and any pending legal issues were also considered during his hospital stay.  Upon completion of this admission the patient was both mentally and medically stable for discharge denying suicidal/homicidal ideation, auditory/visual/tactile hallucinations, delusional thoughts and paranoia.       Physical Findings: AIMS:  , ,  ,  ,    CIWA:  CIWA-Ar Total: 3 COWS:  COWS Total Score: 2  Musculoskeletal: Strength & Muscle Tone: within normal limits Gait & Station: normal Patient leans: N/A  Psychiatric Specialty Exam:  SEE MD SRA Physical Exam  Vitals reviewed. Psychiatric: His affect is not angry. He expresses no homicidal and no suicidal ideation.    Review of Systems  Psychiatric/Behavioral: Negative for depression, suicidal ideas and hallucinations.    Blood pressure 146/99, pulse 89, temperature 98.9 F (37.2 C), temperature source Oral, resp. rate 24, height 5\' 9"  (1.753 m), weight 82.555 kg (182 lb), SpO2 100 %.Body mass index is 26.86 kg/(m^2).    Have you used any form of tobacco in the last 30 days? (Cigarettes, Smokeless Tobacco, Cigars, and/or Pipes): Yes  Has this patient used any form of tobacco in the last 30 days? (Cigarettes, Smokeless Tobacco, Cigars, and/or Pipes) Yes, given to patient  Blood Alcohol level:  Lab Results  Component Value Date   [facility] <5  08/16/2015   ETH <5 [id number]    Metabolic Disorder Labs:  Lab Results  Component Value Date   HGBA1C 5.3 02/06/2015   MPG 105 02/06/2015   MPG 105 01/30/2015   No results found for: PROLACTIN Lab Results  Component Value Date   CHOL 156 03/01/2015   TRIG 60 03/01/2015   HDL 51 03/01/2015   CHOLHDL 3.1 03/01/2015   VLDL 12 03/01/2015   LDLCALC 93 03/01/2015   [place] 95 02/06/2015    See Psychiatric Specialty Exam and Suicide Risk Assessment completed by Attending Physician prior to discharge.  Discharge destination:  Home  Is patient on multiple antipsychotic therapies at discharge:  No   Has Patient had three or more failed trials of antipsychotic monotherapy by history:  No  Recommended Plan for Multiple Antipsychotic Therapies: NA     Medication List    STOP taking these medications        carvedilol 25 MG tablet  Commonly known as:  COREG     hydrocortisone cream 1 %      TAKE these medications  Indication   amLODipine 10 MG tablet  Commonly known as:  NORVASC  Take 1 tablet (10 mg total) by mouth daily.   Indication:  High Blood Pressure     aspirin EC 81 MG tablet  Take 1 tablet (81 mg total) by mouth daily.   Indication:  cardiac health     efavirenz-emtricitabine-tenofovir 600-200-300 MG tablet  Commonly known as:  ATRIPLA  Take 1 tablet by mouth at bedtime.   Indication:  HIV Disease     fluticasone 50 MCG/ACT nasal spray  Commonly known as:  FLONASE  Place 1 spray into both nostrils 2 (two) times daily.   Indication:  Signs and Symptoms of Nose Diseases     lisinopril 10 MG tablet  Commonly known as:  PRINIVIL,ZESTRIL  Take 1 tablet (10 mg total) by mouth daily.   Indication:  High Blood Pressure     nicotine polacrilex 2 MG gum  Commonly known as:  NICORETTE  Take 1 each (2 mg total) by mouth as needed for smoking cessation.   Indication:  Nicotine Addiction     nitroGLYCERIN 0.4 MG SL tablet  Commonly known as:  NITROSTAT   Place 1 tablet (0.4 mg total) under the tongue every 5 (five) minutes as needed for chest pain.   Indication:  Acute Angina Pectoris     pantoprazole 40 MG tablet  Commonly known as:  PROTONIX  Take 1 tablet (40 mg total) by mouth daily.   Indication:  Gastroesophageal Reflux Disease     PARoxetine 20 MG tablet  Commonly known as:  PAXIL  Take 1 tablet (20 mg total) by mouth every morning.   Indication:  Generalized Anxiety Disorder, Major Depressive Disorder     rosuvastatin 20 MG tablet  Commonly known as:  CRESTOR  Take 1 tablet (20 mg total) by mouth daily.   Indication:  Disease of the Heart and Blood Vessels     zolpidem 5 MG tablet  Commonly known as:  AMBIEN  Take 1 tablet (5 mg total) by mouth at bedtime.   Indication:  Trouble Sleeping           Follow-up Information    Follow up with [facility] On 09/15/2015.   Why:  Medication management appt on Wednesday June 28th at 10am. Please bring insurance cards and call office if you need to reschedule.    Contact information:   [address] [place], [place] [address] [phone]      Follow-up recommendations:  Activity:  as tol Diet:  as tol  Comments:  1.  Take all your medications as prescribed.   2.  Report any adverse side effects to outpatient provider. 3.  Patient instructed to not use alcohol or illegal drugs while on prescription medicines. 4.  In the event of worsening symptoms, instructed patient to call 911, the crisis hotline or go to nearest emergency room for evaluation of symptoms.  Signed: Janett Labella, NP [facility] 08/20/2015, 10:01 AM

## 2015-08-20 NOTE — Tx Team (Signed)
Interdisciplinary Treatment Plan Update (Adult) Date: 08/20/2015   Date: 08/20/2015 10:07 AM  Progress in Treatment:  Attending groups: Yes  Participating in groups: Yes Taking medication as prescribed: Yes  Tolerating medication: Yes  Family/Significant othe contact made: No, CSW attempting to make contact with sister  Patient understands diagnosis: Yes AEB seeking help with depression  Discussing patient identified problems/goals with staff: Yes  Medical problems stabilized or resolved: Yes  Denies suicidal/homicidal ideation: Yes Patient has not harmed self or Others: Yes   New problem(s) identified: None identified at this time.   Discharge Plan or Barriers: Pt will discharge to a friend's house and follow-up with outpatient providers  08/20/2015: Pt will discharge to motel and follow-up with outpatient providers  Additional comments:  Patient and CSW reviewed pt's identified goals and treatment plan. Patient verbalized understanding and agreed to treatment plan. CSW reviewed [facility] "Discharge Process and Patient Involvement" Form. Pt verbalized understanding of information provided and signed form.   Reason for Continuation of Hospitalization:  Depression Medication stabilization Suicidal ideation  Estimated length of stay: 0 days  Review of initial/current patient goals per problem list:   1.  Goal(s): Patient will participate in aftercare plan  Met:  Yes  Target date: 3-5 days from date of admission   As evidenced by: Patient will participate within aftercare plan AEB aftercare provider and housing plan at discharge being identified.   08/18/15:  Pt will discharge to a friend's house and follow-up with outpatient providers  2.  Goal (s): Patient will exhibit decreased depressive symptoms and suicidal ideations.  Met:  Yes  Target date: 3-5 days from date of admission   As evidenced by: Patient will utilize self rating of depression at 3 or below and demonstrate  decreased signs of depression or be deemed stable for discharge by MD.  08/18/15: Pt currently rating depression at 5/10; denies SI 08/20/2015: Pt rates depression at 0/10; denies SI  Attendees:  Patient:    Family:    Physician: Dr. Shea Evans, MD  08/20/2015 10:07 AM  Nursing: Lars Pinks, RN Case manager  08/20/2015 10:07 AM  Clinical Social Worker [name], [place] 08/20/2015 10:07 AM  Other: ,  08/20/2015 10:07 AM  Clinical:Jane Ochieng RN 08/20/2015 10:07 AM  Other: , RN Charge Nurse 08/20/2015 10:07 AM  Other: Hilda Lias, [place], [place] Work [phone]

## 2015-08-20 NOTE — BHH Suicide Risk Assessment (Signed)
[facility] INPATIENT:  Family/Significant Other Suicide Prevention Education  Suicide Prevention Education:  Contact Attempts: Corneilus Salah, Pt's sister [phone], has been identified by the patient as the family member/significant other with whom the patient will be residing, and identified as the person(s) who will aid the patient in the event of a mental health crisis.  With written consent from the patient, two attempts were made to provide suicide prevention education, prior to and/or following the patient's discharge.  We were unsuccessful in providing suicide prevention education.  A suicide education pamphlet was given to the patient to share with family/significant other.  Date and time of first attempt: 08/19/15 @ 08:45am Date and time of second attempt: 08/19/15 @ 01:15pm  Bo Mcclintock 08/20/2015, 10:12 AM

## 2015-08-20 NOTE — BHH Suicide Risk Assessment (Signed)
[facility] Discharge Suicide Risk Assessment   Principal Problem: Severe episode of recurrent major depressive disorder, without psychotic features [facility]) Discharge Diagnoses:  Patient Active Problem List   Diagnosis Date Noted  . Severe episode of recurrent major depressive disorder, without psychotic features ([place]) [F33.2]   . Hyperlipidemia LDL goal <130 [E78.5] 03/01/2015  . Orchalgia [N50.819]   . Testicular pain, right [N50.811]   . HIV (human immunodeficiency virus infection) ([place]) [Z21] 02/05/2015  . CAD (coronary artery disease) [I25.10] 02/05/2015  . Chest pain [R07.9] 02/05/2015  . Suicidal ideations [R45.851] 02/05/2015  . Hypertension [I10] 02/05/2015  . Anxiety [F41.9]   . Pain in the chest [R07.9]   . Depression [F32.9]   . Suicidal ideation [R45.851]     Total Time spent with patient: 30 minutes  Musculoskeletal: Strength & Muscle Tone: within normal limits Gait & Station: normal Patient leans: N/A  Psychiatric Specialty Exam: Review of Systems  Psychiatric/Behavioral: Positive for substance abuse. Negative for depression and suicidal ideas.  All other systems reviewed and are negative.   Blood pressure 146/99, pulse 89, temperature 98.9 F (37.2 C), temperature source Oral, resp. rate 24, height 5\' 9"  (1.753 m), weight 82.555 kg (182 lb), SpO2 100 %.Body mass index is 26.86 kg/(m^2).  General Appearance: Casual  Eye Contact::  Fair  Speech:  Clear and [id number]  Volume:  Normal  Mood:  Euthymic  Affect:  Appropriate  Thought Process:  Goal Directed  Orientation:  Full (Time, Place, and Person)  Thought Content:  Logical  Suicidal Thoughts:  No  Homicidal Thoughts:  No  Memory:  Immediate;   Fair Recent;   Fair Remote;   Fair  Judgement:  Fair  Insight:  Fair  Psychomotor Activity:  Normal  Concentration:  Fair  Recall:  [employer] of Knowledge:Fair  Language: Fair  Akathisia:  No  Handed:  Right  AIMS (if indicated):     Assets:  Desire for  Improvement  Sleep:  Number of Hours: 6.5  Cognition: WNL  ADL's:  Intact   Mental Status Per Nursing Assessment::   On Admission:     Demographic Factors:  Male  Loss Factors: NA  Historical Factors: Impulsivity  Risk Reduction Factors:   Positive social support  Continued Clinical Symptoms:  Alcohol/Substance Abuse/Dependencies Previous Psychiatric Diagnoses and Treatments Medical Diagnoses and Treatments/Surgeries  Cognitive Features That Contribute To Risk:  None    Suicide Risk:  Minimal: No identifiable suicidal ideation.  Patients presenting with no risk factors but with morbid ruminations; may be classified as minimal risk based on the severity of the depressive symptoms  Follow-up Information    Follow up with [facility] On 09/15/2015.   Why:  Medication management appt on Wednesday June 28th at 10am. Please bring insurance cards and call office if you need to reschedule.    Contact information:   [address] [place], [place] [address] [phone]      Plan Of Care/Follow-up recommendations:  Activity:  no restrictions Diet:  regular Tests:  as needed Other:  follow up with aftercare  Athira Janowicz, MD 08/20/2015, 10:01 AM

## 2015-08-20 NOTE — Progress Notes (Signed)
CSW spoke with Pt about the reports from RN that his girlfriend called stating that he was threatening her. Pt expressly denies that he was threatening his ex-girlfriend and reports that he was talking about "karma", describing that he feels that all the negative things she has done to him will "come back to her." He reports that he would "never" harm someone.   [name], [place] Work [phone]

## 2015-08-22 ENCOUNTER — Emergency Department (HOSPITAL_COMMUNITY): Payer: Medicare Other

## 2015-08-22 ENCOUNTER — Emergency Department (HOSPITAL_COMMUNITY)
Admission: EM | Admit: 2015-08-22 | Discharge: 2015-08-22 | Disposition: A | Discharge status: Home / Self Care | Payer: Medicare Other | Attending: Emergency Medicine | Admitting: Emergency Medicine

## 2015-08-22 ENCOUNTER — Encounter (HOSPITAL_COMMUNITY): Payer: Self-pay

## 2015-08-22 DIAGNOSIS — F17210 Nicotine dependence, cigarettes, uncomplicated: Secondary | ICD-10-CM | POA: Diagnosis not present

## 2015-08-22 DIAGNOSIS — I209 Angina pectoris, unspecified: Secondary | ICD-10-CM | POA: Diagnosis not present

## 2015-08-22 DIAGNOSIS — Z872 Personal history of diseases of the skin and subcutaneous tissue: Secondary | ICD-10-CM | POA: Insufficient documentation

## 2015-08-22 DIAGNOSIS — F329 Major depressive disorder, single episode, unspecified: Secondary | ICD-10-CM | POA: Insufficient documentation

## 2015-08-22 DIAGNOSIS — Z7951 Long term (current) use of inhaled steroids: Secondary | ICD-10-CM | POA: Insufficient documentation

## 2015-08-22 DIAGNOSIS — F419 Anxiety disorder, unspecified: Secondary | ICD-10-CM | POA: Insufficient documentation

## 2015-08-22 DIAGNOSIS — E7800 Pure hypercholesterolemia, unspecified: Secondary | ICD-10-CM | POA: Insufficient documentation

## 2015-08-22 DIAGNOSIS — Z79899 Other long term (current) drug therapy: Secondary | ICD-10-CM | POA: Diagnosis not present

## 2015-08-22 DIAGNOSIS — B20 Human immunodeficiency virus [HIV] disease: Secondary | ICD-10-CM | POA: Insufficient documentation

## 2015-08-22 DIAGNOSIS — Z7982 Long term (current) use of aspirin: Secondary | ICD-10-CM | POA: Insufficient documentation

## 2015-08-22 DIAGNOSIS — I10 Essential (primary) hypertension: Secondary | ICD-10-CM | POA: Diagnosis not present

## 2015-08-22 DIAGNOSIS — M25572 Pain in left ankle and joints of left foot: Secondary | ICD-10-CM | POA: Diagnosis present

## 2015-08-22 DIAGNOSIS — M25472 Effusion, left ankle: Secondary | ICD-10-CM | POA: Insufficient documentation

## 2015-08-22 LAB — CBC WITH DIFFERENTIAL/PLATELET
BASOS ABS: 0 10*3/uL (ref 0.0–0.1)
Basophils Relative: 0 %
EOS ABS: 0.2 10*3/uL (ref 0.0–0.7)
EOS PCT: 4 %
HCT: 43.3 % (ref 39.0–52.0)
Hemoglobin: 14.1 g/dL (ref 13.0–17.0)
LYMPHS PCT: 28 %
Lymphs Abs: 1.8 10*3/uL (ref 0.7–4.0)
MCH: 31.3 pg (ref 26.0–34.0)
MCHC: 32.6 g/dL (ref 30.0–36.0)
MCV: 96 fL (ref 78.0–100.0)
MONO ABS: 0.7 10*3/uL (ref 0.1–1.0)
Monocytes Relative: 11 %
Neutro Abs: 3.6 10*3/uL (ref 1.7–7.7)
Neutrophils Relative %: 57 %
PLATELETS: 194 10*3/uL (ref 150–400)
RBC: 4.51 MIL/uL (ref 4.22–5.81)
RDW: 12.8 % (ref 11.5–15.5)
WBC: 6.3 10*3/uL (ref 4.0–10.5)

## 2015-08-22 LAB — D-DIMER, QUANTITATIVE: D-Dimer, Quant: 0.3 ug/mL-FEU (ref 0.00–0.50)

## 2015-08-22 LAB — BASIC METABOLIC PANEL
ANION GAP: 5 (ref 5–15)
BUN: 18 mg/dL (ref 6–20)
CALCIUM: 9.5 mg/dL (ref 8.9–10.3)
CO2: 29 mmol/L (ref 22–32)
Chloride: 107 mmol/L (ref 101–111)
Creatinine, Ser: 1.09 mg/dL (ref 0.61–1.24)
GFR calc Af Amer: >60 mL/min (ref >60)
GLUCOSE: 118 mg/dL — AB (ref 65–99)
Potassium: 3.7 mmol/L (ref 3.5–5.1)
Sodium: 141 mmol/L (ref 135–145)

## 2015-08-22 LAB — BRAIN NATRIURETIC PEPTIDE: B Natriuretic Peptide: 5.2 pg/mL (ref 0.0–100.0)

## 2015-08-22 NOTE — ED Notes (Signed)
Patient complains of ongoing left ankle pain and swelling and arrived with ASO on same. Request further evaluation

## 2015-08-22 NOTE — Discharge Instructions (Signed)
Mr. Jammey Routledge,  Nice meeting you! Please follow-up with orthopedics. Return to the emergency department if you develop numbness/tingling, increased pain, new/worsening symptoms. Feel better soon!  S. Wendie Simmer, PA-C

## 2015-08-22 NOTE — ED Provider Notes (Signed)
CSN: [id number]     Arrival date & time 08/22/15  1917 History   First MD Initiated Contact with Patient 08/22/15 1950     Chief Complaint  Patient presents with  . Ankle Pain   HPI   Nathan Santana is a 55 y.o. male PMH significant for HIV (CD4 07/2015 was 330), hypertension presenting with a left ankle pain and swelling for 2.5 months. He describes his pain as left ankle and midfoot in location, radiating to his toes, 7/10 pain scale, constant, worse with palpation, movement, ambulation, weightbearing. In regards to his pain he states "I don't know if this is a blood clot or if this is gout." When asked why he is concerned about a blood clot, he states "I don't know but I was told it could be that." He denies trauma, history of PE/DVT, recent travel, recent surgery. He denies fevers, chills, chest pain, shortness of breath, abdominal pain, nausea, vomiting, numbness/tingling, calf pain. He presents today with ASO wrap on from last ED visit in April.  Past Medical History  Diagnosis Date  . HIV infection ([place])   . Cancer ([place])   . Hypertension   . Angina pectoris ([place])   . Scrotal cyst   . Anxiety   . Depression   . Hypercholesteremia    Past Surgical History  Procedure Laterality Date  . Scrotal turmor removed    . Caner      cancer tumor removed in 2006    Family History  Problem Relation Age of Onset  . Hypertension Other   . CAD Sister    Social History  Substance Use Topics  . Smoking status: Current Some Day Smoker -- 0.50 packs/day    Types: Cigars, Cigarettes  . Smokeless tobacco: None  . Alcohol Use: No     Comment: one beer weekly    Review of Systems  Ten systems are reviewed and are negative for acute change except as noted in the HPI  Allergies  Review of patient's allergies indicates no known allergies.  Home Medications   Prior to Admission medications   Medication Sig Start Date End Date Taking? Authorizing Provider  amLODipine (NORVASC) 10 MG  tablet Take 1 tablet (10 mg total) by mouth daily. 08/20/15   Kerrie Buffalo, NP  aspirin EC 81 MG tablet Take 1 tablet (81 mg total) by mouth daily. 08/20/15   Kerrie Buffalo, NP  efavirenz-emtricitabine-tenofovir (ATRIPLA) 600-200-300 MG tablet Take 1 tablet by mouth at bedtime. 08/20/15   Kerrie Buffalo, NP  fluticasone (FLONASE) 50 MCG/ACT nasal spray Place 1 spray into both nostrils 2 (two) times daily. 08/20/15   Kerrie Buffalo, NP  lisinopril (PRINIVIL,ZESTRIL) 10 MG tablet Take 1 tablet (10 mg total) by mouth daily. 08/20/15   Kerrie Buffalo, NP  nicotine polacrilex (NICORETTE) 2 MG gum Take 1 each (2 mg total) by mouth as needed for smoking cessation. 08/20/15   Kerrie Buffalo, NP  nitroGLYCERIN (NITROSTAT) 0.4 MG SL tablet Place 1 tablet (0.4 mg total) under the tongue every 5 (five) minutes as needed for chest pain. 08/20/15   Kerrie Buffalo, NP  pantoprazole (PROTONIX) 40 MG tablet Take 1 tablet (40 mg total) by mouth daily. 08/20/15   Kerrie Buffalo, NP  PARoxetine (PAXIL) 20 MG tablet Take 1 tablet (20 mg total) by mouth every morning. 08/20/15   Kerrie Buffalo, NP  rosuvastatin (CRESTOR) 20 MG tablet Take 1 tablet (20 mg total) by mouth daily. 08/20/15   Kerrie Buffalo, NP  zolpidem Lorrin Mais)  5 MG tablet Take 1 tablet (5 mg total) by mouth at bedtime. 08/20/15   Kerrie Buffalo, NP   BP 143/93 mmHg  Pulse 65  Temp(Src) 97.9 F (36.6 C)  Resp 18  SpO2 100% Physical Exam  Constitutional: He appears well-developed and well-nourished. No distress.  HENT:  Head: Normocephalic and atraumatic.  Mouth/Throat: Oropharynx is clear and moist. No oropharyngeal exudate.  Eyes: Conjunctivae are normal. Pupils are equal, round, and reactive to light. Right eye exhibits no discharge. Left eye exhibits no discharge. No scleral icterus.  Neck: No tracheal deviation present.  Cardiovascular: Normal rate, regular rhythm, normal heart sounds and intact distal pulses.  Exam reveals no gallop and no friction rub.   No  murmur heard. Pulmonary/Chest: Effort normal and breath sounds normal. No respiratory distress. He has no wheezes. He has no rales. He exhibits no tenderness.  Abdominal: Soft. Bowel sounds are normal. He exhibits no distension and no mass. There is no tenderness. There is no rebound and no guarding.  Musculoskeletal: Normal range of motion. He exhibits edema and tenderness.  Nonpitting edema extending to mid left calf. Neurovascularly intact bilaterally.  Lymphadenopathy:    He has no cervical adenopathy.  Neurological: He is alert. Coordination normal.  Skin: Skin is warm and dry. No rash noted. He is not diaphoretic. No erythema.  Psychiatric: He has a normal mood and affect. His behavior is normal.  Nursing note and vitals reviewed.   ED Course  Procedures  Labs Review Labs Reviewed  BASIC METABOLIC PANEL - Abnormal; Notable for the following:    Glucose, Bld 118 (*)    All other components within normal limits  CBC WITH DIFFERENTIAL/PLATELET  D-DIMER, QUANTITATIVE (NOT AT [facility])  BRAIN NATRIURETIC PEPTIDE   Imaging Review Dg Ankle Complete Left  08/22/2015  CLINICAL DATA:  Pain and swelling in the left ankle.  No trauma. EXAM: LEFT ANKLE COMPLETE - 3+ VIEW COMPARISON:  None. FINDINGS: Soft tissue swelling. No fracture or dislocation. No cause for the patient's symptoms identified. IMPRESSION: Soft tissue swelling.  No underlying cause identified. Electronically Signed   By: Dorise Bullion III M.D   On: 08/22/2015 20:56   I have personally reviewed and evaluated these images and lab results as part of my medical decision-making.  MDM   Final diagnoses:  Ankle swelling, left   Patient nontoxic-appearing, vital signs stable. Patient is resting comfortably. Given patient physical exam, less likely septic arthritis, DVT. Low risk for DVT and d-dimer negative. Patient X-Ray negative for obvious fracture or dislocation. Pain managed in ED. Pt advised to follow up with orthopedics if  symptoms persist for possibility of missed fracture diagnosis. Patient given brace while in ED, conservative therapy recommended and discussed. Patient will be dc home & is agreeable with above plan.    [place], PA-C 08/27/15 [place], MD 08/27/15 2253

## 2015-08-25 ENCOUNTER — Ambulatory Visit (HOSPITAL_BASED_OUTPATIENT_CLINIC_OR_DEPARTMENT_OTHER)
Admission: RE | Admit: 2015-08-25 | Discharge: 2015-08-25 | Disposition: A | Discharge status: Home / Self Care | Payer: Medicare Other | Source: Ambulatory Visit | Attending: Emergency Medicine | Admitting: Emergency Medicine

## 2015-08-25 ENCOUNTER — Encounter (HOSPITAL_COMMUNITY): Payer: Self-pay | Admitting: *Deleted

## 2015-08-25 ENCOUNTER — Emergency Department (HOSPITAL_COMMUNITY)
Admission: EM | Admit: 2015-08-25 | Discharge: 2015-08-25 | Disposition: A | Discharge status: Home / Self Care | Payer: Medicare Other | Attending: Emergency Medicine | Admitting: Emergency Medicine

## 2015-08-25 DIAGNOSIS — M7989 Other specified soft tissue disorders: Secondary | ICD-10-CM

## 2015-08-25 DIAGNOSIS — M79609 Pain in unspecified limb: Secondary | ICD-10-CM

## 2015-08-25 DIAGNOSIS — M79605 Pain in left leg: Secondary | ICD-10-CM | POA: Insufficient documentation

## 2015-08-25 DIAGNOSIS — E7800 Pure hypercholesterolemia, unspecified: Secondary | ICD-10-CM | POA: Insufficient documentation

## 2015-08-25 DIAGNOSIS — F329 Major depressive disorder, single episode, unspecified: Secondary | ICD-10-CM

## 2015-08-25 DIAGNOSIS — B20 Human immunodeficiency virus [HIV] disease: Secondary | ICD-10-CM | POA: Insufficient documentation

## 2015-08-25 DIAGNOSIS — M79662 Pain in left lower leg: Secondary | ICD-10-CM | POA: Insufficient documentation

## 2015-08-25 DIAGNOSIS — F419 Anxiety disorder, unspecified: Secondary | ICD-10-CM | POA: Insufficient documentation

## 2015-08-25 DIAGNOSIS — I10 Essential (primary) hypertension: Secondary | ICD-10-CM | POA: Insufficient documentation

## 2015-08-25 MED ORDER — HYDROCODONE-ACETAMINOPHEN 5-325 MG PO TABS
1.0000 | ORAL_TABLET | Freq: Once | ORAL | Status: AC
Start: 2015-08-25 — End: 2015-08-25
  Administered 2015-08-25: 1 via ORAL
  Filled 2015-08-25: qty 1

## 2015-08-25 MED ORDER — HYDROCODONE-ACETAMINOPHEN 5-325 MG PO TABS: 1.0000 | ORAL_TABLET | Freq: Four times a day (QID) | ORAL | Status: DC | PRN

## 2015-08-25 NOTE — ED Provider Notes (Signed)
CSN: [id number]     Arrival date & time 08/25/15  0032 History  By signing my name below, I, Nathan Santana, attest that this documentation has been prepared under the direction and in the presence of Shanon Rosser, MD. Electronically Signed: Georgette Santana, ED Scribe. 08/25/2015. 3:16 AM.   Chief Complaint  Patient presents with  . Leg Swelling    The history is provided by the patient. No language interpreter was used.   HPI Comments: Nathan Santana is a 55 y.o. male with h/o HIV who presents to the Emergency Department complaining of left ankle swelling onset 2 months ago and worsened yesterday to involve the left lower leg. Patient reports associated sharp, throbbing pain to the left ankle, which has also worsened. Pain is worse with movement or palpation as well as when standing for prolonged periods. Patient denies any injuries, falls or trauma. Pt states his symptoms are worsened with prolonged periods of standing. Patient denies chest pain and trouble breathing.   Past Medical History  Diagnosis Date  . HIV infection ([place])   . Cancer ([place])   . Hypertension   . Angina pectoris ([place])   . Scrotal cyst   . Anxiety   . Depression   . Hypercholesteremia    Past Surgical History  Procedure Laterality Date  . Scrotal turmor removed     Family History  Problem Relation Age of Onset  . Hypertension Other   . CAD Sister    Social History  Substance Use Topics  . Smoking status: Current Some Day Smoker -- 0.50 packs/day    Types: Cigars, Cigarettes  . Smokeless tobacco: None  . Alcohol Use: No     Comment: one beer weekly    Review of Systems A complete 10 system review of systems was obtained and all systems are negative except as noted in the HPI and PMH.    Allergies  Review of patient's allergies indicates no known allergies.  Home Medications   Prior to Admission medications   Medication Sig Start Date End Date Taking? Authorizing Provider  amLODipine (NORVASC) 10 MG  tablet Take 1 tablet (10 mg total) by mouth daily. 08/20/15  Yes Kerrie Buffalo, NP  aspirin EC 81 MG tablet Take 1 tablet (81 mg total) by mouth daily. 08/20/15  Yes Kerrie Buffalo, NP  efavirenz-emtricitabine-tenofovir (ATRIPLA) 600-200-300 MG tablet Take 1 tablet by mouth at bedtime. 08/20/15  Yes Kerrie Buffalo, NP  lisinopril (PRINIVIL,ZESTRIL) 10 MG tablet Take 1 tablet (10 mg total) by mouth daily. 08/20/15  Yes Kerrie Buffalo, NP  naproxen sodium (ANAPROX) 220 MG tablet Take 440 mg by mouth every 12 (twelve) hours as needed (pain).   Yes Historical Provider, MD  pantoprazole (PROTONIX) 40 MG tablet Take 1 tablet (40 mg total) by mouth daily. 08/20/15  Yes Kerrie Buffalo, NP  PARoxetine (PAXIL) 20 MG tablet Take 1 tablet (20 mg total) by mouth every morning. 08/20/15  Yes Kerrie Buffalo, NP  rosuvastatin (CRESTOR) 20 MG tablet Take 1 tablet (20 mg total) by mouth daily. 08/20/15  Yes Kerrie Buffalo, NP  zolpidem (AMBIEN) 5 MG tablet Take 1 tablet (5 mg total) by mouth at bedtime. Patient taking differently: Take 5 mg by mouth at bedtime as needed for sleep.  08/20/15  Yes Kerrie Buffalo, NP  HYDROcodone-acetaminophen (NORCO/VICODIN) 5-325 MG tablet Take 1-2 tablets by mouth every 6 (six) hours as needed (for pain). 08/25/15   Annai Heick, MD   BP 149/83 mmHg  Pulse 78  Resp 18  SpO2 100%   Physical Exam  General: Well-developed, well-nourished male in no acute distress; appearance consistent with age of record HENT: normocephalic; atraumatic Eyes: pupils equal, round and reactive to light; extraocular muscles intact; arcus senilis bilaterally Neck: supple Heart: regular rate and rhythm Lungs: clear to auscultation bilaterally Abdomen: soft; nondistended Extremities: No deformity; full range of motion; pulses normal and symmetric; tenderness and edema of left ankle  Neurologic: Awake, alert and oriented; motor function intact in all extremities and symmetric; no facial droop Skin: Warm and  dry Psychiatric: Normal mood and affect   ED Course  Procedures (including critical care time)   MDM  We'll obtain venous Doppler ultrasound study of the affected leg later today.  Final diagnoses:  Pain in left lower leg   I personally performed the services described in this documentation, which was scribed in my presence. The recorded information has been reviewed and is accurate.     Shanon Rosser, MD 08/25/15 [phone]

## 2015-08-25 NOTE — ED Notes (Signed)
Pt c/o swelling to left leg; pt states that he was seen at [facility] on 6/4 and was advised to wear brace; pt states that the swelling has gotten progressively worse and now extends up his leg to his thigh; pt has pants on in triage unable to pull up last lower leg; pt states that he has been taking Ibuprofen without relief of pain; pt states that he has to lean to the right while sitting due to the pain in his leg; pt states that he was advised that when he was seen at [facility] that they were unsure if it was gout or a blood clot; pt states "I need to know what it is"

## 2015-08-25 NOTE — Progress Notes (Signed)
VASCULAR LAB PRELIMINARY  PRELIMINARY  PRELIMINARY  PRELIMINARY  Left lower extremity venous duplex completed.    Preliminary report:  Left:  No evidence of DVT, superficial thrombosis, or Baker's cyst.  Dymphna Wadley, RVS 08/25/2015, 9:11 AM

## 2015-08-28 ENCOUNTER — Emergency Department (HOSPITAL_COMMUNITY): Payer: Medicare Other

## 2015-08-28 ENCOUNTER — Encounter (HOSPITAL_COMMUNITY): Payer: Self-pay

## 2015-08-28 ENCOUNTER — Emergency Department (HOSPITAL_COMMUNITY)
Admission: EM | Admit: 2015-08-28 | Discharge: 2015-08-28 | Disposition: A | Discharge status: Home / Self Care | Payer: Medicare Other | Attending: Emergency Medicine | Admitting: Emergency Medicine

## 2015-08-28 DIAGNOSIS — R0789 Other chest pain: Secondary | ICD-10-CM | POA: Insufficient documentation

## 2015-08-28 DIAGNOSIS — M25572 Pain in left ankle and joints of left foot: Secondary | ICD-10-CM | POA: Insufficient documentation

## 2015-08-28 DIAGNOSIS — Z21 Asymptomatic human immunodeficiency virus [HIV] infection status: Secondary | ICD-10-CM | POA: Insufficient documentation

## 2015-08-28 DIAGNOSIS — Z7982 Long term (current) use of aspirin: Secondary | ICD-10-CM | POA: Diagnosis not present

## 2015-08-28 DIAGNOSIS — R55 Syncope and collapse: Secondary | ICD-10-CM | POA: Diagnosis not present

## 2015-08-28 DIAGNOSIS — E876 Hypokalemia: Secondary | ICD-10-CM | POA: Diagnosis not present

## 2015-08-28 DIAGNOSIS — Z859 Personal history of malignant neoplasm, unspecified: Secondary | ICD-10-CM | POA: Insufficient documentation

## 2015-08-28 DIAGNOSIS — Z791 Long term (current) use of non-steroidal anti-inflammatories (NSAID): Secondary | ICD-10-CM | POA: Diagnosis not present

## 2015-08-28 DIAGNOSIS — Z79899 Other long term (current) drug therapy: Secondary | ICD-10-CM | POA: Diagnosis not present

## 2015-08-28 DIAGNOSIS — F17210 Nicotine dependence, cigarettes, uncomplicated: Secondary | ICD-10-CM | POA: Diagnosis not present

## 2015-08-28 DIAGNOSIS — E7800 Pure hypercholesterolemia, unspecified: Secondary | ICD-10-CM | POA: Insufficient documentation

## 2015-08-28 DIAGNOSIS — I10 Essential (primary) hypertension: Secondary | ICD-10-CM | POA: Insufficient documentation

## 2015-08-28 DIAGNOSIS — I252 Old myocardial infarction: Secondary | ICD-10-CM | POA: Insufficient documentation

## 2015-08-28 DIAGNOSIS — R079 Chest pain, unspecified: Secondary | ICD-10-CM

## 2015-08-28 HISTORY — DX: Old myocardial infarction: I25.2

## 2015-08-28 LAB — COMPREHENSIVE METABOLIC PANEL
ALBUMIN: 3.4 g/dL — AB (ref 3.5–5.0)
ALT: 21 U/L (ref 17–63)
ANION GAP: 5 (ref 5–15)
AST: 22 U/L (ref 15–41)
Alkaline Phosphatase: 102 U/L (ref 38–126)
BILIRUBIN TOTAL: 0.8 mg/dL (ref 0.3–1.2)
BUN: 12 mg/dL (ref 6–20)
CO2: 24 mmol/L (ref 22–32)
Calcium: 8.9 mg/dL (ref 8.9–10.3)
Chloride: 110 mmol/L (ref 101–111)
Creatinine, Ser: 0.95 mg/dL (ref 0.61–1.24)
GFR calc Af Amer: >60 mL/min (ref >60)
GFR calc non Af Amer: >60 mL/min (ref >60)
GLUCOSE: 95 mg/dL (ref 65–99)
POTASSIUM: 3 mmol/L — AB (ref 3.5–5.1)
SODIUM: 139 mmol/L (ref 135–145)
TOTAL PROTEIN: 6.6 g/dL (ref 6.5–8.1)

## 2015-08-28 LAB — CBC WITH DIFFERENTIAL/PLATELET
BASOS PCT: 0 %
Basophils Absolute: 0 10*3/uL (ref 0.0–0.1)
EOS ABS: 0.1 10*3/uL (ref 0.0–0.7)
Eosinophils Relative: 2 %
HCT: 36.4 % — ABNORMAL LOW (ref 39.0–52.0)
Hemoglobin: 11.8 g/dL — ABNORMAL LOW (ref 13.0–17.0)
Lymphocytes Relative: 24 %
Lymphs Abs: 1.7 10*3/uL (ref 0.7–4.0)
MCH: 30.1 pg (ref 26.0–34.0)
MCHC: 32.4 g/dL (ref 30.0–36.0)
MCV: 92.9 fL (ref 78.0–100.0)
MONO ABS: 0.7 10*3/uL (ref 0.1–1.0)
MONOS PCT: 10 %
Neutro Abs: 4.6 10*3/uL (ref 1.7–7.7)
Neutrophils Relative %: 64 %
Platelets: 169 10*3/uL (ref 150–400)
RBC: 3.92 MIL/uL — ABNORMAL LOW (ref 4.22–5.81)
RDW: 12.5 % (ref 11.5–15.5)
WBC: 7.1 10*3/uL (ref 4.0–10.5)

## 2015-08-28 LAB — I-STAT TROPONIN, ED
TROPONIN I, POC: 0 ng/mL (ref 0.00–0.08)
Troponin i, poc: 0.01 ng/mL (ref 0.00–0.08)

## 2015-08-28 MED ORDER — NITROGLYCERIN 2 % TD OINT
1.0000 [in_us] | TOPICAL_OINTMENT | Freq: Once | TRANSDERMAL | Status: AC
  Administered 2015-08-28: 1 [in_us] via TOPICAL
  Filled 2015-08-28: qty 1

## 2015-08-28 MED ORDER — POTASSIUM CHLORIDE 10 MEQ/100ML IV SOLN
10.0000 meq | INTRAVENOUS | Status: AC
  Administered 2015-08-28 (×2): 10 meq via INTRAVENOUS
  Filled 2015-08-28 (×2): qty 100

## 2015-08-28 MED ORDER — POTASSIUM CHLORIDE CRYS ER 20 MEQ PO TBCR
40.0000 meq | EXTENDED_RELEASE_TABLET | Freq: Once | ORAL | Status: AC
  Administered 2015-08-28: 40 meq via ORAL
  Filled 2015-08-28: qty 2

## 2015-08-28 MED ORDER — NITROGLYCERIN 0.4 MG SL SUBL: 0.4000 mg | SUBLINGUAL_TABLET | SUBLINGUAL | Status: DC | PRN
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
## 2015-08-28 NOTE — Discharge Instructions (Signed)
1. Medications: nitro refill, usual home medications 2. Treatment: rest, drink plenty of fluids,  3. Follow Up: Please followup with your primary doctor in 2-3 days for discussion of your diagnoses and further evaluation after today's visit; if you do not have a primary care doctor use the resource guide provided to find one; Please return to the ER for worsening symptoms
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
## 2015-09-06 ENCOUNTER — Emergency Department (HOSPITAL_COMMUNITY): Payer: Medicare Other

## 2015-09-06 ENCOUNTER — Encounter (HOSPITAL_COMMUNITY): Payer: Self-pay | Admitting: Emergency Medicine

## 2015-09-06 ENCOUNTER — Other Ambulatory Visit: Payer: Self-pay

## 2015-09-06 ENCOUNTER — Emergency Department (HOSPITAL_COMMUNITY)
Admission: EM | Admit: 2015-09-06 | Discharge: 2015-09-07 | Disposition: A | Discharge status: Other Acute Inpatient Hospital | Payer: Medicare Other | Attending: Emergency Medicine | Admitting: Emergency Medicine

## 2015-09-06 DIAGNOSIS — F17210 Nicotine dependence, cigarettes, uncomplicated: Secondary | ICD-10-CM | POA: Diagnosis not present

## 2015-09-06 DIAGNOSIS — Z79899 Other long term (current) drug therapy: Secondary | ICD-10-CM | POA: Insufficient documentation

## 2015-09-06 DIAGNOSIS — F332 Major depressive disorder, recurrent severe without psychotic features: Secondary | ICD-10-CM | POA: Insufficient documentation

## 2015-09-06 DIAGNOSIS — I10 Essential (primary) hypertension: Secondary | ICD-10-CM | POA: Diagnosis not present

## 2015-09-06 DIAGNOSIS — F1424 Cocaine dependence with cocaine-induced mood disorder: Secondary | ICD-10-CM | POA: Diagnosis present

## 2015-09-06 DIAGNOSIS — Z7982 Long term (current) use of aspirin: Secondary | ICD-10-CM | POA: Diagnosis not present

## 2015-09-06 DIAGNOSIS — E7800 Pure hypercholesterolemia, unspecified: Secondary | ICD-10-CM | POA: Diagnosis not present

## 2015-09-06 DIAGNOSIS — Z791 Long term (current) use of non-steroidal anti-inflammatories (NSAID): Secondary | ICD-10-CM | POA: Diagnosis not present

## 2015-09-06 DIAGNOSIS — B20 Human immunodeficiency virus [HIV] disease: Secondary | ICD-10-CM | POA: Insufficient documentation

## 2015-09-06 DIAGNOSIS — I252 Old myocardial infarction: Secondary | ICD-10-CM | POA: Insufficient documentation

## 2015-09-06 DIAGNOSIS — R45851 Suicidal ideations: Secondary | ICD-10-CM | POA: Diagnosis present

## 2015-09-06 DIAGNOSIS — Z859 Personal history of malignant neoplasm, unspecified: Secondary | ICD-10-CM | POA: Insufficient documentation

## 2015-09-06 DIAGNOSIS — F1210 Cannabis abuse, uncomplicated: Secondary | ICD-10-CM | POA: Diagnosis not present

## 2015-09-06 LAB — CBC
HCT: 38 % — ABNORMAL LOW (ref 39.0–52.0)
Hemoglobin: 13.1 g/dL (ref 13.0–17.0)
MCH: 32 pg (ref 26.0–34.0)
MCHC: 34.5 g/dL (ref 30.0–36.0)
MCV: 92.7 fL (ref 78.0–100.0)
PLATELETS: 145 10*3/uL — AB (ref 150–400)
RBC: 4.1 MIL/uL — AB (ref 4.22–5.81)
RDW: 12.9 % (ref 11.5–15.5)
WBC: 4.3 10*3/uL (ref 4.0–10.5)

## 2015-09-06 LAB — COMPREHENSIVE METABOLIC PANEL
ALBUMIN: 3.8 g/dL (ref 3.5–5.0)
ALK PHOS: 108 U/L (ref 38–126)
ALT: 20 U/L (ref 17–63)
ANION GAP: 5 (ref 5–15)
AST: 24 U/L (ref 15–41)
BILIRUBIN TOTAL: 0.9 mg/dL (ref 0.3–1.2)
BUN: 16 mg/dL (ref 6–20)
CO2: 26 mmol/L (ref 22–32)
Calcium: 8.9 mg/dL (ref 8.9–10.3)
Chloride: 107 mmol/L (ref 101–111)
Creatinine, Ser: 1.04 mg/dL (ref 0.61–1.24)
GFR calc Af Amer: >60 mL/min (ref >60)
GFR calc non Af Amer: >60 mL/min (ref >60)
GLUCOSE: 98 mg/dL (ref 65–99)
POTASSIUM: 3.8 mmol/L (ref 3.5–5.1)
Sodium: 138 mmol/L (ref 135–145)
TOTAL PROTEIN: 7.5 g/dL (ref 6.5–8.1)

## 2015-09-06 LAB — SALICYLATE LEVEL: Salicylate Lvl: <4 mg/dL (ref 2.8–30.0)

## 2015-09-06 LAB — RAPID URINE DRUG SCREEN, HOSP PERFORMED
AMPHETAMINES: NOT DETECTED
Barbiturates: NOT DETECTED
Benzodiazepines: NOT DETECTED
Cocaine: POSITIVE — AB
Opiates: NOT DETECTED
Tetrahydrocannabinol: POSITIVE — AB

## 2015-09-06 LAB — ACETAMINOPHEN LEVEL

## 2015-09-06 LAB — ETHANOL: Alcohol, Ethyl (B): <5 mg/dL (ref <5)

## 2015-09-06 MED ORDER — ASPIRIN EC 81 MG PO TBEC
81.0000 mg | DELAYED_RELEASE_TABLET | Freq: Every day | ORAL | Status: DC
  Administered 2015-09-07: 81 mg via ORAL
  Filled 2015-09-06 (×2): qty 1

## 2015-09-06 MED ORDER — ZOLPIDEM TARTRATE 5 MG PO TABS: 5.0000 mg | ORAL_TABLET | Freq: Every evening | ORAL | Status: DC | PRN

## 2015-09-06 MED ORDER — PAROXETINE HCL 20 MG PO TABS
20.0000 mg | ORAL_TABLET | Freq: Every day | ORAL | Status: DC
  Administered 2015-09-07: 20 mg via ORAL
  Filled 2015-09-06 (×2): qty 1

## 2015-09-06 MED ORDER — CARVEDILOL 25 MG PO TABS
25.0000 mg | ORAL_TABLET | Freq: Two times a day (BID) | ORAL | Status: DC
  Administered 2015-09-07 (×2): 25 mg via ORAL
  Filled 2015-09-06 (×4): qty 1

## 2015-09-06 MED ORDER — PANTOPRAZOLE SODIUM 40 MG PO TBEC
40.0000 mg | DELAYED_RELEASE_TABLET | Freq: Every day | ORAL | Status: DC
  Administered 2015-09-07: 40 mg via ORAL
  Filled 2015-09-06: qty 1

## 2015-09-06 MED ORDER — ROSUVASTATIN CALCIUM 20 MG PO TABS
20.0000 mg | ORAL_TABLET | Freq: Every day | ORAL | Status: DC
  Administered 2015-09-07: 20 mg via ORAL
  Filled 2015-09-06 (×2): qty 1

## 2015-09-06 MED ORDER — LISINOPRIL 10 MG PO TABS
10.0000 mg | ORAL_TABLET | Freq: Every day | ORAL | Status: DC
  Administered 2015-09-07: 10 mg via ORAL
  Filled 2015-09-06 (×2): qty 1

## 2015-09-06 MED ORDER — EFAVIRENZ-EMTRICITAB-TENOFOVIR 600-200-300 MG PO TABS
1.0000 | ORAL_TABLET | Freq: Every day | ORAL | Status: DC
  Filled 2015-09-06 (×2): qty 1

## 2015-09-06 MED ORDER — AMLODIPINE BESYLATE 10 MG PO TABS
10.0000 mg | ORAL_TABLET | Freq: Every day | ORAL | Status: DC
  Administered 2015-09-07: 10 mg via ORAL
  Filled 2015-09-06 (×2): qty 1

## 2015-09-06 MED ORDER — NITROGLYCERIN 0.4 MG SL SUBL: 0.4000 mg | SUBLINGUAL_TABLET | SUBLINGUAL | Status: DC | PRN

## 2015-09-06 NOTE — ED Notes (Signed)
Patient presents for medical clearance. Stats yesterday he walked to the 4th floor of a parking deck and "thought if I jumped off, if I would survive". Denies HI/AVH.
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
## 2015-09-06 NOTE — ED Notes (Signed)
Patient and belongings have been wanded by security 

## 2015-09-07 ENCOUNTER — Encounter (HOSPITAL_COMMUNITY): Payer: Self-pay | Admitting: *Deleted

## 2015-09-07 ENCOUNTER — Inpatient Hospital Stay (HOSPITAL_COMMUNITY)
Admission: AD | Admit: 2015-09-07 | Discharge: 2015-09-17 | DRG: 897 | Disposition: A | Discharge status: Home / Self Care | Payer: Medicare Other | Source: Intra-hospital | Attending: Psychiatry | Admitting: Psychiatry

## 2015-09-07 DIAGNOSIS — I10 Essential (primary) hypertension: Secondary | ICD-10-CM

## 2015-09-07 DIAGNOSIS — F332 Major depressive disorder, recurrent severe without psychotic features: Secondary | ICD-10-CM | POA: Diagnosis present

## 2015-09-07 DIAGNOSIS — R45851 Suicidal ideations: Secondary | ICD-10-CM | POA: Diagnosis present

## 2015-09-07 DIAGNOSIS — F1424 Cocaine dependence with cocaine-induced mood disorder: Secondary | ICD-10-CM | POA: Diagnosis present

## 2015-09-07 DIAGNOSIS — Z21 Asymptomatic human immunodeficiency virus [HIV] infection status: Secondary | ICD-10-CM | POA: Diagnosis present

## 2015-09-07 DIAGNOSIS — K219 Gastro-esophageal reflux disease without esophagitis: Secondary | ICD-10-CM | POA: Diagnosis present

## 2015-09-07 DIAGNOSIS — Z590 Homelessness: Secondary | ICD-10-CM | POA: Diagnosis not present

## 2015-09-07 DIAGNOSIS — F17210 Nicotine dependence, cigarettes, uncomplicated: Secondary | ICD-10-CM | POA: Diagnosis present

## 2015-09-07 DIAGNOSIS — I252 Old myocardial infarction: Secondary | ICD-10-CM | POA: Diagnosis not present

## 2015-09-07 DIAGNOSIS — F329 Major depressive disorder, single episode, unspecified: Secondary | ICD-10-CM | POA: Diagnosis present

## 2015-09-07 MED ORDER — CARVEDILOL 25 MG PO TABS
25.0000 mg | ORAL_TABLET | Freq: Two times a day (BID) | ORAL | Status: DC
  Administered 2015-09-08 – 2015-09-17 (×19): 25 mg via ORAL
  Filled 2015-09-07 (×23): qty 1

## 2015-09-07 MED ORDER — MAGNESIUM HYDROXIDE 400 MG/5ML PO SUSP: 30.0000 mL | Freq: Every day | ORAL | Status: DC | PRN

## 2015-09-07 MED ORDER — PANTOPRAZOLE SODIUM 40 MG PO TBEC
40.0000 mg | DELAYED_RELEASE_TABLET | Freq: Every day | ORAL | Status: DC
  Administered 2015-09-08 – 2015-09-17 (×10): 40 mg via ORAL
  Filled 2015-09-07 (×12): qty 1

## 2015-09-07 MED ORDER — ACETAMINOPHEN 500 MG PO TABS
1000.0000 mg | ORAL_TABLET | Freq: Four times a day (QID) | ORAL | Status: DC | PRN
  Administered 2015-09-09 – 2015-09-17 (×14): 1000 mg via ORAL
  Filled 2015-09-07 (×14): qty 2

## 2015-09-07 MED ORDER — NITROGLYCERIN 0.4 MG SL SUBL
0.4000 mg | SUBLINGUAL_TABLET | SUBLINGUAL | Status: DC | PRN
  Administered 2015-09-12: 0.4 mg via SUBLINGUAL
  Filled 2015-09-07: qty 1

## 2015-09-07 MED ORDER — PAROXETINE HCL 20 MG PO TABS
20.0000 mg | ORAL_TABLET | Freq: Every day | ORAL | Status: DC
  Administered 2015-09-08: 20 mg via ORAL
  Filled 2015-09-07 (×3): qty 1

## 2015-09-07 MED ORDER — ACETAMINOPHEN 325 MG PO TABS
650.0000 mg | ORAL_TABLET | Freq: Four times a day (QID) | ORAL | Status: DC | PRN
Start: 2015-09-07 — End: 2015-09-07

## 2015-09-07 MED ORDER — LISINOPRIL 10 MG PO TABS
10.0000 mg | ORAL_TABLET | Freq: Every day | ORAL | Status: DC
  Administered 2015-09-08 – 2015-09-17 (×10): 10 mg via ORAL
  Filled 2015-09-07 (×12): qty 1

## 2015-09-07 MED ORDER — CLONIDINE HCL 0.1 MG PO TABS
0.1000 mg | ORAL_TABLET | Freq: Once | ORAL | Status: AC
  Administered 2015-09-07: 0.1 mg via ORAL
  Filled 2015-09-07: qty 1

## 2015-09-07 MED ORDER — ASPIRIN EC 81 MG PO TBEC
81.0000 mg | DELAYED_RELEASE_TABLET | Freq: Every day | ORAL | Status: DC
  Administered 2015-09-08 – 2015-09-17 (×10): 81 mg via ORAL
  Filled 2015-09-07 (×12): qty 1

## 2015-09-07 MED ORDER — ALUM & MAG HYDROXIDE-SIMETH 200-200-20 MG/5ML PO SUSP: 30.0000 mL | ORAL | Status: DC | PRN

## 2015-09-07 MED ORDER — ROSUVASTATIN CALCIUM 20 MG PO TABS
20.0000 mg | ORAL_TABLET | Freq: Every day | ORAL | Status: DC
  Administered 2015-09-08 – 2015-09-17 (×10): 20 mg via ORAL
  Filled 2015-09-07 (×12): qty 1

## 2015-09-07 MED ORDER — ACETAMINOPHEN 500 MG PO TABS
1000.0000 mg | ORAL_TABLET | Freq: Four times a day (QID) | ORAL | Status: DC | PRN
  Administered 2015-09-07: 1000 mg via ORAL
  Filled 2015-09-07: qty 2

## 2015-09-07 MED ORDER — EFAVIRENZ-EMTRICITAB-TENOFOVIR 600-200-300 MG PO TABS
1.0000 | ORAL_TABLET | Freq: Every day | ORAL | Status: DC
  Administered 2015-09-08 – 2015-09-16 (×9): 1 via ORAL
  Filled 2015-09-07 (×13): qty 1

## 2015-09-07 MED ORDER — ZOLPIDEM TARTRATE 5 MG PO TABS
5.0000 mg | ORAL_TABLET | Freq: Every evening | ORAL | Status: DC | PRN
  Administered 2015-09-08 – 2015-09-16 (×8): 5 mg via ORAL
  Filled 2015-09-07 (×8): qty 1

## 2015-09-07 MED ORDER — AMLODIPINE BESYLATE 10 MG PO TABS
10.0000 mg | ORAL_TABLET | Freq: Every day | ORAL | Status: DC
  Administered 2015-09-08 – 2015-09-17 (×10): 10 mg via ORAL
  Filled 2015-09-07 (×12): qty 1

## 2015-09-07 NOTE — Progress Notes (Signed)
Nathan Santana is a 55 year old male admitted on a voluntary basis. On admission, he reports some passive SI and reports that he has been off his medications for the past couple weeks. He does endorse some cocaine and marijuana usage and reports that he lives around some people he does not want to be around and is thinking about finding someplace new to go. Nathan Santana is able to contract for safety in the milieu and spoke about how he wants to get back on his medications and learn how to make better decisions. Pt was oriented to the unit and safety maintained.
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
## 2015-09-07 NOTE — Tx Team (Signed)
Initial Interdisciplinary Treatment Plan   PATIENT STRESSORS: Financial difficulties Medication change or noncompliance Substance abuse   PATIENT STRENGTHS: Ability for insight Active sense of humor Average or above average intelligence Capable of independent living General fund of knowledge Motivation for treatment/growth   PROBLEM LIST: Problem List/Patient Goals Date to be addressed Date deferred Reason deferred Estimated date of resolution  Depression 09/07/15     Suicidal thoughts 09/07/15     " I need to learn how to deal with stuff and make right decisions" 09/07/15                                          DISCHARGE CRITERIA:  Ability to meet basic life and health needs Improved stabilization in mood, thinking, and/or behavior Verbal commitment to aftercare and medication compliance  PRELIMINARY DISCHARGE PLAN: Attend aftercare/continuing care group Placement in alternative living arrangements  PATIENT/FAMIILY INVOLVEMENT: This treatment plan has been presented to and reviewed with the patient, Nathan Santana, and/or family member, .  The patient and family have been given the opportunity to ask questions and make suggestions.  [place], [place] 09/07/2015, 8:05 PM
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
## 2015-09-07 NOTE — Progress Notes (Signed)
Patient ID: Nathan Santana, male   DOB: [birth year], 55 y.o.   MRN: [id number] Per State regulations 482.30 this chart was reviewed for medical necessity with respect to the patient's admission/duration of stay.    Next review date: 09/10/15  Debarah Crape, BSN, RN-BC  Case Manager

## 2015-09-07 NOTE — ED Notes (Signed)
Patient is alert and oriented. Patient states he is having SI. Patient denies HI and AVH. Patient states he is depressed and wants help. Q 15 minute checks in progress and maintained for safety. Patient given food and water to drink. Monitoring of patient continues.

## 2015-09-07 NOTE — Progress Notes (Signed)
Adult Psychoeducational Group Note  Date:  09/07/2015 Time:  9:12 PM  Group Topic/Focus:  Wrap-Up Group:   The focus of this group is to help patients review their daily goal of treatment and discuss progress on daily workbooks.  Participation Level:  Active  Participation Quality:  Appropriate  Affect:  Appropriate  Cognitive:  Appropriate  Insight: Appropriate and Good  Engagement in Group:  Engaged  Modes of Intervention:  Activity  Additional Comments:  Patient rated his day a 62. Stated he had a good day and wants to think positive and focus on self to get better.  Nathan Santana Nathan Santana 09/07/2015, 9:12 PM

## 2015-09-08 ENCOUNTER — Encounter (HOSPITAL_COMMUNITY): Payer: Self-pay | Admitting: Nurse Practitioner

## 2015-09-08 MED ORDER — PAROXETINE HCL 30 MG PO TABS
30.0000 mg | ORAL_TABLET | Freq: Every day | ORAL | Status: DC
  Administered 2015-09-09 – 2015-09-17 (×9): 30 mg via ORAL
  Filled 2015-09-08 (×11): qty 1

## 2015-09-08 NOTE — Progress Notes (Signed)
D   Pt is pleasant on approach and cooperative   He requested his sleep medication and had no other complaints   Pt does endorse some depression and anxiety   His behavior is appropriate on the unit and he interacts well with others A   Verbal support given   Medications administered and effectiveness monitored   Q 15 min checks R   Pt is safe at present and receptive to verbal support

## 2015-09-08 NOTE — BHH Group Notes (Signed)
[facility] LCSW Group Therapy 09/08/2015 1:15 PM  Type of Therapy: Group Therapy- Emotion Regulation  Participation Level: Active   Participation Quality:  Appropriate  Affect: Appropriate  Cognitive: Alert and Oriented   Insight:  Developing/Improving  Engagement in Therapy: Developing/Improving and Engaged   Modes of Intervention: Clarification, Confrontation, Discussion, Education, Exploration, Limit-setting, Orientation, Problem-solving, Rapport Building, Art therapist, Socialization and Support  Summary of Progress/Problems: The topic for group today was emotional regulation. This group focused on both positive and negative emotion identification and allowed group members to process ways to identify feelings, regulate negative emotions, and find healthy ways to manage internal/external emotions. Group members were asked to reflect on a time when their reaction to an emotion led to a negative outcome and explored how alternative responses using emotion regulation would have benefited them. Group members were also asked to discuss a time when emotion regulation was utilized when a negative emotion was experienced. Pt focused on his anger and difficulty trusting others. He processed the negative perception he has regarding people's motives to help him. He demonstrates developing insight AEB his ability to articulate and identify negative emotions and irrational thoughts related to those feelings.   [name], LCSWA 09/08/2015 4:14 PM
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
## 2015-09-08 NOTE — BHH Group Notes (Signed)
[facility] LCSW Aftercare Discharge Planning Group Note  09/08/2015 8:45 AM  Participation Quality: Alert, Appropriate and Oriented  Mood/Affect: Flat  Depression Rating: "high"  Anxiety Rating: "high"  Thoughts of Suicide: Pt denies SI/HI  Will you contract for safety? Yes  Current AVH: Pt denies  Plan for Discharge/Comments: Pt attended discharge planning group and actively participated in group. CSW discussed suicide prevention education with the group and encouraged them to discuss discharge planning and any relevant barriers. Pt expresses that he is not in a good frame of mind and is feeling depressed.  Transportation Means: Pt reports access to transportation  Supports: No supports mentioned at this time  [name], [place] 09/08/2015 10:22 AM

## 2015-09-08 NOTE — Progress Notes (Signed)
DAR NOTE: Pt present with flat affect and depressed mood in the unit. Pt has been isolating himself and has been bed most of the time. Pt complained of stomach ache, took all his meds as scheduled. As per self inventory, pt had a poor night sleep, fair appetite, low energy, and poor concentration. Pt rate depression at 0, hopeless ness at 0. Pt's safety ensured with 15 minute and environmental checks. Pt currently denies SI/HI and A/V hallucinations. Pt verbally agrees to seek staff if SI/HI or A/VH occurs and to consult with staff before acting on these thoughts. Will continue POC.
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
## 2015-09-08 NOTE — Progress Notes (Signed)
Recreation Therapy Notes  Date: 06.21.2017 Time: 9:30am Location: [address] Dayroom   Group Topic: Stress Management  Goal Area(s) Addresses:  Patient will actively participate in stress management techniques presented during session.   Behavioral Response: Did not attend.   Laureen Ochs Johney Perotti, LRT/CTRS         Nikya Busler L 09/08/2015 1:23 PM

## 2015-09-08 NOTE — [facility] Suicide Risk Assessment (Signed)
[facility] Admission Suicide Risk Assessment   Nursing information obtained from:    Demographic factors:   patient is an African-American male Current Mental Status:   alert and oriented to all spheres. Speech is low in volume. Denies any psychotic symptoms. Endorsing depression. Denies suicidal or homicidal ideations. Loss Factors:   loss of job Historical Factors:   patient has had several hospitalizations here Risk Reduction Factors:   and has motivation to do better  Total Time spent with patient: 15 minutes Principal Problem: <principal problem not specified> Diagnosis:   Patient Active Problem List   Diagnosis Date Noted  . Cocaine dependence with cocaine-induced mood disorder ([place]) [F14.24] 09/07/2015  . Severe episode of recurrent major depressive disorder, without psychotic features ([place]) [F33.2]   . Hyperlipidemia LDL goal <130 [E78.5] 03/01/2015  . Orchalgia [N50.819]   . Testicular pain, right [N50.811]   . HIV (human immunodeficiency virus infection) ([place]) [Z21] 02/05/2015  . CAD (coronary artery disease) [I25.10] 02/05/2015  . Chest pain [R07.9] 02/05/2015  . Suicidal ideations [R45.851] 02/05/2015  . Hypertension [I10] 02/05/2015  . Anxiety [F41.9]   . Pain in the chest [R07.9]   . Depression [F32.9]   . Suicidal ideation [R45.851]    Subjective Data: Patient is a 55 year old Latin American male with long history of cocaine dependence who is admitted with depression and suicidal thoughts  Continued Clinical Symptoms:  Alcohol Use Disorder Identification Test Final Score (AUDIT): 5 The "Alcohol Use Disorders Identification Test", Guidelines for Use in Primary Care, Second Edition.  World Pharmacologist [facility]). Score between 0-7:  no or low risk or alcohol related problems. Score between 8-15:  moderate risk of alcohol related problems. Score between 16-19:  high risk of alcohol related problems. Score 20 or above:  warrants further diagnostic evaluation for alcohol  dependence and treatment.   CLINICAL FACTORS:   Depression:   Anhedonia Hopelessness Impulsivity Insomnia Severe   Musculoskeletal: Strength & Muscle Tone: within normal limits Gait & Station: normal Patient leans: N/A  Psychiatric Specialty Exam: Physical Exam  ROS  Blood pressure 152/80, pulse 78, temperature 98.3 F (36.8 C), temperature source Oral, resp. rate 18, height 5\' 9"  (1.753 m), weight 182 lb (82.555 kg).Body mass index is 26.86 kg/(m^2).  General Appearance: Casual  Eye Contact:  Fair  Speech:  Pressured  Volume:  Decreased  Mood:  Depressed and Dysphoric  Affect:  Congruent  Thought Process:  Coherent  Orientation:  Full (Time, Place, and Person)  Thought Content:  WDL  Suicidal Thoughts:  No  Homicidal Thoughts:  No  Memory:  Immediate;   Fair Recent;   Fair Remote;   Fair  Judgement:  Impaired  Insight:  Shallow  Psychomotor Activity:  Normal  Concentration:  Concentration: Fair and Attention Span: Fair  Recall:  [employer] of Knowledge:  Fair  Language:  Fair  Akathisia:  No  Handed:  Right  AIMS (if indicated):     Assets:  Communication Skills Desire for Improvement  ADL's:  Intact  Cognition:  WNL  Sleep:  Number of Hours: 5.75      COGNITIVE FEATURES THAT CONTRIBUTE TO RISK:  Thought constriction (tunnel vision)    SUICIDE RISK:   Moderate:  Frequent suicidal ideation with limited intensity, and duration, some specificity in terms of plans, no associated intent, good self-control, limited dysphoria/symptomatology, some risk factors present, and identifiable protective factors, including available and accessible social support.  PLAN OF CARE:  Provide supportive counseling Monitor for withdrawal  symptoms Start medications as appropriate Monitor For mood and safety  I certify that inpatient services furnished can reasonably be expected to improve the patient's condition.   Elvin So, MD 09/08/2015, 10:42 AM

## 2015-09-08 NOTE — Progress Notes (Signed)
Nutrition Brief Note  Patient identified on the Malnutrition Screening Tool (MST) Report  Wt Readings from Last 15 Encounters:  09/07/15 182 lb (82.555 kg)  08/17/15 182 lb (82.555 kg)  07/18/15 184 lb 11.2 oz (83.779 kg)  03/01/15 199 lb (90.266 kg)  02/06/15 196 lb 3.4 oz (89 kg)  02/02/15 190 lb (86.183 kg)    Body mass index is 26.86 kg/(m^2). Patient meets criteria for overweight based on current BMI.   Diet Order: Diet Heart Room service appropriate?: Yes; Fluid consistency:: Thin Pt is also offered choice of unit snacks mid-morning and mid-afternoon.    Labs and medications reviewed.   No nutrition interventions warranted at this time. If nutrition issues arise, please consult RD.   Clayton Bibles, MS, RD, LDN Pager: [phone] After Hours Pager: [phone]

## 2015-09-08 NOTE — Tx Team (Signed)
Interdisciplinary Treatment Plan Update (Adult) Date: 09/08/2015   Date: 09/08/2015 8:26 AM  Progress in Treatment:  Attending groups: Pt is new to milieu, continuing to assess  Participating in groups: Pt is new to milieu, continuing to assess  Taking medication as prescribed: Yes  Tolerating medication: Yes  Family/Significant othe contact made: No, CSW assessing for appropriate contact Patient understands diagnosis: Yes AEB seeking help with depression  Discussing patient identified problems/goals with staff: Yes  Medical problems stabilized or resolved: Yes  Denies suicidal/homicidal ideation: Yes Patient has not harmed self or Others: Yes   New problem(s) identified: None identified at this time.   Discharge Plan or Barriers: CSW will assess for appropriate discharge plan and relevant barriers.   Additional comments:  Patient and CSW reviewed pt's identified goals and treatment plan. Patient verbalized understanding and agreed to treatment plan. CSW reviewed [facility] "Discharge Process and Patient Involvement" Form. Pt verbalized understanding of information provided and signed form.   Reason for Continuation of Hospitalization:  Depression Medication stabilization Suicidal ideation  Estimated length of stay: 3-5 days  Review of initial/current patient goals per problem list:   1.  Goal(s): Patient will participate in aftercare plan  Met:  No  Target date: 3-5 days from date of admission   As evidenced by: Patient will participate within aftercare plan AEB aftercare provider and housing plan at discharge being identified.  09/08/15: CSW to work with Pt to assess for appropriate discharge plan and faciliate appointments and referrals as needed prior to d/c.  2.  Goal (s): Patient will exhibit decreased depressive symptoms and suicidal ideations.  Met:  No  Target date: 3-5 days from date of admission   As evidenced by: Patient will utilize self rating of depression at 3 or  below and demonstrate decreased signs of depression or be deemed stable for discharge by MD. 09/08/15: Pt was admitted with symptoms of depression, rating 10/10. Pt continues to present with flat affect and depressive symptoms.  Pt will demonstrate decreased symptoms of depression and rate depression at 3/10 or lower prior to discharge.  Attendees:  Patient:    Family:    Physician: Dr. Einar Grad, MD  09/08/2015 8:26 AM  Nursing:  Gaylan Gerold, RN 09/08/2015 8:26 AM  Clinical Social Worker [name], [place] 09/08/2015 8:26 AM  Other: Tilden Fossa, LCSWA 09/08/2015 8:26 AM  Clinical: Lars Pinks, RN Case Manager 09/08/2015 8:26 AM  Other: , RN Charge Nurse 09/08/2015 8:26 AM  Other:     [name], [place] Social Work [phone]

## 2015-09-09 NOTE — Progress Notes (Signed)
[facility] MD Progress Note  09/09/2015 4:00 PM Nathan Santana  MRN:  [id number]   Subjective:   Patient reports partial improvement.  Denies SI at this time.   He is still depressed and verbalizing thoughts of hopelessness. Denies medication side effects. Objective:  I have discussed case with treatment team and have met with patient. Patient reports feeling better, and states he is not as severely depressed. He does present vaguely depressed, irritable, although he is non disruptive to milieu. Visible on unit, has been attending some groups, limited participation in milieu at this time. No disruptive or agitated behaviors .   Principal Problem: Cocaine dependence with cocaine-induced mood disorder ([place]) Diagnosis:   Patient Active Problem List   Diagnosis Date Noted  . Cocaine dependence with cocaine-induced mood disorder [facility]) [F14.24] 09/07/2015    Priority: High  . Severe episode of recurrent major depressive disorder, without psychotic features ([place]) [F33.2]   . Hyperlipidemia LDL goal <130 [E78.5] 03/01/2015  . Orchalgia [N50.819]   . Testicular pain, right [N50.811]   . HIV (human immunodeficiency virus infection) ([place]) [Z21] 02/05/2015  . CAD (coronary artery disease) [I25.10] 02/05/2015  . Chest pain [R07.9] 02/05/2015  . Suicidal ideations [R45.851] 02/05/2015  . Hypertension [I10] 02/05/2015  . Anxiety [F41.9]   . Pain in the chest [R07.9]   . Depression [F32.9]   . Suicidal ideation [R45.851]    Total Time spent with patient: 20 minutes    Past Medical History:  Past Medical History  Diagnosis Date  . HIV infection ([place])   . Cancer ([place])   . Hypertension   . Angina pectoris ([place])   . Scrotal cyst   . Anxiety   . Depression   . Hypercholesteremia   . Myocardial infarct, old     Past Surgical History  Procedure Laterality Date  . Scrotal turmor removed     Family History:  Family History  Problem Relation Age of Onset  . Hypertension Other   . CAD Sister      Social History:  History  Alcohol Use No    Comment: one beer weekly     History  Drug Use  . Yes  . Special: Marijuana, Cocaine    Comment: Cocaine    Social History   Social History  . Marital Status: Single    Spouse Name: N/A  . Number of Children: 4  . Years of Education: N/A   Social History Main Topics  . Smoking status: Current Some Day Smoker -- 0.50 packs/day    Types: Cigars, Cigarettes  . Smokeless tobacco: None  . Alcohol Use: No     Comment: one beer weekly  . Drug Use: Yes    Special: Marijuana, Cocaine     Comment: Cocaine  . Sexual Activity: No   Other Topics Concern  . None   Social History Narrative   Additional Social History:     Sleep: improved - states too sedated in AM, related to Trazodone   Appetite:  improved   Current Medications: Current Facility-Administered Medications  Medication Dose Route Frequency Provider Last Rate Last Dose  . acetaminophen (TYLENOL) tablet 1,000 mg  1,000 mg Oral Q6H PRN Patrecia Pour, NP   1,000 mg at 09/09/15 1202  . alum & mag hydroxide-simeth (MAALOX/MYLANTA) 200-200-20 MG/5ML suspension 30 mL  30 mL Oral Q4H PRN Patrecia Pour, NP      . amLODipine (NORVASC) tablet 10 mg  10 mg Oral Daily Patrecia Pour, NP  10 mg at 09/09/15 0823  . aspirin EC tablet 81 mg  81 mg Oral Daily Patrecia Pour, NP   81 mg at 09/09/15 2563  . carvedilol (COREG) tablet 25 mg  25 mg Oral BID WC Patrecia Pour, NP   25 mg at 09/09/15 8937  . efavirenz-emtricitabine-tenofovir (ATRIPLA) 600-200-300 MG per tablet 1 tablet  1 tablet Oral QHS Patrecia Pour, NP   1 tablet at 09/08/15 2139  . lisinopril (PRINIVIL,ZESTRIL) tablet 10 mg  10 mg Oral Daily Patrecia Pour, NP   10 mg at 09/09/15 3428  . magnesium hydroxide (MILK OF MAGNESIA) suspension 30 mL  30 mL Oral Daily PRN Patrecia Pour, NP      . nitroGLYCERIN (NITROSTAT) SL tablet 0.4 mg  0.4 mg Sublingual Q5 min PRN Patrecia Pour, NP      . pantoprazole (PROTONIX) EC  tablet 40 mg  40 mg Oral Daily Patrecia Pour, NP   40 mg at 09/09/15 7681  . PARoxetine (PAXIL) tablet 30 mg  30 mg Oral Daily Kerrie Buffalo, NP   30 mg at 09/09/15 1572  . rosuvastatin (CRESTOR) tablet 20 mg  20 mg Oral Daily Patrecia Pour, NP   20 mg at 09/09/15 6203  . zolpidem (AMBIEN) tablet 5 mg  5 mg Oral QHS PRN Patrecia Pour, NP   5 mg at 09/08/15 2139    Lab Results: No results found for this or any previous visit (from the past 48 hour(s)).  Blood Alcohol level:  Lab Results  Component Value Date   ETH <5 09/06/2015   ETH <5 08/16/2015    Physical Findings: AIMS: Facial and Oral Movements Muscles of Facial Expression: None, normal Lips and Perioral Area: None, normal Jaw: None, normal Tongue: None, normal,Extremity Movements Upper (arms, wrists, hands, fingers): None, normal Lower (legs, knees, ankles, toes): None, normal, Trunk Movements Neck, shoulders, hips: None, normal, Overall Severity Severity of abnormal movements (highest score from questions above): None, normal Incapacitation due to abnormal movements: None, normal Patient's awareness of abnormal movements (rate only patient's report): No Awareness, Dental Status Current problems with teeth and/or dentures?: No Does patient usually wear dentures?: No  CIWA:    COWS:     Musculoskeletal: Strength & Muscle Tone: within normal limits Gait & Station: normal Patient leans: N/A  Psychiatric Specialty Exam: Physical Exam  Nursing note and vitals reviewed.   ROS no chest pain, no shortness of breath at room air, no nausea, no vomiting, describes some ankle pain today  Blood pressure 133/91, pulse 69, temperature 98.2 F (36.8 C), temperature source Oral, resp. rate 20, height 5' 9"  (1.753 m), weight 82.555 kg (182 lb).Body mass index is 26.86 kg/(m^2).  General Appearance: Fairly Groomed  Eye Contact:  Good  Speech:  Normal Rate  Volume:  Normal  Mood:  improving, vaguely irritable   Affect:   Appropriate and reactive, subtly irritable at times   Thought Process:  Linear  Orientation:  Full (Time, Place, and Person)  Thought Content:  No hallucinations,no delusions    Suicidal Thoughts:  No  Homicidal Thoughts:  No   Memory:  recent and remote grossly intact   Judgement:  Fair  Insight:  Fair  Psychomotor Activity:  Normal  Concentration:  Concentration: Good and Attention Span: Good  Recall:  Good  Fund of Knowledge:  Good  Language:  Good  Akathisia:  Negative  Handed:  Right  AIMS (if indicated):  Assets:  Communication Skills Desire for Improvement Resilience  ADL's:  Intact  Cognition:  WNL  Sleep:  Number of Hours: 6.25   Treatment Plan Summary: Daily contact with patient to assess and evaluate symptoms and progress in treatment, Medication management, Plan inpatient treatment and medications as below Encourage ongoing group , milieu participation to work on coping skills and symptom reduction Encourage ongoing efforts /focus on recovery, relapse prevention  Continue Atripla for management of HIV Continue Paxil 30 mgrs QDAY for depression, anxiety Start Ibuprofen PRNs for pain, as needed  Continue Protonix to minimize GERD, reflux symptoms Cont Ambien 5 mgrs QHS PRN for insomnia Discused with CSW dispo planning, pending.  Janett Labella, NP [facility] 09/09/2015, 4:00 PM

## 2015-09-09 NOTE — Progress Notes (Signed)
Patient ID: Nathan Santana, male   DOB: Apr 07, 1960, 55 y.o.   MRN: [id number]   Pt currently presents with a flat affect and depressed behavior. Per self inventory, pt rates depression at a 8, hopelessness 7 and anxiety 7. Pt's daily goal is to "stay focus on what I need to do for me" and they intend to do so by "stay positive." Pt reports poor sleep, a fair appetite, low energy and poor concentration. Pt reports feeling "tired and not well" this morning, pt remains in bed and does not attend group.   Pt provided with medications per providers orders. Pt's labs and vitals were monitored throughout the day. Pt supported emotionally and encouraged to express concerns and questions. Pt educated on medications.  Pt's safety ensured with 15 minute and environmental checks. Pt currently denies SI/HI and A/V hallucinations. Pt verbally agrees to seek staff if SI/HI or A/VH occurs and to consult with staff before acting on any harmful thoughts. Will continue POC.

## 2015-09-09 NOTE — Progress Notes (Signed)
Patient attended karaoke group tonight.  

## 2015-09-09 NOTE — Progress Notes (Signed)
Adult Psychoeducational Group Note  Date:  09/09/2015 Time:  1:08 AM  Group Topic/Focus:  Wrap-Up Group:   The focus of this group is to help patients review their daily goal of treatment and discuss progress on daily workbooks.  Participation Level:  Active  Participation Quality:  Appropriate  Affect:  Appropriate  Cognitive:  Alert and Appropriate  Insight: Good  Engagement in Group:  Engaged  Modes of Intervention:  Activity  Additional Comments:  Patient rated his day a 10. Stated his goal was to meet new people and build relationships.  Gloriajean Dell Camy Leder 09/09/2015, 1:08 AM

## 2015-09-09 NOTE — BHH Group Notes (Signed)
[facility] Group Therapy 09/09/2015 1:15pm  Type of Therapy: [facility] Presentation  Pt did not attend, declined invitation.   [name], LCSWA 09/09/2015 1:47 PM

## 2015-09-10 DIAGNOSIS — F1424 Cocaine dependence with cocaine-induced mood disorder: Principal | ICD-10-CM

## 2015-09-10 NOTE — Progress Notes (Signed)
Nathan Santana rates Anxiety and Depression 6-7/10. His goal to day was to "keep my mind focused on getting better every day". Denies SI/HI/AVH. Encouragement and support given. Medications administered as prescribed. Continue to monitor Q 15 minutes for patient safety and medication effectiveness.

## 2015-09-10 NOTE — Progress Notes (Signed)
Patient ID: Nathan Santana, male   DOB: October 10, 1960, 55 y.o.   MRN: [id number]  Pt currently presents with a flat affect and guarded behavior. Per self inventory, pt rates depression at a 8, hopelessness 8 and anxiety 3. 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  Pt provided with medications per providers orders. Pt's labs and vitals were monitored throughout the day. Pt supported emotionally and encouraged to express concerns and questions. Pt educated on medications. Pt's safety ensured with 15 minute and environmental checks. Pt currently denies SI/HI and A/V hallucinations. Pt verbally agrees to seek staff if SI/HI or A/VH occurs and to consult with staff before acting on any harmful thoughts. Pt concerned about finances post discharge. Will continue POC.

## 2015-09-10 NOTE — BHH Group Notes (Signed)
[place] LCSW Group Therapy 09/10/2015 1:15pm  Type of Therapy: Group Therapy- Feelings Around Relapse and Recovery  Pt did not attend, declined invitation.   [name], [place] 09/10/2015 3:28 PM

## 2015-09-10 NOTE — BHH Group Notes (Signed)
[facility] LCSW Aftercare Discharge Planning Group Note  09/10/2015 8:45 AM  Participation Quality: Alert, Appropriate and Oriented  Mood/Affect: Flat  Depression Rating: "so-so"  Anxiety Rating: "so-so"  Thoughts of Suicide: Pt denies SI/HI  Will you contract for safety? Yes  Current AVH: Pt denies  Plan for Discharge/Comments: Pt attended discharge planning group and actively participated in group. CSW discussed suicide prevention education with the group and encouraged them to discuss discharge planning and any relevant barriers. Pt reports that he is feeling some better this morning and is hopeful to stay out of bed more today.   Transportation Means: Pt reports access to transportation  Supports: No supports mentioned at this time  [name], [place] 09/10/2015 9:25 AM
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
## 2015-09-10 NOTE — BHH Suicide Risk Assessment (Signed)
[place] INPATIENT:  Family/Significant Other Suicide Prevention Education  Suicide Prevention Education:  Patient Refusal for Family/Significant Other Suicide Prevention Education: The patient Nathan Santana has refused to provide written consent for family/significant other to be provided Family/Significant Other Suicide Prevention Education during admission and/or prior to discharge.  Physician notified. SPE reviewed with patient and brochure provided. Patient encouraged to return to hospital if having suicidal thoughts, patient verbalized his/her understanding and has no further questions at this time.   Bo Mcclintock 09/10/2015, 9:24 AM

## 2015-09-10 NOTE — Progress Notes (Signed)
[facility] MD Progress Note  09/10/2015 2:11 PM Nathan Santana  MRN:  [id number]   Subjective:   Patient in bed.  Reports little change from yesterday.  Still unsure about how well he will do when discharged.  This causes great anxiety in patient.  Objective:  I have discussed case with treatment team and have met with patient. Patient reports feeling better, and states he is not as severely depressed. He does present vaguely depressed, irritable, although he is non disruptive to milieu. Visible on unit, has been attending some groups, limited participation in milieu at this time. No disruptive or agitated behaviors .   Principal Problem: Cocaine dependence with cocaine-induced mood disorder ([place]) Diagnosis:   Patient Active Problem List   Diagnosis Date Noted  . Cocaine dependence with cocaine-induced mood disorder [facility]) [F14.24] 09/07/2015    Priority: High  . Severe episode of recurrent major depressive disorder, without psychotic features ([place]) [F33.2]   . Hyperlipidemia LDL goal <130 [E78.5] 03/01/2015  . Orchalgia [N50.819]   . Testicular pain, right [N50.811]   . HIV (human immunodeficiency virus infection) ([place]) [Z21] 02/05/2015  . CAD (coronary artery disease) [I25.10] 02/05/2015  . Chest pain [R07.9] 02/05/2015  . Suicidal ideations [R45.851] 02/05/2015  . Hypertension [I10] 02/05/2015  . Anxiety [F41.9]   . Pain in the chest [R07.9]   . Depression [F32.9]   . Suicidal ideation [R45.851]    Total Time spent with patient: 20 minutes  Past Medical History:  Past Medical History  Diagnosis Date  . HIV infection ([place])   . Cancer ([place])   . Hypertension   . Angina pectoris ([place])   . Scrotal cyst   . Anxiety   . Depression   . Hypercholesteremia   . Myocardial infarct, old     Past Surgical History  Procedure Laterality Date  . Scrotal turmor removed     Family History:  Family History  Problem Relation Age of Onset  . Hypertension Other   . CAD Sister      Social History:  History  Alcohol Use No    Comment: one beer weekly     History  Drug Use  . Yes  . Special: Marijuana, Cocaine    Comment: Cocaine    Social History   Social History  . Marital Status: Single    Spouse Name: N/A  . Number of Children: 4  . Years of Education: N/A   Social History Main Topics  . Smoking status: Current Some Day Smoker -- 0.50 packs/day    Types: Cigars, Cigarettes  . Smokeless tobacco: None  . Alcohol Use: No     Comment: one beer weekly  . Drug Use: Yes    Special: Marijuana, Cocaine     Comment: Cocaine  . Sexual Activity: No   Other Topics Concern  . None   Social History Narrative   Additional Social History:   Sleep: improved - states too sedated in AM, related to Trazodone   Appetite:  improved   Current Medications: Current Facility-Administered Medications  Medication Dose Route Frequency Provider Last Rate Last Dose  . acetaminophen (TYLENOL) tablet 1,000 mg  1,000 mg Oral Q6H PRN Patrecia Pour, NP   1,000 mg at 09/09/15 1202  . alum & mag hydroxide-simeth (MAALOX/MYLANTA) 200-200-20 MG/5ML suspension 30 mL  30 mL Oral Q4H PRN Patrecia Pour, NP      . amLODipine (NORVASC) tablet 10 mg  10 mg Oral Daily Patrecia Pour, NP  10 mg at 09/10/15 0836  . aspirin EC tablet 81 mg  81 mg Oral Daily Patrecia Pour, NP   81 mg at 09/10/15 0836  . carvedilol (COREG) tablet 25 mg  25 mg Oral BID WC Patrecia Pour, NP   25 mg at 09/10/15 0836  . efavirenz-emtricitabine-tenofovir (ATRIPLA) 600-200-300 MG per tablet 1 tablet  1 tablet Oral QHS Patrecia Pour, NP   1 tablet at 09/09/15 2235  . lisinopril (PRINIVIL,ZESTRIL) tablet 10 mg  10 mg Oral Daily Patrecia Pour, NP   10 mg at 09/10/15 0836  . magnesium hydroxide (MILK OF MAGNESIA) suspension 30 mL  30 mL Oral Daily PRN Patrecia Pour, NP      . nitroGLYCERIN (NITROSTAT) SL tablet 0.4 mg  0.4 mg Sublingual Q5 min PRN Patrecia Pour, NP      . pantoprazole (PROTONIX) EC  tablet 40 mg  40 mg Oral Daily Patrecia Pour, NP   40 mg at 09/10/15 0836  . PARoxetine (PAXIL) tablet 30 mg  30 mg Oral Daily Kerrie Buffalo, NP   30 mg at 09/10/15 0836  . rosuvastatin (CRESTOR) tablet 20 mg  20 mg Oral Daily Patrecia Pour, NP   20 mg at 09/10/15 0836  . zolpidem (AMBIEN) tablet 5 mg  5 mg Oral QHS PRN Patrecia Pour, NP   5 mg at 09/09/15 2242    Lab Results: No results found for this or any previous visit (from the past 73 hour(s)).  Blood Alcohol level:  Lab Results  Component Value Date   ETH <5 09/06/2015   ETH <5 08/16/2015    Physical Findings: AIMS: Facial and Oral Movements Muscles of Facial Expression: None, normal Lips and Perioral Area: None, normal Jaw: None, normal Tongue: None, normal,Extremity Movements Upper (arms, wrists, hands, fingers): None, normal Lower (legs, knees, ankles, toes): None, normal, Trunk Movements Neck, shoulders, hips: None, normal, Overall Severity Severity of abnormal movements (highest score from questions above): None, normal Incapacitation due to abnormal movements: None, normal Patient's awareness of abnormal movements (rate only patient's report): No Awareness, Dental Status Current problems with teeth and/or dentures?: No Does patient usually wear dentures?: No  CIWA:    COWS:     Musculoskeletal: Strength & Muscle Tone: within normal limits Gait & Station: normal Patient leans: N/A  Psychiatric Specialty Exam: Physical Exam  Nursing note and vitals reviewed. Psychiatric: His mood appears anxious. He exhibits a depressed mood.    Review of Systems  Psychiatric/Behavioral: Positive for depression. The patient is nervous/anxious.    no chest pain, no shortness of breath at room air, no nausea, no vomiting, describes some ankle pain today  Blood pressure 136/86, pulse 66, temperature 98.1 F (36.7 C), temperature source Oral, resp. rate 18, height 5' 9"  (1.753 m), weight 82.555 kg (182 lb).Body mass index is  26.86 kg/(m^2).  General Appearance: Fairly Groomed  Eye Contact:  Good  Speech:  Normal Rate  Volume:  Normal  Mood:  Depressed and Hopeless  Affect:  Appropriate and reactive, smiles at times  Thought Process:  Linear  Orientation:  Full (Time, Place, and Person)  Thought Content:  No hallucinations,no delusions    Suicidal Thoughts:  No  Homicidal Thoughts:  No   Memory:  recent and remote grossly intact   Judgement:  Fair  Insight:  Fair  Psychomotor Activity:  Normal  Concentration:  Concentration: Good and Attention Span: Good  Recall:  Good  Fund of  Knowledge:  Good  Language:  Good  Akathisia:  Negative  Handed:  Right  AIMS (if indicated):     Assets:  Communication Skills Desire for Improvement Resilience  ADL's:  Intact  Cognition:  WNL  Sleep:  Number of Hours: 5.75   Treatment Plan Summary: Daily contact with patient to assess and evaluate symptoms and progress in treatment, Medication management, Plan inpatient treatment and medications as below Encourage ongoing group , milieu participation to work on coping skills and symptom reduction Encourage ongoing efforts /focus on recovery, relapse prevention  Continue Atripla for management of HIV Continue Paxil 30 mgrs QDAY for depression, anxiety Start Ibuprofen PRNs for pain, as needed  Continue Protonix to minimize GERD, reflux symptoms Cont Ambien 5 mgrs QHS PRN for insomnia Discused with CSW dispo planning, pending. Dr Einar Grad present and concurs with plan of care.    Janett Labella, NP [facility] 09/10/2015, 2:11 PM

## 2015-09-11 MED ORDER — BENZOCAINE 10 % MT GEL
Freq: Four times a day (QID) | OROMUCOSAL | Status: DC | PRN
  Administered 2015-09-13: 17:00:00 via OROMUCOSAL
  Filled 2015-09-11: qty 9.4

## 2015-09-11 NOTE — Progress Notes (Signed)
[facility] MD Progress Note  09/11/2015 5:13 PM Nathan Santana  MRN:  [id number]   Subjective:   "I'm about the same as yesterday.  Still anxious." Objective:  I have discussed case with treatment team and have met with patient. Patient reports feeling better, and states he is not as severely depressed. He does present vaguely depressed, irritable, although he is non disruptive to milieu.  Does tend to sleep during the day. Visible on unit, has been attending some groups, limited participation in milieu at this time. No disruptive or agitated behaviors .   Principal Problem: Cocaine dependence with cocaine-induced mood disorder ([place]) Diagnosis:   Patient Active Problem List   Diagnosis Date Noted  . Cocaine dependence with cocaine-induced mood disorder [facility]) [F14.24] 09/07/2015    Priority: High  . Severe episode of recurrent major depressive disorder, without psychotic features ([place]) [F33.2]   . Hyperlipidemia LDL goal <130 [E78.5] 03/01/2015  . Orchalgia [N50.819]   . Testicular pain, right [N50.811]   . HIV (human immunodeficiency virus infection) ([place]) [Z21] 02/05/2015  . CAD (coronary artery disease) [I25.10] 02/05/2015  . Chest pain [R07.9] 02/05/2015  . Suicidal ideations [R45.851] 02/05/2015  . Hypertension [I10] 02/05/2015  . Anxiety [F41.9]   . Pain in the chest [R07.9]   . Depression [F32.9]   . Suicidal ideation [R45.851]    Total Time spent with patient: 20 minutes  Past Medical History:  Past Medical History  Diagnosis Date  . HIV infection ([place])   . Cancer ([place])   . Hypertension   . Angina pectoris ([place])   . Scrotal cyst   . Anxiety   . Depression   . Hypercholesteremia   . Myocardial infarct, old     Past Surgical History  Procedure Laterality Date  . Scrotal turmor removed     Family History:  Family History  Problem Relation Age of Onset  . Hypertension Other   . CAD Sister     Social History:  History  Alcohol Use No    Comment: one beer weekly      History  Drug Use  . Yes  . Special: Marijuana, Cocaine    Comment: Cocaine    Social History   Social History  . Marital Status: Single    Spouse Name: N/A  . Number of Children: 4  . Years of Education: N/A   Social History Main Topics  . Smoking status: Current Some Day Smoker -- 0.50 packs/day    Types: Cigars, Cigarettes  . Smokeless tobacco: None  . Alcohol Use: No     Comment: one beer weekly  . Drug Use: Yes    Special: Marijuana, Cocaine     Comment: Cocaine  . Sexual Activity: No   Other Topics Concern  . None   Social History Narrative   Additional Social History:   Sleep: improved - states too sedated in AM, related to Trazodone   Appetite:  improved   Current Medications: Current Facility-Administered Medications  Medication Dose Route Frequency Provider Last Rate Last Dose  . acetaminophen (TYLENOL) tablet 1,000 mg  1,000 mg Oral Q6H PRN Patrecia Pour, NP   1,000 mg at 09/09/15 1202  . alum & mag hydroxide-simeth (MAALOX/MYLANTA) 200-200-20 MG/5ML suspension 30 mL  30 mL Oral Q4H PRN Patrecia Pour, NP      . amLODipine (NORVASC) tablet 10 mg  10 mg Oral Daily Patrecia Pour, NP   10 mg at 09/11/15 0814  . aspirin EC tablet 81  mg  81 mg Oral Daily Patrecia Pour, NP   81 mg at 09/11/15 3151  . benzocaine (ORAJEL) 10 % mucosal gel   Mouth/Throat QID PRN Kerrie Buffalo, NP      . carvedilol (COREG) tablet 25 mg  25 mg Oral BID WC Patrecia Pour, NP   25 mg at 09/11/15 1701  . efavirenz-emtricitabine-tenofovir (ATRIPLA) 600-200-300 MG per tablet 1 tablet  1 tablet Oral QHS Patrecia Pour, NP   1 tablet at 09/10/15 2305  . lisinopril (PRINIVIL,ZESTRIL) tablet 10 mg  10 mg Oral Daily Patrecia Pour, NP   10 mg at 09/11/15 0814  . magnesium hydroxide (MILK OF MAGNESIA) suspension 30 mL  30 mL Oral Daily PRN Patrecia Pour, NP      . nitroGLYCERIN (NITROSTAT) SL tablet 0.4 mg  0.4 mg Sublingual Q5 min PRN Patrecia Pour, NP      . pantoprazole (PROTONIX)  EC tablet 40 mg  40 mg Oral Daily Patrecia Pour, NP   40 mg at 09/11/15 0814  . PARoxetine (PAXIL) tablet 30 mg  30 mg Oral Daily Kerrie Buffalo, NP   30 mg at 09/11/15 0814  . rosuvastatin (CRESTOR) tablet 20 mg  20 mg Oral Daily Patrecia Pour, NP   20 mg at 09/11/15 7616  . zolpidem (AMBIEN) tablet 5 mg  5 mg Oral QHS PRN Patrecia Pour, NP   5 mg at 09/10/15 2305    Lab Results: No results found for this or any previous visit (from the past 73 hour(s)).  Blood Alcohol level:  Lab Results  Component Value Date   ETH <5 09/06/2015   ETH <5 08/16/2015    Physical Findings: AIMS: Facial and Oral Movements Muscles of Facial Expression: None, normal Lips and Perioral Area: None, normal Jaw: None, normal Tongue: None, normal,Extremity Movements Upper (arms, wrists, hands, fingers): None, normal Lower (legs, knees, ankles, toes): None, normal, Trunk Movements Neck, shoulders, hips: None, normal, Overall Severity Severity of abnormal movements (highest score from questions above): None, normal Incapacitation due to abnormal movements: None, normal Patient's awareness of abnormal movements (rate only patient's report): No Awareness, Dental Status Current problems with teeth and/or dentures?: No Does patient usually wear dentures?: No  CIWA:    COWS:     Musculoskeletal: Strength & Muscle Tone: within normal limits Gait & Station: normal Patient leans: N/A  Psychiatric Specialty Exam: Physical Exam  Nursing note and vitals reviewed. Psychiatric: His mood appears anxious. He exhibits a depressed mood.    Review of Systems  Psychiatric/Behavioral: Positive for depression. The patient is nervous/anxious.    no chest pain, no shortness of breath at room air, no nausea, no vomiting, describes some ankle pain today  Blood pressure 152/79, pulse 63, temperature 98.3 F (36.8 C), temperature source Oral, resp. rate 17, height 5' 9"  (1.753 m), weight 82.555 kg (182 lb).Body mass index  is 26.86 kg/(m^2).  General Appearance: Fairly Groomed  Eye Contact:  Good  Speech:  Normal Rate  Volume:  Normal  Mood:  Depressed and Hopeless  Affect:  Appropriate and reactive, smiles at times  Thought Process:  Linear  Orientation:  Full (Time, Place, and Person)  Thought Content:  No hallucinations,no delusions    Suicidal Thoughts:  No  Homicidal Thoughts:  No   Memory:  recent and remote grossly intact   Judgement:  Fair  Insight:  Fair  Psychomotor Activity:  Normal  Concentration:  Concentration: Good and Attention  Span: Good  Recall:  Good  Fund of Knowledge:  Good  Language:  Good  Akathisia:  Negative  Handed:  Right  AIMS (if indicated):     Assets:  Communication Skills Desire for Improvement Resilience  ADL's:  Intact  Cognition:  WNL  Sleep:  Number of Hours: 6.25   Treatment Plan Summary: Daily contact with patient to assess and evaluate symptoms and progress in treatment, Medication management, Plan inpatient treatment and medications as below Encourage ongoing group , milieu participation to work on coping skills and symptom reduction Encourage ongoing efforts /focus on recovery, relapse prevention  Continue Atripla for management of HIV Continue Paxil 30 mgrs QDAY for depression, anxiety Start Ibuprofen PRNs for pain, as needed  Continue Protonix to minimize GERD, reflux symptoms Cont Ambien 5 mgrs QHS PRN for insomnia Discused with CSW dispo planning, pending.   Janett Labella, NP [facility] 09/11/2015, 5:13 PM  Reviewed the information documented and agree with the treatment plan.  [facility] 09/12/2015 2:33 PM

## 2015-09-11 NOTE — BHH Group Notes (Signed)
[place] LCSW Group Therapy  09/11/2015 10:00 AM  Type of Therapy:  Group Therapy  Participation Level:  Did Not Attend    Christene Lye 09/11/2015, 12:54 PM

## 2015-09-11 NOTE — BHH Group Notes (Signed)
[place] Group Notes:  (Nursing/MHT/Case Management/Adjunct)  Date:  09/11/2015  Time:  9:45 AM  Type of Therapy:  Nurse Education  Participation Level:  Did Not Attend  Summary of Progress/Problems: Did not attend despite invitation  Marya Landry 09/11/2015, 9:45 AM

## 2015-09-11 NOTE — Progress Notes (Signed)
D: Yakir appeared depressed this a.m. He denied SI/HI/AVH but reported right-sided toothache of 6/10. On his self inventory, he rated his depression, anxiety, and feelings of hopelessness all 8/10. He reported fair sleep, fair appetite, low energy level, and poor concentration. He said he feel like being around people today and "all the foolishness." He did not attend a.m group and has remained in his room for most of the morning.  A: Meds given as ordered. Order obtained for [place]. Q15 safety checks maintained. Support/encouragement offered. R: Pt remains free from harm and continues with treatment. Will continue to monitor for needs/safety.

## 2015-09-11 NOTE — Progress Notes (Signed)
Carry rates Anxiety 7/10 and Depression 2/10. He is experiencing some dental pain 9.5/10. He states he has been very upset today because he knows his truck windows are down and it has been raining on and off inside his truck which has been repossessed. His goal today is "to continue to work hard on being discharged soon". Denies SI/HI/AVH. Encouragement and support given. Medications administered as prescribed. Continue to monitor Q 15 minutes for patient safety and medication effectiveness.

## 2015-09-12 NOTE — BHH Group Notes (Signed)
[place] Group Notes:  (Nursing/MHT/Case Management/Adjunct)  Date:  09/12/2015  Time:  12:20 PM   Type of Therapy:  Psychoeducational Skills  Participation Level:  Active  Participation Quality:  Appropriate  Affect:  Appropriate  Cognitive:  Appropriate  Insight:  Appropriate  Engagement in Group:  Engaged  Modes of Intervention:  Problem-solving  Summary of Progress/Problems:  Group encouraged to surround themselves with positive and healthy group/support system when changing to a healthy life style.    Wolfgang Phoenix 09/12/2015, 12:20 PM

## 2015-09-12 NOTE — Progress Notes (Signed)
[facility] MD Progress Note  09/12/2015 4:53 PM Nathan Santana  MRN:  [id number]   Subjective:   Patient states that just needs to get a break from bad luck.    Objective:  I have discussed case with treatment team and have met with patient. Patient reports feeling better, and states he is not as severely depressed. He does present vaguely depressed, irritable, although he is non disruptive to milieu. Sleeps during the day. Visible on unit, has been attending some groups, limited participation in milieu at this time. No disruptive or agitated behaviors .   Principal Problem: Cocaine dependence with cocaine-induced mood disorder ([place]) Diagnosis:   Patient Active Problem List   Diagnosis Date Noted  . Cocaine dependence with cocaine-induced mood disorder [facility]) [F14.24] 09/07/2015    Priority: High  . Severe episode of recurrent major depressive disorder, without psychotic features ([place]) [F33.2]   . Hyperlipidemia LDL goal <130 [E78.5] 03/01/2015  . Orchalgia [N50.819]   . Testicular pain, right [N50.811]   . HIV (human immunodeficiency virus infection) ([place]) [Z21] 02/05/2015  . CAD (coronary artery disease) [I25.10] 02/05/2015  . Chest pain [R07.9] 02/05/2015  . Suicidal ideations [R45.851] 02/05/2015  . Hypertension [I10] 02/05/2015  . Anxiety [F41.9]   . Pain in the chest [R07.9]   . Depression [F32.9]   . Suicidal ideation [R45.851]    Total Time spent with patient: 20 minutes  Past Medical History:  Past Medical History  Diagnosis Date  . HIV infection ([place])   . Cancer ([place])   . Hypertension   . Angina pectoris ([place])   . Scrotal cyst   . Anxiety   . Depression   . Hypercholesteremia   . Myocardial infarct, old     Past Surgical History  Procedure Laterality Date  . Scrotal turmor removed     Family History:  Family History  Problem Relation Age of Onset  . Hypertension Other   . CAD Sister     Social History:  History  Alcohol Use No    Comment: one beer weekly      History  Drug Use  . Yes  . Special: Marijuana, Cocaine    Comment: Cocaine    Social History   Social History  . Marital Status: Single    Spouse Name: N/A  . Number of Children: 4  . Years of Education: N/A   Social History Main Topics  . Smoking status: Current Some Day Smoker -- 0.50 packs/day    Types: Cigars, Cigarettes  . Smokeless tobacco: None  . Alcohol Use: No     Comment: one beer weekly  . Drug Use: Yes    Special: Marijuana, Cocaine     Comment: Cocaine  . Sexual Activity: No   Other Topics Concern  . None   Social History Narrative   Additional Social History:   Sleep: improved - states too sedated in AM, related to Trazodone   Appetite:  improved   Current Medications: Current Facility-Administered Medications  Medication Dose Route Frequency Provider Last Rate Last Dose  . acetaminophen (TYLENOL) tablet 1,000 mg  1,000 mg Oral Q6H PRN Patrecia Pour, NP   1,000 mg at 09/12/15 0835  . alum & mag hydroxide-simeth (MAALOX/MYLANTA) 200-200-20 MG/5ML suspension 30 mL  30 mL Oral Q4H PRN Patrecia Pour, NP      . amLODipine (NORVASC) tablet 10 mg  10 mg Oral Daily Patrecia Pour, NP   10 mg at 09/12/15 0836  . aspirin EC  tablet 81 mg  81 mg Oral Daily Patrecia Pour, NP   81 mg at 09/12/15 0836  . benzocaine (ORAJEL) 10 % mucosal gel   Mouth/Throat QID PRN Kerrie Buffalo, NP      . carvedilol (COREG) tablet 25 mg  25 mg Oral BID WC Patrecia Pour, NP   25 mg at 09/12/15 0837  . efavirenz-emtricitabine-tenofovir (ATRIPLA) 600-200-300 MG per tablet 1 tablet  1 tablet Oral QHS Patrecia Pour, NP   1 tablet at 09/11/15 2212  . lisinopril (PRINIVIL,ZESTRIL) tablet 10 mg  10 mg Oral Daily Patrecia Pour, NP   10 mg at 09/12/15 0836  . magnesium hydroxide (MILK OF MAGNESIA) suspension 30 mL  30 mL Oral Daily PRN Patrecia Pour, NP      . nitroGLYCERIN (NITROSTAT) SL tablet 0.4 mg  0.4 mg Sublingual Q5 min PRN Patrecia Pour, NP   0.4 mg at 09/12/15 0836  .  pantoprazole (PROTONIX) EC tablet 40 mg  40 mg Oral Daily Patrecia Pour, NP   40 mg at 09/12/15 0837  . PARoxetine (PAXIL) tablet 30 mg  30 mg Oral Daily Kerrie Buffalo, NP   30 mg at 09/12/15 0836  . rosuvastatin (CRESTOR) tablet 20 mg  20 mg Oral Daily Patrecia Pour, NP   20 mg at 09/12/15 0836  . zolpidem (AMBIEN) tablet 5 mg  5 mg Oral QHS PRN Patrecia Pour, NP   5 mg at 09/11/15 2212    Lab Results: No results found for this or any previous visit (from the past 1 hour(s)).  Blood Alcohol level:  Lab Results  Component Value Date   ETH <5 09/06/2015   ETH <5 08/16/2015    Physical Findings: AIMS: Facial and Oral Movements Muscles of Facial Expression: None, normal Lips and Perioral Area: None, normal Jaw: None, normal Tongue: None, normal,Extremity Movements Upper (arms, wrists, hands, fingers): None, normal Lower (legs, knees, ankles, toes): None, normal, Trunk Movements Neck, shoulders, hips: None, normal, Overall Severity Severity of abnormal movements (highest score from questions above): None, normal Incapacitation due to abnormal movements: None, normal Patient's awareness of abnormal movements (rate only patient's report): No Awareness, Dental Status Current problems with teeth and/or dentures?: No Does patient usually wear dentures?: No  CIWA:    COWS:     Musculoskeletal: Strength & Muscle Tone: within normal limits Gait & Station: normal Patient leans: N/A  Psychiatric Specialty Exam: Physical Exam  Nursing note and vitals reviewed. Psychiatric: His mood appears anxious. He exhibits a depressed mood.    Review of Systems  Psychiatric/Behavioral: Positive for depression. The patient is nervous/anxious.    no chest pain, no shortness of breath at room air, no nausea, no vomiting, describes some ankle pain today  Blood pressure 111/74, pulse 58, temperature 98.7 F (37.1 C), temperature source Oral, resp. rate 16, height _0  (1.753 m), weight 82.555 kg  (182 lb).Body mass index is 26.86 kg/(m^2).  General Appearance: Fairly Groomed  Eye Contact:  Good  Speech:  Normal Rate  Volume:  Normal  Mood:  Depressed and Hopeless  Affect:  Appropriate and reactive, smiles at times  Thought Process:  Linear  Orientation:  Full (Time, Place, and Person)  Thought Content:  No hallucinations,no delusions    Suicidal Thoughts:  No  Homicidal Thoughts:  No   Memory:  recent and remote grossly intact   Judgement:  Fair  Insight:  Fair  Psychomotor Activity:  Normal  Concentration:  Concentration: Good and Attention Span: Good  Recall:  Good  Fund of Knowledge:  Good  Language:  Good  Akathisia:  Negative  Handed:  Right  AIMS (if indicated):     Assets:  Communication Skills Desire for Improvement Resilience  ADL's:  Intact  Cognition:  WNL  Sleep:  Number of Hours: 6.25   Treatment Plan Summary: Daily contact with patient to assess and evaluate symptoms and progress in treatment, Medication management, Plan inpatient treatment and medications as below   Encourage ongoing group , milieu participation to work on Radiographer, therapeutic and symptom reduction Encourage ongoing efforts /focus on recovery, relapse prevention  Continue Atripla for management of HIV Continue Paxil 30 mgrs QDAY for depression, anxiety Start Ibuprofen PRNs for pain, as needed  Continue Protonix to minimize GERD, reflux symptoms Cont Ambien 5 mgrs QHS PRN for insomnia Discused with CSW dispo planning, pending.   Janett Labella, NP [facility] 09/12/2015, 4:53 PM  Reviewed the information documented and agree with the treatment plan.  Shailene Demonbreun [facility] 09/15/2015 12:45 PM

## 2015-09-12 NOTE — BHH Group Notes (Signed)
[place] LCSW Group Therapy  09/12/2015 10:00 AM  Type of Therapy:  Group Therapy  Participation Level:  Active  Participation Quality:  Appropriate and Attentive  Affect:  Appropriate  Cognitive:  Alert and Oriented  Insight:  Improving  Engagement in Therapy:  Improving  Modes of Intervention:  Discussion  Summary of Progress/Problems: Group began discussing negative self talk. However, patient's identified that had discussed negative self talk. Patient's identified that previous group was about negative self talk. This clinician then asked about their individual plans to address negative self talk. Patients identified need for specific plan to accomplish this goal. Group began providing details for goals identified by each participant. Patients eager to provide encouragement, support and goal plans. Patient identified that he wanted to change the people he spent time with. Patient was encouraged by group to identify concrete plan to change social networks and was appreciative of feedback.   Christene Lye 09/12/2015, 3:13 PM

## 2015-09-12 NOTE — Progress Notes (Signed)
DAR NOTE: Pt present with depressed mood and flat affect. Pt has been present in the milieu, attended group and participated. Pt complained of tooth ache, tylenol 1000 mg given with good relief. Pt goal for today is " preparing for a better day."  As per self inventory, pt had poor sleep last night, fair appetite, low energy, and poor concentration. Pt rates depression at 6, hopelessness at 6, and anxiety at 6. Pt's safety ensured with 15 minute and environmental checks. Pt currently denies SI/HI and A/V hallucinations. Pt verbally agrees to seek staff if SI/HI or A/VH occurs and to consult with staff before acting on any harmful thoughts. Will continue POC.

## 2015-09-13 NOTE — Care Management Utilization Note (Signed)
   Per State Regulation 482.30  This chart was reviewed for necessity with respect to the patient's Admission/ Duration of stay.  Next review date: 09/15/15  Skipper Cliche RN, BSN

## 2015-09-13 NOTE — BHH Group Notes (Signed)
[place] LCSW Group Therapy  09/13/2015 1:15pm  Type of Therapy:  Group Therapy vercoming Obstacles  Pt did not attend, declined invitation.   [name], [place] 09/13/2015 3:02 PM

## 2015-09-13 NOTE — Progress Notes (Signed)
Recreation Therapy Notes  Date: 06.26.2017 Time: 9:30am Location: [address] Group Room   Group Topic: Stress Management  Goal Area(s) Addresses:  Patient will actively participate in stress management techniques presented during session.   Behavioral Response: Did not attend.   Laureen Ochs Male[place], LRT/CTRS        Danile Trier L 09/13/2015 10:22 AM

## 2015-09-13 NOTE — Progress Notes (Signed)
Adult Psychoeducational Group Note  Date:  09/13/2015 Time:  9:40 PM  Group Topic/Focus:  Wrap-Up Group:   The focus of this group is to help patients review their daily goal of treatment and discuss progress on daily workbooks.  Participation Level:  Active  Participation Quality:  Appropriate  Affect:  Appropriate  Cognitive:  Appropriate  Insight: Good  Engagement in Group:  Engaged  Modes of Intervention:  Activity  Additional Comments:  Patient rated his day a 3. Stated he had a bad toothache and goal is to get through circumstances.  Donato Heinz 09/13/2015, 9:40 PM

## 2015-09-13 NOTE — BHH Group Notes (Signed)
[facility] LCSW Aftercare Discharge Planning Group Note  09/13/2015 8:45 AM  Pt did not attend, declined invitation.    [name], [place] 09/13/2015 10:38 AM

## 2015-09-13 NOTE — Progress Notes (Addendum)
DAR NOTE: Pt present with flat affect and depressed mood in the unit. Pt has been been in the bed most of am, pt complained of not sleeping well last night. Pt complained of right upper tooth ache, took all his meds as scheduled. As per self inventory, pt had a poor night sleep, fair appetite, low energy, and poor concentration. Pt rate depression at 8, hopeless ness at 8, and anxiety at 8. Pt's safety ensured with 15 minute and environmental checks. Pt currently denies SI/HI and A/V hallucinations. Pt verbally agrees to seek staff if SI/HI or A/VH occurs and to consult with staff before acting on these thoughts. Will continue POC.

## 2015-09-13 NOTE — Progress Notes (Signed)
D. Pt had been in room most of the evening, did come out for some socialization. Nathan Santana spoke about how he is continuing to feel depressed and not sleeping all that well and spoke about how he feels he needs to go to a longer term facility and spoke about how he needs to focus on himself and to get himself right. Nathan Santana did receive bedtime medications without incident. A. Support and encouragement provided. R. Safety maintained, will continue to monitor.

## 2015-09-13 NOTE — Tx Team (Signed)
Interdisciplinary Treatment Plan Update (Adult) Date: 09/13/2015   Date: 09/13/2015 10:38 AM  Progress in Treatment:  Attending groups: Intermittently Participating in groups: Yes Taking medication as prescribed: Yes  Tolerating medication: Yes  Family/Significant othe contact made: No, CSW assessing for appropriate contact Patient understands diagnosis: Yes AEB seeking help with depression  Discussing patient identified problems/goals with staff: Yes  Medical problems stabilized or resolved: Yes  Denies suicidal/homicidal ideation: Yes Patient has not harmed self or Others: Yes   New problem(s) identified: None identified at this time.   Discharge Plan or Barriers: CSW will assess for appropriate discharge plan and relevant barriers.   09/13/15: Discharge plan still uncertain; Pt will follow-up with [place]  Additional comments:  Patient and CSW reviewed pt's identified goals and treatment plan. Patient verbalized understanding and agreed to treatment plan. CSW reviewed [facility] "Discharge Process and Patient Involvement" Form. Pt verbalized understanding of information provided and signed form.   Reason for Continuation of Hospitalization:  Depression Medication stabilization Suicidal ideation  Estimated length of stay: 2-3 days  Review of initial/current patient goals per problem list:   1.  Goal(s): Patient will participate in aftercare plan  Met:  No  Target date: 3-5 days from date of admission   As evidenced by: Patient will participate within aftercare plan AEB aftercare provider and housing plan at discharge being identified.  09/08/15: CSW to work with Pt to assess for appropriate discharge plan and faciliate appointments and referrals as needed prior to d/c. 09/13/15:  Discharge plan still uncertain; Pt will follow-up with [place]  2.  Goal (s): Patient will exhibit decreased depressive symptoms and suicidal ideations.  Met:   No  Target date: 3-5 days from date of admission   As evidenced by: Patient will utilize self rating of depression at 3 or below and demonstrate decreased signs of depression or be deemed stable for discharge by MD. 09/08/15: Pt was admitted with symptoms of depression, rating 10/10. Pt continues to present with flat affect and depressive symptoms.  Pt will demonstrate decreased symptoms of depression and rate depression at 3/10 or lower prior to discharge. 09/13/15: Pt rates depression at 8/10  Attendees:  Patient:    Family:    Physician: Dr. Shea Evans, MD  09/13/2015 10:38 AM  Nursing:  Eulogio Bear, RN; Marcella Dubs, RN 09/13/2015 10:38 AM  Clinical Social Worker [name], [place] 09/13/2015 10:38 AM  Other: Tilden Fossa, [place] 09/13/2015 10:38 AM  Clinical: 09/13/2015 10:38 AM  Other:  09/13/2015 10:38 AM  Other:     [name], [place] Social Work [phone]

## 2015-09-13 NOTE — Progress Notes (Signed)
Patient ID: Nathan Santana, male   DOB: 1960-06-20, 55 y.o.   MRN: [id number] [facility] MD Progress Note  09/13/2015 3:06 PM Thomes Corpe  MRN:  [id number]   Subjective: Nathan Santana reports, "Today, I'm doing so-so emotionally, but doing well on my medicines. I just had a lot in my mind. A lot going on for me out there. Trying to figure out what to do once I get out of here".  Objective: Nathan Santana is seen & chart is reviewed. He is reporting that he is doing so-so with the symptoms of his depression. He adds that he still has a lot going on emotionally for him due to his situation on the outside after he gets discharged. He says his truck was pounded by the state & sold in auction recently. He says he is not sleeping well at night He was cooperative with assessment and appears to be interacting well on the unit. He denies any adverse reactions to medications and is compliant with his regimen. He currently continue to present with vague suicidal ideations.  Principal Problem: Cocaine dependence with cocaine-induced mood disorder ([place])  Diagnosis:   Patient Active Problem List   Diagnosis Date Noted  . Cocaine dependence with cocaine-induced mood disorder ([place]) [F14.24] 09/07/2015  . Severe episode of recurrent major depressive disorder, without psychotic features (Nathan Santana) [F33.2]   . Hyperlipidemia LDL goal <130 [E78.5] 03/01/2015  . Orchalgia [N50.819]   . Testicular pain, right [N50.811]   . HIV (human immunodeficiency virus infection) ([place]) [Z21] 02/05/2015  . CAD (coronary artery disease) [I25.10] 02/05/2015  . Chest pain [R07.9] 02/05/2015  . Suicidal ideations [R45.851] 02/05/2015  . Hypertension [I10] 02/05/2015  . Anxiety [F41.9]   . Pain in the chest [R07.9]   . Depression [F32.9]   . Suicidal ideation [R45.851]    Total Time spent with patient: 15 minutes  Past Medical History:  Past Medical History  Diagnosis Date  . HIV infection ([place])   . Cancer ([place])   . Hypertension   .  Angina pectoris ([place])   . Scrotal cyst   . Anxiety   . Depression   . Hypercholesteremia   . Myocardial infarct, old     Past Surgical History  Procedure Laterality Date  . Scrotal turmor removed     Family History:  Family History  Problem Relation Age of Onset  . Hypertension Other   . CAD Sister     Social History:  History  Alcohol Use No    Comment: one beer weekly     History  Drug Use  . Yes  . Special: Marijuana, Cocaine    Comment: Cocaine    Social History   Social History  . Marital Status: Single    Spouse Name: N/A  . Number of Children: 4  . Years of Education: N/A   Social History Main Topics  . Smoking status: Current Some Day Smoker -- 0.50 packs/day    Types: Cigars, Cigarettes  . Smokeless tobacco: None  . Alcohol Use: No     Comment: one beer weekly  . Drug Use: Yes    Special: Marijuana, Cocaine     Comment: Cocaine  . Sexual Activity: No   Other Topics Concern  . None   Social History Narrative   Additional Social History:   Sleep: Poor-(encouraged to ask for his Ambien prior to going to bed at night).  Appetite:  improved   Current Medications: Current Facility-Administered Medications  Medication Dose Route Frequency Provider Last Rate  Last Dose  . acetaminophen (TYLENOL) tablet 1,000 mg  1,000 mg Oral Q6H PRN Patrecia Pour, NP   1,000 mg at 09/13/15 [id number]  . alum & mag hydroxide-simeth (MAALOX/MYLANTA) 200-200-20 MG/5ML suspension 30 mL  30 mL Oral Q4H PRN Patrecia Pour, NP      . amLODipine (NORVASC) tablet 10 mg  10 mg Oral Daily Patrecia Pour, NP   10 mg at 09/13/15 [id number]  . aspirin EC tablet 81 mg  81 mg Oral Daily Patrecia Pour, NP   81 mg at 09/13/15 [id number]  . benzocaine (ORAJEL) 10 % mucosal gel   Mouth/Throat QID PRN Kerrie Buffalo, NP      . carvedilol (COREG) tablet 25 mg  25 mg Oral BID WC Patrecia Pour, NP   25 mg at 09/13/15 [id number]  . efavirenz-emtricitabine-tenofovir (ATRIPLA) 600-200-300 MG per tablet 1 tablet   1 tablet Oral QHS Patrecia Pour, NP   1 tablet at 09/12/15 2140  . lisinopril (PRINIVIL,ZESTRIL) tablet 10 mg  10 mg Oral Daily Patrecia Pour, NP   10 mg at 09/13/15 [id number]  . magnesium hydroxide (MILK OF MAGNESIA) suspension 30 mL  30 mL Oral Daily PRN Patrecia Pour, NP      . nitroGLYCERIN (NITROSTAT) SL tablet 0.4 mg  0.4 mg Sublingual Q5 min PRN Patrecia Pour, NP   0.4 mg at 09/12/15 0836  . pantoprazole (PROTONIX) EC tablet 40 mg  40 mg Oral Daily Patrecia Pour, NP   40 mg at 09/13/15 [id number]  . PARoxetine (PAXIL) tablet 30 mg  30 mg Oral Daily Kerrie Buffalo, NP   30 mg at 09/13/15 [id number]  . rosuvastatin (CRESTOR) tablet 20 mg  20 mg Oral Daily Patrecia Pour, NP   20 mg at 09/13/15 [id number]  . zolpidem (AMBIEN) tablet 5 mg  5 mg Oral QHS PRN Patrecia Pour, NP   5 mg at 09/11/15 2212    Lab Results: No results found for this or any previous visit (from the past 58 hour(s)).  Blood Alcohol level:  Lab Results  Component Value Date   ETH <5 09/06/2015   ETH <5 08/16/2015   Physical Findings: AIMS: Facial and Oral Movements Muscles of Facial Expression: None, normal Lips and Perioral Area: None, normal Jaw: None, normal Tongue: None, normal,Extremity Movements Upper (arms, wrists, hands, fingers): None, normal Lower (legs, knees, ankles, toes): None, normal, Trunk Movements Neck, shoulders, hips: None, normal, Overall Severity Severity of abnormal movements (highest score from questions above): None, normal Incapacitation due to abnormal movements: None, normal Patient's awareness of abnormal movements (rate only patient's report): No Awareness, Dental Status Current problems with teeth and/or dentures?: No Does patient usually wear dentures?: No  CIWA:    COWS:     Musculoskeletal: Strength & Muscle Tone: within normal limits Gait & Station: normal Patient leans: N/A  Psychiatric Specialty Exam: Physical Exam  Nursing note and vitals reviewed. Psychiatric: His mood appears  anxious. He exhibits a depressed mood.    Review of Systems  Psychiatric/Behavioral: Positive for depression. The patient is nervous/anxious.    No chest pain, no shortness of breath at room air, no nausea, no vomiting, describes some ankle pain today  Blood pressure 151/81, pulse 73, temperature 98.1 F (36.7 C), temperature source Oral, resp. rate 16, height 5\' 9"  (1.753 m), weight 82.555 kg (182 lb).Body mass index is 26.86 kg/(m^2).  General Appearance: Fairly Groomed  Eye Contact:  Good  Speech:  Normal Rate  Volume:  Normal  Mood:  Depressed and Hopeless  Affect:  Appropriate and reactive, smiles at times  Thought Process:  Linear  Orientation:  Full (Time, Place, and Person)  Thought Content:  Denies any hallucinations, delusions or paranoia.  Suicidal Thoughts:  "Just fleeting thoughts"  Homicidal Thoughts:  No   Memory:  recent and remote grossly intact   Judgement:  Fair  Insight:  Fair  Psychomotor Activity:  Normal  Concentration:  Concentration: Good and Attention Span: Good  Recall:  Good  Fund of Knowledge:  Good  Language:  Good  Akathisia:  Negative  Handed:  Right  AIMS (if indicated):     Assets:  Communication Skills Desire for Improvement Resilience  ADL's:  Intact  Cognition:  WNL  Sleep:  Number of Hours: 5.75   Treatment Plan Summary: Daily contact with patient to assess and evaluate symptoms and progress in treatment, Medication management, Plan inpatient treatment and medications as below Encourage ongoing group , milieu participation to work on coping skills and symptom reduction Encourage ongoing efforts /focus on recovery, relapse prevention  Continue the Atripla for management of HIV infection. Continue the Paxil 30 mgrs QDAY for depression, anxiety Continue the Ibuprofen PRNs for pain, as needed. Continue Protonix to minimize GERD, reflux symptoms. Continue Ambien 5 mgrs QHS PRN for insomnia. Continue the Crestor 20 mg for high cholesterol,  Lisinopril 10 mg HTN, Norvacs 10 mg for HTN, ASA 81 mg for heart health, Coreg 25 mg for  HTN. Discused with CSW disposition. planning, pending.   Lindell Spar I, NP PMHNP-BC, FNP-BC 09/13/2015, 3:06 PM

## 2015-09-14 ENCOUNTER — Telehealth: Payer: Self-pay

## 2015-09-14 MED ORDER — IBUPROFEN 600 MG PO TABS
ORAL_TABLET | ORAL | Status: AC
  Administered 2015-09-14: 600 mg via ORAL
  Filled 2015-09-14: qty 1

## 2015-09-14 MED ORDER — IBUPROFEN 600 MG PO TABS
600.0000 mg | ORAL_TABLET | Freq: Four times a day (QID) | ORAL | Status: DC | PRN
  Administered 2015-09-14 – 2015-09-16 (×9): 600 mg via ORAL
  Filled 2015-09-14 (×8): qty 1

## 2015-09-14 NOTE — Progress Notes (Signed)
Patient ID: Nathan Santana, male   DOB: Jun 19, 1960, 55 y.o.   MRN: [id number] Patient ID: Nathan Santana, male   DOB: 14-Jun-1960, 55 y.o.   MRN: [id number] [facility] MD Progress Note  09/14/2015 5:11 PM Nathan Santana  MRN:  [id number]   Subjective: Nathan Santana reports, "Today, I'm starting to feel better. I believe my medicines are trying to kick in now. I just have the suicide thoughts off & on, but it is getting better"  Objective: Nathan Santana is seen & chart is reviewed. He is reporting that he is starting to feel better with the symptoms of his depression. He adds that he still has a lot going on emotionally for him due to his situation on the outside after he gets discharged. He says his truck was pounded by the state & sold in auction recently. He says he is not sleeping well at night He was cooperative with assessment and appears to be interacting well on the unit. He denies any adverse reactions to medications and is compliant with his regimen. He currently continue to present with vague suicidal ideations. Says he has missed 2 separate court dates. Worried about how to deal with it after discharge.  Principal Problem: Cocaine dependence with cocaine-induced mood disorder ([place])  Diagnosis:   Patient Active Problem List   Diagnosis Date Noted  . Cocaine dependence with cocaine-induced mood disorder ([place]) [F14.24] 09/07/2015  . Severe episode of recurrent major depressive disorder, without psychotic features ([place]) [F33.2]   . Hyperlipidemia LDL goal <130 [E78.5] 03/01/2015  . Orchalgia [N50.819]   . Testicular pain, right [N50.811]   . HIV (human immunodeficiency virus infection) ([place]) [Z21] 02/05/2015  . CAD (coronary artery disease) [I25.10] 02/05/2015  . Chest pain [R07.9] 02/05/2015  . Suicidal ideations [R45.851] 02/05/2015  . Hypertension [I10] 02/05/2015  . Anxiety [F41.9]   . Pain in the chest [R07.9]   . Depression [F32.9]   . Suicidal ideation [R45.851]    Total Time spent with  patient: 15 minutes  Past Medical History:  Past Medical History  Diagnosis Date  . HIV infection ([place])   . Cancer ([place])   . Hypertension   . Angina pectoris ([place])   . Scrotal cyst   . Anxiety   . Depression   . Hypercholesteremia   . Myocardial infarct, old     Past Surgical History  Procedure Laterality Date  . Scrotal turmor removed     Family History:  Family History  Problem Relation Age of Onset  . Hypertension Other   . CAD Sister     Social History:  History  Alcohol Use No    Comment: one beer weekly     History  Drug Use  . Yes  . Special: Marijuana, Cocaine    Comment: Cocaine    Social History   Social History  . Marital Status: Single    Spouse Name: N/A  . Number of Children: 4  . Years of Education: N/A   Social History Main Topics  . Smoking status: Current Some Day Smoker -- 0.50 packs/day    Types: Cigars, Cigarettes  . Smokeless tobacco: None  . Alcohol Use: No     Comment: one beer weekly  . Drug Use: Yes    Special: Marijuana, Cocaine     Comment: Cocaine  . Sexual Activity: No   Other Topics Concern  . None   Social History Narrative   Additional Social History:   Sleep: Poor-(encouraged to ask for his Ambien prior to  going to bed at night).  Appetite:  improved   Current Medications: Current Facility-Administered Medications  Medication Dose Route Frequency Provider Last Rate Last Dose  . acetaminophen (TYLENOL) tablet 1,000 mg  1,000 mg Oral Q6H PRN Patrecia Pour, NP   1,000 mg at 09/14/15 1516  . alum & mag hydroxide-simeth (MAALOX/MYLANTA) 200-200-20 MG/5ML suspension 30 mL  30 mL Oral Q4H PRN Patrecia Pour, NP      . amLODipine (NORVASC) tablet 10 mg  10 mg Oral Daily Patrecia Pour, NP   10 mg at 09/14/15 [id number]  . aspirin EC tablet 81 mg  81 mg Oral Daily Patrecia Pour, NP   81 mg at 09/14/15 [id number]  . benzocaine (ORAJEL) 10 % mucosal gel   Mouth/Throat QID PRN Kerrie Buffalo, NP      . carvedilol (COREG) tablet 25  mg  25 mg Oral BID WC Patrecia Pour, NP   25 mg at 09/14/15 [id number]  . efavirenz-emtricitabine-tenofovir (ATRIPLA) 600-200-300 MG per tablet 1 tablet  1 tablet Oral QHS Patrecia Pour, NP   1 tablet at 09/13/15 2037  . ibuprofen (ADVIL,MOTRIN) tablet 600 mg  600 mg Oral Q6H PRN Kerrie Buffalo, NP   600 mg at 09/14/15 1516  . lisinopril (PRINIVIL,ZESTRIL) tablet 10 mg  10 mg Oral Daily Patrecia Pour, NP   10 mg at 09/14/15 [id number]  . magnesium hydroxide (MILK OF MAGNESIA) suspension 30 mL  30 mL Oral Daily PRN Patrecia Pour, NP      . nitroGLYCERIN (NITROSTAT) SL tablet 0.4 mg  0.4 mg Sublingual Q5 min PRN Patrecia Pour, NP   0.4 mg at 09/12/15 0836  . pantoprazole (PROTONIX) EC tablet 40 mg  40 mg Oral Daily Patrecia Pour, NP   40 mg at 09/14/15 [id number]  . PARoxetine (PAXIL) tablet 30 mg  30 mg Oral Daily Kerrie Buffalo, NP   30 mg at 09/14/15 [id number]  . rosuvastatin (CRESTOR) tablet 20 mg  20 mg Oral Daily Patrecia Pour, NP   20 mg at 09/14/15 [id number]  . zolpidem (AMBIEN) tablet 5 mg  5 mg Oral QHS PRN Patrecia Pour, NP   5 mg at 09/13/15 2220    Lab Results: No results found for this or any previous visit (from the past 70 hour(s)).  Blood Alcohol level:  Lab Results  Component Value Date   ETH <5 09/06/2015   ETH <5 08/16/2015   Physical Findings: AIMS: Facial and Oral Movements Muscles of Facial Expression: None, normal Lips and Perioral Area: None, normal Jaw: None, normal Tongue: None, normal,Extremity Movements Upper (arms, wrists, hands, fingers): None, normal Lower (legs, knees, ankles, toes): None, normal, Trunk Movements Neck, shoulders, hips: None, normal, Overall Severity Severity of abnormal movements (highest score from questions above): None, normal Incapacitation due to abnormal movements: None, normal Patient's awareness of abnormal movements (rate only patient's report): No Awareness, Dental Status Current problems with teeth and/or dentures?: Yes Does patient usually wear  dentures?: No  CIWA:    COWS:     Musculoskeletal: Strength & Muscle Tone: within normal limits Gait & Station: normal Patient leans: N/A  Psychiatric Specialty Exam: Physical Exam  Nursing note and vitals reviewed. Psychiatric: His mood appears anxious. He exhibits a depressed mood.    Review of Systems  Psychiatric/Behavioral: Positive for depression. The patient is nervous/anxious.    No chest pain, no shortness of breath at room air, no nausea, no vomiting, describes some ankle  pain today  Blood pressure 141/95, pulse 64, temperature 98.4 F (36.9 C), temperature source Oral, resp. rate 20, height 5\' 9"  (1.753 m), weight 82.555 kg (182 lb).Body mass index is 26.86 kg/(m^2).  General Appearance: Fairly Groomed  Eye Contact:  Good  Speech:  Normal Rate  Volume:  Normal  Mood:  Depressed and Hopeless  Affect:  Appropriate and reactive, smiles at times  Thought Process:  Linear  Orientation:  Full (Time, Place, and Person)  Thought Content:  Denies any hallucinations, delusions or paranoia.  Suicidal Thoughts:  "Just fleeting thoughts"  Homicidal Thoughts:  No   Memory:  recent and remote grossly intact   Judgement:  Fair  Insight:  Fair  Psychomotor Activity:  Normal  Concentration:  Concentration: Good and Attention Span: Good  Recall:  Good  Fund of Knowledge:  Good  Language:  Good  Akathisia:  Negative  Handed:  Right  AIMS (if indicated):     Assets:  Communication Skills Desire for Improvement Resilience  ADL's:  Intact  Cognition:  WNL  Sleep:  Number of Hours: 5.75   Treatment Plan Summary: Daily contact with patient to assess and evaluate symptoms and progress in treatment, Medication management, Plan inpatient treatment and medications as below Encourage ongoing group , milieu participation to work on coping skills and symptom reduction Encourage ongoing efforts /focus on recovery, relapse prevention  Continue the Atripla for management of HIV  infection. Continue the Paxil 30 mgrs QDAY for depression, anxiety Continue the Ibuprofen PRNs for pain, as needed. Continue Protonix to minimize GERD, reflux symptoms. Continue Ambien 5 mgrs QHS PRN for insomnia. Continue the Crestor 20 mg for high cholesterol, Lisinopril 10 mg HTN, Norvacs 10 mg for HTN, ASA 81 mg for heart health, Coreg 25 mg for  HTN. Discused with CSW disposition. planning, pending.   Lindell Spar I, NP PMHNP-BC, FNP-BC 09/14/2015, 5:11 PM

## 2015-09-14 NOTE — Telephone Encounter (Signed)
Call placed to [facility] to inform staff this patient is in need of infectious disease appointment.  We have been trying to contact patient since April, 2017. Spoke with Case Manager to ensure this is scheduled prior to discharge.  Kristen  Patient can be scheduled for labs and new patient office visit.   Tammy

## 2015-09-14 NOTE — B[facility] Group Notes (Signed)
Adult Psychoeducational Group Note  Date:  09/14/2015 Time:  9:43 PM  Group Topic/Focus:  Wrap-Up Group:   The focus of this group is to help patients review their daily goal of treatment and discuss progress on daily workbooks.  Participation Level:  Minimal  Participation Quality:  Appropriate  Affect:  Appropriate  Cognitive:  Appropriate  Insight: Good  Engagement in Group:  Limited  Modes of Intervention:  Discussion  Additional Comments:  Pt stated his day was all right "for what it's worth".  Pt expressed that he had good communication with the people in his past.  His goal was to "do me because I forgot about myself".  Pt also stated he wants to be responsible for himself.  Pt expressed that he wants to go to an amusement park or something fun.  Nathan Santana A 09/14/2015, 9:43 PM

## 2015-09-14 NOTE — Progress Notes (Signed)
Nathan Santana had been up and visible in milieu this evening, did attend and participate in evening group activity. Nathan Santana spoke about his depression and trying to find ways in which to manage it better and trying to find ways to not let things bother him that he can not control. Nathan Santana also complained of on-going tooth pain and requested and received medication to help. Nathan Santana also requested and received medication to help with sleep as he spoke about his continuing difficulty with sleep. A. Support provided. R. Safety maintained, will continue to monitor.

## 2015-09-14 NOTE — Progress Notes (Signed)
Recreation Therapy Notes  Animal-Assisted Activity (AAA) Program Checklist/Progress Notes Patient Eligibility Criteria Checklist & Daily Group note for Rec Tx Intervention  Date: 06.27.2017 Time: 2:45pm Location: [address]    AAA/T Program Assumption of Risk Form signed by Patient/ or Parent Legal Guardian Yes  Patient is free of allergies or sever asthma Yes  Patient reports no fear of animals Yes  Patient reports no history of cruelty to animals Yes  Patient understands his/her participation is voluntary Yes  Patient washes hands before animal contact Yes  Patient washes hands after animal contact Yes  Behavioral Response: Did not attend.   Laureen Ochs Norvel Wenker, LRT/CTRS        Neoma Uhrich L 09/14/2015 3:04 PM

## 2015-09-14 NOTE — Progress Notes (Signed)
D: Patient's self inventory sheet: patient has fair sleep, recieved sleep medication.fair  Appetite, low energy level, unsure about  concentration. Rated depression 7/10, hopeless 7/10, anxiety 7/10. SI/HI/AVH: Denies all. Physical complaints are tooth pain. Goal is "accepting reality". Plans to work on "weight out the most important". Pt expressed concern that he has no income and no place to go at discharge.  A: Medications administered, assessed medication knowledge and education given on medication regimen.  Emotional support and encouragement given patient. R: Denies SI and HI , contracts for safety. Safety maintained with 15 minute checks.

## 2015-09-14 NOTE — BHH Group Notes (Signed)
[facility] LCSW Group Therapy  09/14/2015   1:15 PM   Type of Therapy:  Group Therapy  Participation Level:  Active  Participation Quality:  Attentive, Sharing and Supportive  Affect:  Appropriate  Cognitive:  Alert and Oriented  Insight:  Developing/Improving and Engaged  Engagement in Therapy:  Developing/Improving and Engaged  Modes of Intervention:  Clarification, Confrontation, Discussion, Education, Exploration, Limit-setting, Orientation, Problem-solving, Rapport Building, Art therapist, Socialization and Support  Summary of Progress/Problems: The topic for group therapy was feelings about diagnosis.  Pt actively participated in group discussion on their past and current diagnosis and how they feel towards this.  Pt also identified how society and family members judge them, based on their diagnosis as well as stereotypes and stigmas.  Patient discussed his perception of his mental illness and expressed a low level of support from family. CSW and other group members provided patient with emotional support and encouragement.   Nathan Santana, MSW, [place] Clinical Social Worker [facility] [phone]

## 2015-09-14 NOTE — Telephone Encounter (Signed)
Patient will not need labs . Just new patient office visit.   Tammy

## 2015-09-15 MED ORDER — NICOTINE POLACRILEX 2 MG MT GUM
2.0000 mg | CHEWING_GUM | OROMUCOSAL | Status: DC | PRN
  Administered 2015-09-15: 2 mg via ORAL
  Filled 2015-09-15: qty 1

## 2015-09-15 NOTE — BHH Group Notes (Signed)
[facility] LCSW Aftercare Discharge Planning Group Note  09/15/2015 8:45 AM  Participation Quality: Alert, Appropriate and Oriented  Mood/Affect: Appropriate; Improved  Depression Rating: 5  Anxiety Rating: 5  Thoughts of Suicide: Pt denies SI/HI  Will you contract for safety? Yes  Current AVH: Pt denies  Plan for Discharge/Comments: Pt attended discharge planning group and actively participated in group. CSW discussed suicide prevention education with the group and encouraged them to discuss discharge planning and any relevant barriers. Pt reports that his mood is improving. Per Pt, he is working on a place to stay.   Transportation Means: Pt reports access to transportation  Supports: No supports mentioned at this time  [name], [place] 09/15/2015 9:50 AM

## 2015-09-15 NOTE — Progress Notes (Addendum)
Patient ID: Nathan Santana, male   DOB: May 07, 1960, 55 y.o.   MRN: [id number]  Pt currently presents with a flat affect and cooperative behavior. Per self inventory, pt rates depression, hopelessness and anxiety at a 6. Pt's daily goal is to "where I'm going" and they intend to do so by "start making arrangements." Pt reports fair sleep, a good appetite, low energy and good concentration. Pt states "I'll be able to leave soon when I can cash my checks, I am thinking I am going to live with sister, forget the truck and the girl."  Pt provided with medications per providers orders. Pt's labs and vitals were monitored throughout the day. Pt supported emotionally and encouraged to express concerns and questions. Pt educated on medications.  Pt's safety ensured with 15 minute and environmental checks. Pt currently denies SI/HI and A/V hallucinations. Pt verbally agrees to seek staff if SI/HI or A/VH occurs and to consult with staff before acting on any harmful thoughts. Pt writes "thank you for your time." Will continue POC.

## 2015-09-15 NOTE — Progress Notes (Signed)
Recreation Therapy Notes  Date: 06.28.2017 Time: 9:30am Location: 300 Hall Group Room   Group Topic: Stress Management  Goal Area(s) Addresses:  Patient will actively participate in stress management techniques presented during session.   Behavioral Response: Did not attend.   Laureen Ochs Tirsa Gail, LRT/CTRS        Shaelee Forni L 09/15/2015 10:24 AM

## 2015-09-15 NOTE — Progress Notes (Signed)
[facility] MD Progress Note  09/15/2015 3:27 PM Nathan Santana  MRN:  [id number]   Subjective: Nathan Santana states that he has no c/o.  He is worried about lack if transportation and being homeless.  He is feeling depressed about that.   Objective: Nathan Santana is seen & chart is reviewed. He is reporting that he is starting to feel better with the symptoms of his depression.  He denies any adverse reactions to medications and is compliant with his regimen. He currently continues to present with vague suicidal ideations.  Worried about how to deal with it after discharge as stated above.  Principal Problem: Cocaine dependence with cocaine-induced mood disorder ([place])  Diagnosis:   Patient Active Problem List   Diagnosis Date Noted  . Cocaine dependence with cocaine-induced mood disorder [facility]) [F14.24] 09/07/2015    Priority: High  . Severe episode of recurrent major depressive disorder, without psychotic features ([place]) [F33.2]   . Hyperlipidemia LDL goal <130 [E78.5] 03/01/2015  . Orchalgia [N50.819]   . Testicular pain, right [N50.811]   . HIV (human immunodeficiency virus infection) (Nathan Santana) [Z21] 02/05/2015  . CAD (coronary artery disease) [I25.10] 02/05/2015  . Chest pain [R07.9] 02/05/2015  . Suicidal ideations [R45.851] 02/05/2015  . Hypertension [I10] 02/05/2015  . Anxiety [F41.9]   . Pain in the chest [R07.9]   . Depression [F32.9]   . Suicidal ideation [R45.851]    Total Time spent with patient: 15 minutes  Past Medical History:  Past Medical History  Diagnosis Date  . HIV infection ([place])   . Cancer ([place])   . Hypertension   . Angina pectoris ([place])   . Scrotal cyst   . Anxiety   . Depression   . Hypercholesteremia   . Myocardial infarct, old     Past Surgical History  Procedure Laterality Date  . Scrotal turmor removed     Family History:  Family History  Problem Relation Age of Onset  . Hypertension Other   . CAD Sister     Social History:  History  Alcohol Use No   Comment: one beer weekly     History  Drug Use  . Yes  . Special: Marijuana, Cocaine    Comment: Cocaine    Social History   Social History  . Marital Status: Single    Spouse Name: N/A  . Number of Children: 4  . Years of Education: N/A   Social History Main Topics  . Smoking status: Current Some Day Smoker -- 0.50 packs/day    Types: Cigars, Cigarettes  . Smokeless tobacco: None  . Alcohol Use: No     Comment: one beer weekly  . Drug Use: Yes    Special: Marijuana, Cocaine     Comment: Cocaine  . Sexual Activity: No   Other Topics Concern  . None   Social History Narrative   Additional Social History:   Sleep: Poor-(encouraged to ask for his Ambien prior to going to bed at night).  Appetite:  improved   Current Medications: Current Facility-Administered Medications  Medication Dose Route Frequency Provider Last Rate Last Dose  . acetaminophen (TYLENOL) tablet 1,000 mg  1,000 mg Oral Q6H PRN Patrecia Pour, NP   1,000 mg at 09/15/15 0840  . alum & mag hydroxide-simeth (MAALOX/MYLANTA) 200-200-20 MG/5ML suspension 30 mL  30 mL Oral Q4H PRN Patrecia Pour, NP      . amLODipine (NORVASC) tablet 10 mg  10 mg Oral Daily Patrecia Pour, NP   10 mg at  09/15/15 0836  . aspirin EC tablet 81 mg  81 mg Oral Daily Patrecia Pour, NP   81 mg at 09/15/15 0836  . benzocaine (ORAJEL) 10 % mucosal gel   Mouth/Throat QID PRN Kerrie Buffalo, NP      . carvedilol (COREG) tablet 25 mg  25 mg Oral BID WC Patrecia Pour, NP   25 mg at 09/15/15 0836  . efavirenz-emtricitabine-tenofovir (ATRIPLA) 600-200-300 MG per tablet 1 tablet  1 tablet Oral QHS Patrecia Pour, NP   1 tablet at 09/15/15 0142  . ibuprofen (ADVIL,MOTRIN) tablet 600 mg  600 mg Oral Q6H PRN Kerrie Buffalo, NP   600 mg at 09/15/15 0841  . lisinopril (PRINIVIL,ZESTRIL) tablet 10 mg  10 mg Oral Daily Patrecia Pour, NP   10 mg at 09/15/15 0836  . magnesium hydroxide (MILK OF MAGNESIA) suspension 30 mL  30 mL Oral Daily PRN  Patrecia Pour, NP      . nicotine polacrilex (NICORETTE) gum 2 mg  2 mg Oral PRN Ursula Alert, MD   2 mg at 09/15/15 1007  . nitroGLYCERIN (NITROSTAT) SL tablet 0.4 mg  0.4 mg Sublingual Q5 min PRN Patrecia Pour, NP   0.4 mg at 09/12/15 0836  . pantoprazole (PROTONIX) EC tablet 40 mg  40 mg Oral Daily Patrecia Pour, NP   40 mg at 09/15/15 0836  . PARoxetine (PAXIL) tablet 30 mg  30 mg Oral Daily Kerrie Buffalo, NP   30 mg at 09/15/15 0836  . rosuvastatin (CRESTOR) tablet 20 mg  20 mg Oral Daily Patrecia Pour, NP   20 mg at 09/15/15 0836  . zolpidem (AMBIEN) tablet 5 mg  5 mg Oral QHS PRN Patrecia Pour, NP   5 mg at 09/15/15 0143    Lab Results: No results found for this or any previous visit (from the past 34 hour(s)).  Blood Alcohol level:  Lab Results  Component Value Date   ETH <5 09/06/2015   ETH <5 08/16/2015   Physical Findings: AIMS: Facial and Oral Movements Muscles of Facial Expression: None, normal Lips and Perioral Area: None, normal Jaw: None, normal Tongue: None, normal,Extremity Movements Upper (arms, wrists, hands, fingers): None, normal Lower (legs, knees, ankles, toes): None, normal, Trunk Movements Neck, shoulders, hips: None, normal, Overall Severity Severity of abnormal movements (highest score from questions above): None, normal Incapacitation due to abnormal movements: None, normal Patient's awareness of abnormal movements (rate only patient's report): No Awareness, Dental Status Current problems with teeth and/or dentures?: Yes Does patient usually wear dentures?: No  CIWA:    COWS:     Musculoskeletal: Strength & Muscle Tone: within normal limits Gait & Station: normal Patient leans: N/A  Psychiatric Specialty Exam: Physical Exam  Nursing note and vitals reviewed. Psychiatric: His mood appears anxious. He exhibits a depressed mood.    Review of Systems  Psychiatric/Behavioral: Positive for depression. The patient is nervous/anxious.    No  chest pain, no shortness of breath at room air, no nausea, no vomiting, describes some ankle pain today  Blood pressure 143/94, pulse 66, temperature 98.1 F (36.7 C), temperature source Oral, resp. rate 18, height 5\' 9"  (1.753 m), weight 82.555 kg (182 lb).Body mass index is 26.86 kg/(m^2).  General Appearance: Fairly Groomed  Eye Contact:  Good  Speech:  Normal Rate  Volume:  Normal  Mood:  Depressed and Hopeless  Affect:  Appropriate and reactive, smiles at times  Thought Process:  Linear  Orientation:  Full (Time, Place, and Person)  Thought Content:  Denies any hallucinations, delusions or paranoia.  Suicidal Thoughts:  No  Homicidal Thoughts:  No   Memory:  recent and remote grossly intact   Judgement:  Fair  Insight:  Fair  Psychomotor Activity:  Normal  Concentration:  Concentration: Good and Attention Span: Good  Recall:  Good  Fund of Knowledge:  Good  Language:  Good  Akathisia:  Negative  Handed:  Right  AIMS (if indicated):     Assets:  Communication Skills Desire for Improvement Resilience  ADL's:  Intact  Cognition:  WNL  Sleep:  Number of Hours: 6.75   Treatment Plan Summary: Daily contact with patient to assess and evaluate symptoms and progress in treatment, Medication management, Plan inpatient treatment and medications as below Encourage ongoing group , milieu participation to work on coping skills and symptom reduction Encourage ongoing efforts /focus on recovery, relapse prevention  Continue the Atripla for management of HIV infection. Continue the Paxil 30 mgrs QDAY for depression, anxiety Continue the Ibuprofen PRNs for pain, as needed. Continue Protonix to minimize GERD, reflux symptoms. Continue Ambien 5 mgrs QHS PRN for insomnia. Continue the Crestor 20 mg for high cholesterol, Lisinopril 10 mg HTN, Norvacs 10 mg for HTN, ASA 81 mg for heart health, Coreg 25 mg for  HTN. Discused with CSW disposition. planning, pending.   [place] 09/15/2015, 3:27 PM

## 2015-09-15 NOTE — Progress Notes (Signed)
D: Pt denies SI/HI/AVH. Pt is pleasant and cooperative. Pt stated he was doing ok tonight.   A: Pt was offered support and encouragement. Pt was given scheduled medications. Pt was encourage to attend groups. Q 15 minute checks were done for safety.   R:Pt attends groups and interacts well with peers and staff. Pt is taking medication. Pt has no complaints.Pt receptive to treatment and safety maintained on unit.

## 2015-09-15 NOTE — BHH Group Notes (Signed)
[facility] LCSW Group Therapy 09/15/2015 1:15 PM  Type of Therapy: Group Therapy- Emotion Regulation  Pt did not attend, declined invitation.   [name], LCSWA 09/15/2015 4:16 PM

## 2015-09-16 NOTE — BHH Group Notes (Signed)

## 2015-09-16 NOTE — Progress Notes (Signed)
D   Pt is pleasant and cooperative   He was somewhat confrontive to another patient after group because that person used a word he didn't like   He was redirected and was able to maintain calm   He reports feeling better since admission and said he planned to get his finances back together and make life simpler  A    Verbal support given   Medications administered and effectiveness monitored   Redirection as needed   Q 15 min checks R    Pt is safe and receptive to verbal support

## 2015-09-16 NOTE — Progress Notes (Signed)
[facility] MD Progress Note  09/16/2015 2:10 PM Nathan Santana  MRN:  [id number]   Subjective:  Nathan Santana states that he has no c/o.  He states that he is waiting on his sister to fax documents to get his truck back.    Objective:  Nathan Santana is seen & chart is reviewed.  He is complaint with meds.  No disruptions unit.  He feels that overstaying makes him more irritable.   Principal Problem: Cocaine dependence with cocaine-induced mood disorder ([place])  Diagnosis:   Patient Active Problem List   Diagnosis Date Noted  . Cocaine dependence with cocaine-induced mood disorder [facility]) [F14.24] 09/07/2015    Priority: High  . Severe episode of recurrent major depressive disorder, without psychotic features ([place]) [F33.2]   . Hyperlipidemia LDL goal <130 [E78.5] 03/01/2015  . Orchalgia [N50.819]   . Testicular pain, right [N50.811]   . HIV (human immunodeficiency virus infection) ([place]) [Z21] 02/05/2015  . CAD (coronary artery disease) [I25.10] 02/05/2015  . Chest pain [R07.9] 02/05/2015  . Suicidal ideations [R45.851] 02/05/2015  . Hypertension [I10] 02/05/2015  . Anxiety [F41.9]   . Pain in the chest [R07.9]   . Depression [F32.9]   . Suicidal ideation [R45.851]    Total Time spent with patient: 15 minutes  Past Medical History:  Past Medical History  Diagnosis Date  . HIV infection ([place])   . Cancer ([place])   . Hypertension   . Angina pectoris ([place])   . Scrotal cyst   . Anxiety   . Depression   . Hypercholesteremia   . Myocardial infarct, old     Past Surgical History  Procedure Laterality Date  . Scrotal turmor removed     Family History:  Family History  Problem Relation Age of Onset  . Hypertension Other   . CAD Sister     Social History:  History  Alcohol Use No    Comment: one beer weekly     History  Drug Use  . Yes  . Special: Marijuana, Cocaine    Comment: Cocaine    Social History   Social History  . Marital Status: Single    Spouse Name: N/A  . Number of  Children: 4  . Years of Education: N/A   Social History Main Topics  . Smoking status: Current Some Day Smoker -- 0.50 packs/day    Types: Cigars, Cigarettes  . Smokeless tobacco: None  . Alcohol Use: No     Comment: one beer weekly  . Drug Use: Yes    Special: Marijuana, Cocaine     Comment: Cocaine  . Sexual Activity: No   Other Topics Concern  . None   Social History Narrative   Additional Social History:   Sleep: Poor-(encouraged to ask for his Ambien prior to going to bed at night).  Appetite:  improved   Current Medications: Current Facility-Administered Medications  Medication Dose Route Frequency Provider Last Rate Last Dose  . acetaminophen (TYLENOL) tablet 1,000 mg  1,000 mg Oral Q6H PRN Patrecia Pour, NP   1,000 mg at 09/15/15 2213  . alum & mag hydroxide-simeth (MAALOX/MYLANTA) 200-200-20 MG/5ML suspension 30 mL  30 mL Oral Q4H PRN Patrecia Pour, NP      . amLODipine (NORVASC) tablet 10 mg  10 mg Oral Daily Patrecia Pour, NP   10 mg at 09/16/15 0858  . aspirin EC tablet 81 mg  81 mg Oral Daily Patrecia Pour, NP   81 mg at 09/16/15 0858  .  benzocaine (ORAJEL) 10 % mucosal gel   Mouth/Throat QID PRN Kerrie Buffalo, NP      . carvedilol (COREG) tablet 25 mg  25 mg Oral BID WC Patrecia Pour, NP   25 mg at 09/16/15 0858  . efavirenz-emtricitabine-tenofovir (ATRIPLA) 600-200-300 MG per tablet 1 tablet  1 tablet Oral QHS Patrecia Pour, NP   1 tablet at 09/15/15 2213  . ibuprofen (ADVIL,MOTRIN) tablet 600 mg  600 mg Oral Q6H PRN Kerrie Buffalo, NP   600 mg at 09/16/15 0903  . lisinopril (PRINIVIL,ZESTRIL) tablet 10 mg  10 mg Oral Daily Patrecia Pour, NP   10 mg at 09/16/15 0858  . magnesium hydroxide (MILK OF MAGNESIA) suspension 30 mL  30 mL Oral Daily PRN Patrecia Pour, NP      . nicotine polacrilex (NICORETTE) gum 2 mg  2 mg Oral PRN Ursula Alert, MD   2 mg at 09/15/15 1007  . nitroGLYCERIN (NITROSTAT) SL tablet 0.4 mg  0.4 mg Sublingual Q5 min PRN Patrecia Pour, NP   0.4 mg at 09/12/15 0836  . pantoprazole (PROTONIX) EC tablet 40 mg  40 mg Oral Daily Patrecia Pour, NP   40 mg at 09/16/15 0858  . PARoxetine (PAXIL) tablet 30 mg  30 mg Oral Daily Kerrie Buffalo, NP   30 mg at 09/16/15 0858  . rosuvastatin (CRESTOR) tablet 20 mg  20 mg Oral Daily Patrecia Pour, NP   20 mg at 09/16/15 0858  . zolpidem (AMBIEN) tablet 5 mg  5 mg Oral QHS PRN Patrecia Pour, NP   5 mg at 09/15/15 2213    Lab Results: No results found for this or any previous visit (from the past 44 hour(s)).  Blood Alcohol level:  Lab Results  Component Value Date   ETH <5 09/06/2015   ETH <5 08/16/2015   Physical Findings: AIMS: Facial and Oral Movements Muscles of Facial Expression: None, normal Lips and Perioral Area: None, normal Jaw: None, normal Tongue: None, normal,Extremity Movements Upper (arms, wrists, hands, fingers): None, normal Lower (legs, knees, ankles, toes): None, normal, Trunk Movements Neck, shoulders, hips: None, normal, Overall Severity Severity of abnormal movements (highest score from questions above): None, normal Incapacitation due to abnormal movements: None, normal Patient's awareness of abnormal movements (rate only patient's report): No Awareness, Dental Status Current problems with teeth and/or dentures?: Yes Does patient usually wear dentures?: No  CIWA:    COWS:     Musculoskeletal: Strength & Muscle Tone: within normal limits Gait & Station: normal Patient leans: N/A  Psychiatric Specialty Exam: Physical Exam  Nursing note and vitals reviewed. Psychiatric: His mood appears anxious. He exhibits a depressed mood.    Review of Systems  Psychiatric/Behavioral: Positive for depression. The patient is nervous/anxious.    No chest pain, no shortness of breath at room air, no nausea, no vomiting, describes some ankle pain today  Blood pressure 147/89, pulse 63, temperature 98.1 F (36.7 C), temperature source Oral, resp. rate 18,  height 5\' 9"  (1.753 m), weight 82.555 kg (182 lb).Body mass index is 26.86 kg/(m^2).  General Appearance: Fairly Groomed  Eye Contact:  Good  Speech:  Normal Rate  Volume:  Normal  Mood:  Depressed and Hopeless  Affect:  Appropriate and reactive, smiles at times  Thought Process:  Linear  Orientation:  Full (Time, Place, and Person)  Thought Content:  Denies any hallucinations, delusions or paranoia.  Suicidal Thoughts:  No  Homicidal Thoughts:  No  Memory:  recent and remote grossly intact   Judgement:  Fair  Insight:  Fair  Psychomotor Activity:  Normal  Concentration:  Concentration: Good and Attention Span: Good  Recall:  Good  Fund of Knowledge:  Good  Language:  Good  Akathisia:  Negative  Handed:  Right  AIMS (if indicated):     Assets:  Communication Skills Desire for Improvement Resilience  ADL's:  Intact  Cognition:  WNL  Sleep:  Number of Hours: 6   Treatment Plan Summary: Daily contact with patient to assess and evaluate symptoms and progress in treatment, Medication management, Plan inpatient treatment and medications as below Encourage ongoing group , milieu participation to work on coping skills and symptom reduction Encourage ongoing efforts /focus on recovery, relapse prevention  Continue the Atripla for management of HIV infection. Continue the Paxil 30 mgrs QDAY for depression, anxiety Continue the Ibuprofen PRNs for pain, as needed. Continue Protonix to minimize GERD, reflux symptoms. Continue Ambien 5 mgrs QHS PRN for insomnia. Continue the Crestor 20 mg for high cholesterol, Lisinopril 10 mg HTN, Norvacs 10 mg for HTN, ASA 81 mg for heart health, Coreg 25 mg for  HTN. Discused with CSW disposition. Planning for temp stay at shelter in [facility] until he manages to get his vehicle back.  Freda Munro May Nathan Odwyer, NP -[facility] 09/16/2015, 2:10 PM

## 2015-09-16 NOTE — Plan of Care (Signed)
Problem: Education: Goal: Utilization of techniques to improve thought processes will improve Outcome: Progressing Nurse discussed depression/coping skills with patient.    

## 2015-09-16 NOTE — Progress Notes (Signed)
D:  Patient's self inventory sheet, patient has fair sleep, sleep medication is helpful.  Fair appetite, low energy level, not sure of concentration.  Rated depression, anxiety and hopeless #6.  Denied withdrawals.  Denied SI.  Denied physical problems.  Physical pain, tooth ache, worst pain in past 24 hours is #7, does have pain medication.  Question discharge, goal is to reach out to places.  No discharge plans.   A:  Medications administered per MD orders.  Emotional support and encouragement given patient. R:  Denied SI and HI, contracts for safety.  Denied A/V hallucinations.  Safety maintained with 15 minute checks.

## 2015-09-16 NOTE — Care Management Utilization Note (Signed)
   Per State Regulation 482.30  This chart was reviewed for necessity with respect to the patient's Admission/ Duration of stay.  Next review date: 09/19/15  Skipper Cliche RN, BSN

## 2015-09-16 NOTE — BHH Group Notes (Signed)
[facility] LCSW Group Therapy 09/16/2015 1:15 PM Type of Therapy: Group Therapy Participation Level: Active  Participation Quality: Attentive, Sharing and Supportive  Affect: Appropriate  Cognitive: Alert and Oriented  Insight: Developing/Improving and Engaged  Engagement in Therapy: Developing/Improving and Engaged  Modes of Intervention: Activity, Clarification, Confrontation, Discussion, Education, Exploration, Limit-setting, Orientation, Problem-solving, Rapport Building, Art therapist, Socialization and Support  Summary of Progress/Problems: Patient was attentive and engaged with speaker from [facility]. Patient was attentive to speaker while they shared their story of dealing with mental health and overcoming it. Patient expressed interest in their programs and services and received information on their agency. Patient processed ways they can relate to the speaker.   Tilden Fossa, LCSW Clinical Social Worker [facility] [phone]

## 2015-09-17 MED ORDER — NITROGLYCERIN 0.4 MG SL SUBL: 0.4000 mg | SUBLINGUAL_TABLET | SUBLINGUAL | Status: DC | PRN

## 2015-09-17 MED ORDER — PAROXETINE HCL 30 MG PO TABS: 30.0000 mg | ORAL_TABLET | Freq: Every day | ORAL | Status: DC

## 2015-09-17 MED ORDER — EFAVIRENZ-EMTRICITAB-TENOFOVIR 600-200-300 MG PO TABS: 1.0000 | ORAL_TABLET | Freq: Every day | ORAL | Status: DC

## 2015-09-17 MED ORDER — ROSUVASTATIN CALCIUM 20 MG PO TABS: 20.0000 mg | ORAL_TABLET | Freq: Every day | ORAL | Status: DC

## 2015-09-17 MED ORDER — LISINOPRIL 10 MG PO TABS: 10.0000 mg | ORAL_TABLET | Freq: Every day | ORAL | Status: DC

## 2015-09-17 MED ORDER — ZOLPIDEM TARTRATE 5 MG PO TABS: 5.0000 mg | ORAL_TABLET | Freq: Every evening | ORAL | Status: DC | PRN

## 2015-09-17 MED ORDER — AMLODIPINE BESYLATE 10 MG PO TABS: 10.0000 mg | ORAL_TABLET | Freq: Every day | ORAL | Status: DC

## 2015-09-17 MED ORDER — CARVEDILOL 25 MG PO TABS: 25.0000 mg | ORAL_TABLET | Freq: Two times a day (BID) | ORAL | Status: DC

## 2015-09-17 MED ORDER — NICOTINE POLACRILEX 2 MG MT GUM: 2.0000 mg | CHEWING_GUM | OROMUCOSAL | Status: DC | PRN

## 2015-09-17 MED ORDER — PANTOPRAZOLE SODIUM 40 MG PO TBEC: 40.0000 mg | DELAYED_RELEASE_TABLET | Freq: Every day | ORAL | Status: DC

## 2015-09-17 MED ORDER — ASPIRIN EC 81 MG PO TBEC: 81.0000 mg | DELAYED_RELEASE_TABLET | Freq: Every day | ORAL | Status: AC

## 2015-09-17 NOTE — Progress Notes (Addendum)
D). Patient denies SI/HI/AVH. Calm and cooperative.  Patient interacting well with staff and other patients. Attending groups. On self inventory patient reports: 7/10 for all depression, anxiety and hopelessness. Goal: "How to get through the weekend." A). Emotional support and encouragement offered. Education provided on medication, indications and side effect. Q 15 minute checks done for safety. R). Continue to maintain safety checks. Continue to take medications as prescribed. Continue to complete self inventory and attend groups.  Pt. discharged to lobby. Belongings retrieved from locker #36. Belongings sheet reviewed and signed by pt. Paperwork reviewed and pt. able to verbalize understanding of education. All belongings, including scripts, sent home with patient. Pt. in no current distress and ambulatory.

## 2015-09-17 NOTE — BHH Group Notes (Signed)
Patient attend group his day was a 5. He has been making arrangements to leave.

## 2015-09-17 NOTE — Progress Notes (Signed)
  [facility] Adult Case Management Discharge Plan :  Will you be returning to the same living situation after discharge:  No.Pt will be discharging to [facility] At discharge, do you have transportation home?: Yes,  Pt provided with bus pass and PART fare Do you have the ability to pay for your medications: Yes,  Pt provided with prescriptions  Release of information consent forms completed and in the chart;  Patient's signature needed at discharge.  Patient to Follow up at: Follow-up Information    Follow up with [facility] On 10/18/2015.   Why:  7/31 at 12:00pm for medication management with [name], NP.  7/13 at 1:00pm for therapy with Latoya    Contact information:   [id number] [place] [address] [phone]      Next level of care provider has access to [facility] and Suicide Prevention discussed: Yes,  with Pt; declines family contact  Have you used any form of tobacco in the last 30 days? (Cigarettes, Smokeless Tobacco, Cigars, and/or Pipes): Yes  Has patient been referred to the Quitline?: Patient refused referral  Patient has been referred for addiction treatment: Yes  [name] M 09/17/2015, 11:04 AM

## 2015-09-17 NOTE — Tx Team (Signed)
Interdisciplinary Treatment Plan Update (Adult) Date: 09/17/2015   Date: 09/17/2015 10:07 AM  Progress in Treatment:  Attending groups: Intermittently Participating in groups: Yes Taking medication as prescribed: Yes  Tolerating medication: Yes  Family/Significant othe contact made: No, Pt declines Patient understands diagnosis: Yes AEB seeking help with depression  Discussing patient identified problems/goals with staff: Yes  Medical problems stabilized or resolved: Yes  Denies suicidal/homicidal ideation: Yes Patient has not harmed self or Others: Yes   New problem(s) identified: None identified at this time.   Discharge Plan or Barriers: CSW will assess for appropriate discharge plan and relevant barriers.   09/13/15: Discharge plan still uncertain; Pt will follow-up with [facility]  09/17/15: Pt will discharge to [facility] and follow-up at [facility]  Additional comments:  Patient and CSW reviewed pt's identified goals and treatment plan. Patient verbalized understanding and agreed to treatment plan. CSW reviewed [facility] "Discharge Process and Patient Involvement" Form. Pt verbalized understanding of information provided and signed form.   Reason for Continuation of Hospitalization:  Depression Medication stabilization Suicidal ideation  Estimated length of stay: 0 days  Review of initial/current patient goals per problem list:   1.  Goal(s): Patient will participate in aftercare plan  Met:  Yes  Target date: 3-5 days from date of admission   As evidenced by: Patient will participate within aftercare plan AEB aftercare provider and housing plan at discharge being identified.  09/08/15: CSW to work with Pt to assess for appropriate discharge plan and faciliate appointments and referrals as needed prior to d/c. 09/13/15:  Discharge plan still uncertain; Pt will follow-up with [facility] 09/17/2015: Pt will discharge to  [facility] and follow-up at [facility]  2.  Goal (s): Patient will exhibit decreased depressive symptoms and suicidal ideations.  Met:  Adequate for DC  Target date: 3-5 days from date of admission   As evidenced by: Patient will utilize self rating of depression at 3 or below and demonstrate decreased signs of depression or be deemed stable for discharge by MD. 09/08/15: Pt was admitted with symptoms of depression, rating 10/10. Pt continues to present with flat affect and depressive symptoms.  Pt will demonstrate decreased symptoms of depression and rate depression at 3/10 or lower prior to discharge. 09/13/15: Pt rates depression at 8/10 09/17/15: MD feels that Pt's symptoms have decreased to the point that they can be managed in an outpatient setting.   Attendees:  Patient:    Family:    Physician: Dr. Shea Evans, MD  09/17/2015 10:07 AM  Nursing:  Leanne Lovely, RN; Janace Hoard, RN  09/17/2015 10:07 AM  Clinical Social Worker [name], [place] 09/17/2015 10:07 AM  Other: Erasmo Downer Drinkard, [place] 09/17/2015 10:07 AM  Clinical: 09/17/2015 10:07 AM  Other:  09/17/2015 10:07 AM  Other:     [name], [place] Social Work [phone]

## 2015-09-17 NOTE — Discharge Summary (Signed)
Physician Discharge Summary Note  Patient:  Nathan Santana is an 55 y.o., male MRN:  [id number] DOB:  [birth year] Patient phone:  [phone] (home)  Patient address:   [address] [place] [address],  Total Time spent with patient: 30 minutes  Date of Admission:  09/07/2015 Date of Discharge: 09/17/2015  Reason for Admission:  depression  Principal Problem: Cocaine dependence with cocaine-induced mood disorder [facility]) Discharge Diagnoses: Patient Active Problem List   Diagnosis Date Noted  . Cocaine dependence with cocaine-induced mood disorder [facility]) [F14.24] 09/07/2015    Priority: High  . Severe episode of recurrent major depressive disorder, without psychotic features ([place]) [F33.2]   . Hyperlipidemia LDL goal <130 [E78.5] 03/01/2015  . Orchalgia [N50.819]   . Testicular pain, right [N50.811]   . HIV (human immunodeficiency virus infection) ([place]) [Z21] 02/05/2015  . CAD (coronary artery disease) [I25.10] 02/05/2015  . Chest pain [R07.9] 02/05/2015  . Suicidal ideations [R45.851] 02/05/2015  . Hypertension [I10] 02/05/2015  . Anxiety [F41.9]   . Pain in the chest [R07.9]   . Depression [F32.9]   . Suicidal ideation [R45.851]     Past Psychiatric History: see HPI  Past Medical History:  Past Medical History  Diagnosis Date  . HIV infection ([place])   . Cancer ([place])   . Hypertension   . Angina pectoris ([place])   . Scrotal cyst   . Anxiety   . Depression   . Hypercholesteremia   . Myocardial infarct, old     Past Surgical History  Procedure Laterality Date  . Scrotal turmor removed     Family History:  Family History  Problem Relation Age of Onset  . Hypertension Other   . CAD Sister    Family Psychiatric  History: see HPI Social History:  History  Alcohol Use No    Comment: one beer weekly     History  Drug Use  . Yes  . Special: Marijuana, Cocaine    Comment: Cocaine    Social History   Social History  . Marital Status: Single    Spouse  Name: N/A  . Number of Children: 4  . Years of Education: N/A   Social History Main Topics  . Smoking status: Current Some Day Smoker -- 0.50 packs/day    Types: Cigars, Cigarettes  . Smokeless tobacco: None  . Alcohol Use: No     Comment: one beer weekly  . Drug Use: Yes    Special: Marijuana, Cocaine     Comment: Cocaine  . Sexual Activity: No   Other Topics Concern  . None   Social History Narrative    Hospital Course:    Tarrell Leadbeater was admitted for Cocaine dependence with cocaine-induced mood disorder ([place]) and crisis management.  He  was treated with medications with their indications listed below.  Medical problems were identified and treated as needed.  Home medications were restarted as appropriate.  Improvement was monitored by observation and Trevor Iha daily report of symptom reduction.  Emotional and mental status was monitored by daily self inventory reports completed by Trevor Iha and clinical staff.  Patient reported continued improvement, denied any new concerns.  Patient had been compliant on medications and denied side effects.  Support and encouragement was provided.    At time of discharge, patient rated both depression and anxiety levels to be manageable and minimal.  Patient encouraged to attend groups to help with recognizing triggers of emotional crises and de-stabilizations.  Patient encouraged to attend group  to help identify the positive things in life that would help in dealing with feelings of loss, depression and unhealthy or abusive tendencies.         Sebastyn Tiedt was evaluated by the treatment team for stability and plans for continued recovery upon discharge.  He was offered further treatment options upon discharge including Residential, Intensive Outpatient and Outpatient treatment. He will follow up with agencies listed below for medication management and counseling.  Encouraged patient to maintain satisfactory support network  and home environment.  Advised to adhere to medication compliance and outpatient treatment follow up.  Prescriptions provided.       Pendleton Ante motivation was an integral factor for scheduling further treatment.  Employment, transportation, bed availability, health status, family support, and any pending legal issues were also considered during his hospital stay.  Upon completion of this admission the patient was both mentally and medically stable for discharge denying suicidal/homicidal ideation, auditory/visual/tactile hallucinations, delusional thoughts and paranoia.       Physical Findings: AIMS: Facial and Oral Movements Muscles of Facial Expression: None, normal Lips and Perioral Area: None, normal Jaw: None, normal Tongue: None, normal,Extremity Movements Upper (arms, wrists, hands, fingers): None, normal Lower (legs, knees, ankles, toes): None, normal, Trunk Movements Neck, shoulders, hips: None, normal, Overall Severity Severity of abnormal movements (highest score from questions above): None, normal Incapacitation due to abnormal movements: None, normal Patient's awareness of abnormal movements (rate only patient's report): No Awareness, Dental Status Current problems with teeth and/or dentures?: Yes Does patient usually wear dentures?: No  CIWA:  CIWA-Ar Total: 1 COWS:  COWS Total Score: 1  Musculoskeletal: Strength & Muscle Tone: within normal limits Gait & Station: normal Patient leans: N/A  Psychiatric Specialty Exam:  SEE MD SRA Physical Exam  Nursing note and vitals reviewed.   Review of Systems  All other systems reviewed and are negative.   Blood pressure 141/84, pulse 62, temperature 98.4 F (36.9 C), temperature source Oral, resp. rate 18, height 5\' 9"  (1.753 m), weight 82.555 kg (182 lb).Body mass index is 26.86 kg/(m^2).    Have you used any form of tobacco in the last 30 days? (Cigarettes, Smokeless Tobacco, Cigars, and/or Pipes): Yes  Has this  patient used any form of tobacco in the last 30 days? (Cigarettes, Smokeless Tobacco, Cigars, and/or Pipes)  NA  Blood Alcohol level:  Lab Results  Component Value Date   [facility] <5 09/06/2015   ETH <5 [id number]    Metabolic Disorder Labs:  Lab Results  Component Value Date   HGBA1C 5.3 02/06/2015   MPG 105 02/06/2015   MPG 105 01/30/2015   No results found for: PROLACTIN Lab Results  Component Value Date   CHOL 156 03/01/2015   TRIG 60 03/01/2015   HDL 51 03/01/2015   CHOLHDL 3.1 03/01/2015   VLDL 12 03/01/2015   LDLCALC 93 03/01/2015   [place] 95 02/06/2015    See Psychiatric Specialty Exam and Suicide Risk Assessment completed by Attending Physician prior to discharge.  Discharge destination:  Home  Is patient on multiple antipsychotic therapies at discharge:  No   Has Patient had three or more failed trials of antipsychotic monotherapy by history:  No  Recommended Plan for Multiple Antipsychotic Therapies: NA     Medication List    STOP taking these medications        naproxen sodium 220 MG tablet  Commonly known as:  ANAPROX      TAKE these medications  Indication   amLODipine 10 MG tablet  Commonly known as:  NORVASC  Take 1 tablet (10 mg total) by mouth daily.   Indication:  High Blood Pressure     aspirin EC 81 MG tablet  Take 1 tablet (81 mg total) by mouth daily.   Indication:  cardiac health     carvedilol 25 MG tablet  Commonly known as:  COREG  Take 1 tablet (25 mg total) by mouth 2 (two) times daily with a meal.   Indication:  High Blood Pressure of Unknown Cause     efavirenz-emtricitabine-tenofovir 600-200-300 MG tablet  Commonly known as:  ATRIPLA  Take 1 tablet by mouth at bedtime.   Indication:  HIV Disease     lisinopril 10 MG tablet  Commonly known as:  PRINIVIL,ZESTRIL  Take 1 tablet (10 mg total) by mouth daily.   Indication:  High Blood Pressure     nicotine polacrilex 2 MG gum  Commonly known as:  NICORETTE  Take 1  each (2 mg total) by mouth as needed for smoking cessation.   Indication:  Nicotine Addiction     nitroGLYCERIN 0.4 MG SL tablet  Commonly known as:  NITROSTAT  Place 1 tablet (0.4 mg total) under the tongue every 5 (five) minutes as needed for chest pain.   Indication:  Acute Angina Pectoris     pantoprazole 40 MG tablet  Commonly known as:  PROTONIX  Take 1 tablet (40 mg total) by mouth daily.   Indication:  Gastroesophageal Reflux Disease     PARoxetine 30 MG tablet  Commonly known as:  PAXIL  Take 1 tablet (30 mg total) by mouth daily.   Indication:  Generalized Anxiety Disorder, Major Depressive Disorder     rosuvastatin 20 MG tablet  Commonly known as:  CRESTOR  Take 1 tablet (20 mg total) by mouth daily.   Indication:  Disease of the Heart and Blood Vessels     zolpidem 5 MG tablet  Commonly known as:  AMBIEN  Take 1 tablet (5 mg total) by mouth at bedtime as needed for sleep.   Indication:  Trouble Sleeping           Follow-up Information    Follow up with [facility] On 10/18/2015.   Why:  7/31 at 12:00pm for medication management with [name], NP.  7/13 at 1:00pm for therapy with Latoya    Contact information:   [id number] [place] [address] [phone]      Follow up with [facility]              On 09/17/2015.   Contact information:   [address] [place] [phone]       Follow-up recommendations:  Activity:  as tol Diet:  as tol  Comments:  1.  Take all your medications as prescribed.   2.  Report any adverse side effects to outpatient provider. 3.  Patient instructed to not use alcohol or illegal drugs while on prescription medicines. 4.  In the event of worsening symptoms, instructed patient to call 911, the crisis hotline or go to nearest emergency room for evaluation of symptoms.  Signed: Janett Labella, NP [facility] 09/17/2015, 4:17 PM

## 2015-09-17 NOTE — BHH Suicide Risk Assessment (Signed)
[facility] Discharge Suicide Risk Assessment   Principal Problem: Cocaine dependence with cocaine-induced mood disorder [facility]) Discharge Diagnoses:  Patient Active Problem List   Diagnosis Date Noted  . Cocaine dependence with cocaine-induced mood disorder ([place]) [F14.24] 09/07/2015  . Severe episode of recurrent major depressive disorder, without psychotic features ([place]) [F33.2]   . Hyperlipidemia LDL goal <130 [E78.5] 03/01/2015  . Orchalgia [N50.819]   . Testicular pain, right [N50.811]   . HIV (human immunodeficiency virus infection) ([place]) [Z21] 02/05/2015  . CAD (coronary artery disease) [I25.10] 02/05/2015  . Chest pain [R07.9] 02/05/2015  . Suicidal ideations [R45.851] 02/05/2015  . Hypertension [I10] 02/05/2015  . Anxiety [F41.9]   . Pain in the chest [R07.9]   . Depression [F32.9]   . Suicidal ideation [R45.851]     Total Time spent with patient: 30 minutes  Musculoskeletal: Strength & Muscle Tone: within normal limits Gait & Station: normal Patient leans: N/A  Psychiatric Specialty Exam: Review of Systems  Psychiatric/Behavioral: Positive for substance abuse. Negative for depression.  All other systems reviewed and are negative.   Blood pressure 141/84, pulse 62, temperature 98.4 F (36.9 C), temperature source Oral, resp. rate 18, height 5\' 9"  (1.753 m), weight 82.555 kg (182 lb).Body mass index is 26.86 kg/(m^2).  General Appearance: Fairly Groomed  Engineer, water::  Fair  Speech:  Clear and Coherent409  Volume:  Normal  Mood:  Euthymic  Affect:  Congruent  Thought Process:  Goal Directed and Descriptions of Associations: Intact  Orientation:  Full (Time, Place, and Person)  Thought Content:  Logical  Suicidal Thoughts:  No  Homicidal Thoughts:  No  Memory:  Immediate;   Fair Recent;   Fair Remote;   Fair  Judgement:  Fair  Insight:  Fair  Psychomotor Activity:  Normal  Concentration:  Fair  Recall:  [employer] of Knowledge:Fair  Language: Fair  Akathisia:   No  Handed:  Right  AIMS (if indicated):     Assets:  Desire for Improvement  Sleep:  Number of Hours: 4  Cognition: WNL  ADL's:  Intact   Mental Status Per Nursing Assessment::   On Admission:     Demographic Factors:  Male  Loss Factors: NA  Historical Factors: Impulsivity  Risk Reduction Factors:   Positive therapeutic relationship  Continued Clinical Symptoms:  Alcohol/Substance Abuse/Dependencies  Cognitive Features That Contribute To Risk:  None    Suicide Risk:  Minimal: No identifiable suicidal ideation.  Patients presenting with no risk factors but with morbid ruminations; may be classified as minimal risk based on the severity of the depressive symptoms  Follow-up Information    Follow up with [facility] On 10/18/2015.   Why:  7/31 at 12:00pm for medication management with [name], NP.  7/13 at 1:00pm for therapy with Latoya    Contact information:   [id number] [address] [place] [address] [phone]      Plan Of Care/Follow-up recommendations:  Activity:   NO RESTRICTIONS Diet:  REGULAR Tests:  AS NEEDED Other:  FOLLOW UP WITH AFTERCARE  Chloe Flis, MD 09/17/2015, 11:27 AM

## 2015-09-17 NOTE — BHH Group Notes (Signed)
[facility] LCSW Aftercare Discharge Planning Group Note  09/17/2015 8:45 AM  Participation Quality: Alert, Appropriate and Oriented  Mood/Affect: Appropriate  Depression Rating: 5  Anxiety Rating: 5  Thoughts of Suicide: Pt denies SI/HI  Will you contract for safety? Yes  Current AVH: Pt denies  Plan for Discharge/Comments: Pt attended discharge planning group and actively participated in group. CSW discussed suicide prevention education with the group and encouraged them to discuss discharge planning and any relevant barriers. Pt continues to be future-oriented however expresses anxiety related to the next few days until he gets his check.  Transportation Means: Pt reports access to transportation  Supports: No supports mentioned at this time  [name], [place] 09/17/2015 10:02 AM

## 2015-09-20 ENCOUNTER — Emergency Department (HOSPITAL_COMMUNITY): Payer: Medicare Other

## 2015-09-20 ENCOUNTER — Encounter (HOSPITAL_COMMUNITY): Payer: Self-pay | Admitting: Emergency Medicine

## 2015-09-20 ENCOUNTER — Encounter (HOSPITAL_COMMUNITY): Payer: Self-pay | Admitting: *Deleted

## 2015-09-20 ENCOUNTER — Emergency Department (HOSPITAL_COMMUNITY)
Admission: EM | Admit: 2015-09-20 | Discharge: 2015-09-20 | Disposition: A | Discharge status: Home / Self Care | Payer: Medicare Other | Source: Home / Self Care | Attending: Emergency Medicine | Admitting: Emergency Medicine

## 2015-09-20 ENCOUNTER — Emergency Department (HOSPITAL_COMMUNITY)
Admission: EM | Admit: 2015-09-20 | Discharge: 2015-09-21 | Disposition: A | Discharge status: Other Acute Inpatient Hospital | Payer: Medicare Other | Attending: Emergency Medicine | Admitting: Emergency Medicine

## 2015-09-20 DIAGNOSIS — I10 Essential (primary) hypertension: Secondary | ICD-10-CM

## 2015-09-20 DIAGNOSIS — Z7982 Long term (current) use of aspirin: Secondary | ICD-10-CM | POA: Insufficient documentation

## 2015-09-20 DIAGNOSIS — Z79899 Other long term (current) drug therapy: Secondary | ICD-10-CM | POA: Insufficient documentation

## 2015-09-20 DIAGNOSIS — R45851 Suicidal ideations: Secondary | ICD-10-CM | POA: Insufficient documentation

## 2015-09-20 DIAGNOSIS — F329 Major depressive disorder, single episode, unspecified: Secondary | ICD-10-CM | POA: Insufficient documentation

## 2015-09-20 DIAGNOSIS — F17210 Nicotine dependence, cigarettes, uncomplicated: Secondary | ICD-10-CM | POA: Insufficient documentation

## 2015-09-20 DIAGNOSIS — Z8547 Personal history of malignant neoplasm of testis: Secondary | ICD-10-CM | POA: Diagnosis not present

## 2015-09-20 DIAGNOSIS — R079 Chest pain, unspecified: Secondary | ICD-10-CM

## 2015-09-20 DIAGNOSIS — F332 Major depressive disorder, recurrent severe without psychotic features: Secondary | ICD-10-CM | POA: Diagnosis present

## 2015-09-20 DIAGNOSIS — Z789 Other specified health status: Secondary | ICD-10-CM

## 2015-09-20 DIAGNOSIS — E7800 Pure hypercholesterolemia, unspecified: Secondary | ICD-10-CM | POA: Insufficient documentation

## 2015-09-20 DIAGNOSIS — F32A Depression, unspecified: Secondary | ICD-10-CM

## 2015-09-20 DIAGNOSIS — F1099 Alcohol use, unspecified with unspecified alcohol-induced disorder: Secondary | ICD-10-CM | POA: Diagnosis not present

## 2015-09-20 DIAGNOSIS — F1490 Cocaine use, unspecified, uncomplicated: Secondary | ICD-10-CM | POA: Diagnosis not present

## 2015-09-20 DIAGNOSIS — I252 Old myocardial infarction: Secondary | ICD-10-CM

## 2015-09-20 DIAGNOSIS — Z859 Personal history of malignant neoplasm, unspecified: Secondary | ICD-10-CM

## 2015-09-20 DIAGNOSIS — Z7289 Other problems related to lifestyle: Secondary | ICD-10-CM

## 2015-09-20 DIAGNOSIS — Z720 Tobacco use: Secondary | ICD-10-CM

## 2015-09-20 LAB — RAPID URINE DRUG SCREEN, HOSP PERFORMED
Amphetamines: NOT DETECTED
BARBITURATES: NOT DETECTED
Benzodiazepines: NOT DETECTED
Cocaine: POSITIVE — AB
Opiates: NOT DETECTED
Tetrahydrocannabinol: POSITIVE — AB

## 2015-09-20 LAB — BASIC METABOLIC PANEL
Anion gap: 9 (ref 5–15)
BUN: 16 mg/dL (ref 6–20)
CALCIUM: 9.6 mg/dL (ref 8.9–10.3)
CO2: 22 mmol/L (ref 22–32)
Chloride: 106 mmol/L (ref 101–111)
Creatinine, Ser: 0.9 mg/dL (ref 0.61–1.24)
GFR calc Af Amer: >60 mL/min (ref >60)
GLUCOSE: 132 mg/dL — AB (ref 65–99)
Potassium: 3 mmol/L — ABNORMAL LOW (ref 3.5–5.1)
SODIUM: 137 mmol/L (ref 135–145)

## 2015-09-20 LAB — I-STAT TROPONIN, ED: TROPONIN I, POC: 0.01 ng/mL (ref 0.00–0.08)

## 2015-09-20 LAB — CBC
HCT: 39.6 % (ref 39.0–52.0)
Hemoglobin: 13.3 g/dL (ref 13.0–17.0)
MCH: 31.4 pg (ref 26.0–34.0)
MCHC: 33.6 g/dL (ref 30.0–36.0)
MCV: 93.6 fL (ref 78.0–100.0)
PLATELETS: 168 10*3/uL (ref 150–400)
RBC: 4.23 MIL/uL (ref 4.22–5.81)
RDW: 13.2 % (ref 11.5–15.5)
WBC: 5.8 10*3/uL (ref 4.0–10.5)

## 2015-09-20 LAB — HEPATIC FUNCTION PANEL
ALBUMIN: 3.5 g/dL (ref 3.5–5.0)
ALT: 53 U/L (ref 17–63)
AST: 52 U/L — AB (ref 15–41)
Alkaline Phosphatase: 92 U/L (ref 38–126)
BILIRUBIN TOTAL: 0.8 mg/dL (ref 0.3–1.2)
Bilirubin, Direct: 0.5 mg/dL (ref 0.1–0.5)
Indirect Bilirubin: 0.3 mg/dL (ref 0.3–0.9)
TOTAL PROTEIN: 6.9 g/dL (ref 6.5–8.1)

## 2015-09-20 LAB — SALICYLATE LEVEL

## 2015-09-20 LAB — ETHANOL

## 2015-09-20 LAB — ACETAMINOPHEN LEVEL: Acetaminophen (Tylenol), Serum: <10 ug/mL — ABNORMAL LOW (ref 10–30)

## 2015-09-20 MED ORDER — PANTOPRAZOLE SODIUM 40 MG PO TBEC
40.0000 mg | DELAYED_RELEASE_TABLET | Freq: Every day | ORAL | Status: DC
  Administered 2015-09-20 – 2015-09-21 (×2): 40 mg via ORAL
  Filled 2015-09-20 (×2): qty 1

## 2015-09-20 MED ORDER — IBUPROFEN 200 MG PO TABS: 600.0000 mg | ORAL_TABLET | Freq: Three times a day (TID) | ORAL | Status: DC | PRN

## 2015-09-20 MED ORDER — ONDANSETRON HCL 4 MG PO TABS: 4.0000 mg | ORAL_TABLET | Freq: Three times a day (TID) | ORAL | Status: DC | PRN

## 2015-09-20 MED ORDER — LORAZEPAM 1 MG PO TABS
1.0000 mg | ORAL_TABLET | Freq: Three times a day (TID) | ORAL | Status: DC | PRN
Start: 2015-09-20 — End: 2015-09-21

## 2015-09-20 MED ORDER — EFAVIRENZ-EMTRICITAB-TENOFOVIR 600-200-300 MG PO TABS
1.0000 | ORAL_TABLET | Freq: Every day | ORAL | Status: DC
  Administered 2015-09-20: 1 via ORAL
  Filled 2015-09-20 (×2): qty 1

## 2015-09-20 MED ORDER — LISINOPRIL 10 MG PO TABS
10.0000 mg | ORAL_TABLET | Freq: Every day | ORAL | Status: DC
  Administered 2015-09-20 – 2015-09-21 (×2): 10 mg via ORAL
  Filled 2015-09-20 (×2): qty 1

## 2015-09-20 MED ORDER — ACETAMINOPHEN 325 MG PO TABS: 650.0000 mg | ORAL_TABLET | ORAL | Status: DC | PRN

## 2015-09-20 MED ORDER — ALUM & MAG HYDROXIDE-SIMETH 200-200-20 MG/5ML PO SUSP: 30.0000 mL | ORAL | Status: DC | PRN

## 2015-09-20 MED ORDER — ZOLPIDEM TARTRATE 5 MG PO TABS: 5.0000 mg | ORAL_TABLET | Freq: Every evening | ORAL | Status: DC | PRN

## 2015-09-20 MED ORDER — NICOTINE 21 MG/24HR TD PT24: 21.0000 mg | MEDICATED_PATCH | Freq: Every day | TRANSDERMAL | Status: DC

## 2015-09-20 MED ORDER — PAROXETINE HCL 30 MG PO TABS
30.0000 mg | ORAL_TABLET | Freq: Every day | ORAL | Status: DC
  Administered 2015-09-20 – 2015-09-21 (×2): 30 mg via ORAL
  Filled 2015-09-20 (×2): qty 1

## 2015-09-20 MED ORDER — CARVEDILOL 25 MG PO TABS
25.0000 mg | ORAL_TABLET | Freq: Two times a day (BID) | ORAL | Status: DC
  Administered 2015-09-20 – 2015-09-21 (×3): 25 mg via ORAL
  Filled 2015-09-20 (×3): qty 1

## 2015-09-20 MED ORDER — ROSUVASTATIN CALCIUM 20 MG PO TABS
20.0000 mg | ORAL_TABLET | Freq: Every day | ORAL | Status: DC
  Administered 2015-09-20 – 2015-09-21 (×2): 20 mg via ORAL
  Filled 2015-09-20 (×2): qty 1

## 2015-09-20 MED ORDER — ASPIRIN EC 81 MG PO TBEC
81.0000 mg | DELAYED_RELEASE_TABLET | Freq: Every day | ORAL | Status: DC
  Administered 2015-09-20 – 2015-09-21 (×2): 81 mg via ORAL
  Filled 2015-09-20 (×2): qty 1

## 2015-09-20 MED ORDER — AMLODIPINE BESYLATE 10 MG PO TABS
10.0000 mg | ORAL_TABLET | Freq: Every day | ORAL | Status: DC
  Administered 2015-09-20 – 2015-09-21 (×2): 10 mg via ORAL
  Filled 2015-09-20 (×2): qty 1

## 2015-09-20 NOTE — ED Notes (Signed)
Patient has 1 patient belonging bag filled with clothing and personal items. Patient and belongings have been wanded by security

## 2015-09-20 NOTE — Discharge Instructions (Signed)
Community Resource Guide Outpatient Counseling/Substance Abuse Adult °The United Way’s “211” is a great source of information about community services available.  Access by dialing 2-1-1 from anywhere in [place], or by website -  www.nc211.org.  ° °Other Local Resources (Updated 03/2015) ° °Crisis Hotlines °  °Services  ° °  °Area Served  °[facility] • Crisis Hotline, available 24 hours a day, 7 days a week: 800-939-5911 [place], [place]  ° [facility] • Crisis Hotline, available 24 hours a day, 7 days a week: 866-275-9552 [place], [place]  °[facility] • Suicide Prevention Hotline, available 24 hours a day, 7 days a week: 800-273-8255 [place], [place]  °[facility] ° • Crisis Hotline, available 24 hours a day, 7 days a week: 336-676-6840 [place], [place] °  °[facility] • Crisis Hotline, available 24 hours a day, 7 days a week: 800-256-2452 All °  °[facility] • Crisis Hotline, available 24 hours a day, 7 days a week: 877-626-1772 All  ° °Other Local Resources (Updated 03/2015) ° °Outpatient Counseling/ Substance Abuse Programs  °Services  ° °  °Address and Phone Number  °[facility] ° • Options include Individual counseling, group counseling, intensive outpatient program (several hours a day, several days a week) °• Offers depression assessments °• Provides methadone maintenance program 336-333-6860 °[address], [address] °[place], [place] [address] °  °[facility] ° • Offers partial hospitalization/day treatment and DUI/DWI programs °• Accepts Medicare, private insurance 336-299-4655 °[address], [address] °[place], [place] [address]  °[facility] ° ° • Services include intensive outpatient program (several hours a day, several days a week), outpatient treatment, DUI/DWI services, family education °• Also has some services specifically for Veterans °• Offers transitional housing   336-886-5594 °[address] °[place], [place] [address] °  °  ° [facility] • Accepts Medicare, private pay, and private insurance 336-272-0855 °[address] °[place], [place] [address]  °[facility] • Services include individual counseling, substance abuse intensive outpatient program (several hours a day, several days a week), day treatment °• Accepts Medicare, Medicaid, private insurance 336-271-5888 °[address] °[place], [place] [address]  °[facility] ° • Offers substance abuse intensive outpatient program (several hours a day, several days a week), partial hospitalization program 336-832-9800 °[address] °[place], [place] [address] ° °336-349-4454 °[address] °[place], [place] [address] ° °336-386-3795 °[address] °[place], [place] [address] ° °336-993-6120 °[address] °[place], [place] [address]  °[facility] • Individual counseling only °• Accepts private insurance only 336-292-1510 °[address] °[place], [place] [address]  °[facility] • Methadone maintenance program 800-805-6989 °[address] °[place], [place] [address]  °[facility] • Walk-In Clinic providing substance abuse and mental health counseling °• Accepts Medicaid, Medicare, private insurance °• Offers sliding scale for uninsured 336-342-8316 °[address] °[place], [place]   °[facility] • Offers individual counseling, and intensive in-home services 336-347-7415 °[address] °[place], [place] [address]  °[facility] • Offers individual counseling, family counseling, group therapy, domestic violence counseling, consumer credit counseling °• Accepts Medicare, Medicaid, private insurance °• Offers sliding scale for uninsured 336-387-6161 °[address] °[place], [place] [address] ° °336-889-6161 °[place], [address] °[place], [place] [address]  °[facility] • Offers individual, family   and group counseling °• 3 locations - [place], [place], and [place] ° 336-899-8800 ° °[address] °[place], [place] [address] ° °[address] °[place], [place] [address] ° °[address] °[place], [place] [address]  °[facility]  ° • Offers psychiatric assessment, 8-week Intensive Outpatient Program (several hours a day, several times a week, daytime or evenings), early recovery group, family Program, medication management °• Private pay or private insurance only 336 -621-3381, or  °800-659-3381 °[address] °[place], [place] [address]  °[facility] • Offers individual, couples and family counseling °• Accepts Medicaid, private insurance, and sliding scale for uninsured 336-542-2076 °[address] °[place], [place] [address]  °David Fuller, MD • Individual counseling °• Private insurance 336-852-4051 °[address] °[place], [place] [address]  °[facility] ° • Offers assessment, substance abuse treatment, and behavioral health treatment 336-878-6098 °[address] °[place], [place] [address]  °[facility] • Individual counseling °• Accepts private insurance 336-272-1972 °[address] °[place], [place] [address]  °[facility] • Individual counseling °• Accepts Medicare, private insurance 336-547-1574 °[address] °[place], [place] [address]  °[facility]  ° • Offers intensive outpatient program (several hours a day, several times a week) °• Private pay, private insurance [phone] °[address] °[place], [place]  °[facility] • Individual counseling °• Medicare, private insurance 336-505-9494 °[address] °[place], [place] [address]  °[facility]  ° • Offers intensive outpatient program (several hours a day, several times a week) and partial hospitalization  program 336-794-3550 °[address] °[place], [place] [address]  °Parrish McKinney, MD • Individual counseling 336-282-1251 °[address] °[place], [place] [address]  °[facility] • Offers Christian counseling to individuals, couples, and families °• Accepts Medicare and private insurance; offers sliding scale for uninsured 336-288-1484 °[address] °[place], [place] [address]  °[facility] • Christian counseling 336-542-2060 °[address] °[place], [place] [address]  °[facility] ° • Offers crisis counseling, individual counseling, group therapy, in-home therapy, domestic violence services, day treatment, DWI services, Community Support Team (CST), Assertive Community Treatment Team (ACTT), substance abuse Intensive Outpatient Program (several hours a day, several times a week) °• 2 locations - [place] and [place] 336-229-5905 °[address] °[place], [place] [address] ° °336-694-1777 °[address] °[place], [place] [address]  °[facility]  ° ° • Individual counseling and group therapy °• Accepts private insurance, Medicare, Medicaid 336-379-7146 °[address] °[place], [place]  °[facility] • Offers individual and family counseling °• Offers LGBTQ services °• Accepts private insurance and private pay 336-288-9190 °[address] °[place], [place] [address]  °[facility]  ° • Offers individual counseling, group therapy, and outpatient detox °• Accepts private insurance 336-389-1413 °[address] °[place], [place]  °[facility] • Individual counseling °• Accepts Medicare, private insurance 336-632-3505 °[address] °[place], [place] [address]  °[facility] • Individual counseling °• Accepts Medicare, private insurance 336-570-0104 °[address] °[place], [place] [address]  °[facility] ° • Offers substance abuse  Intensive Outpatient Program (several hours a day, several times a week) [phone], or °[phone] °[place],   ° °
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
## 2015-09-20 NOTE — ED Provider Notes (Signed)
CSN: [id number]     Arrival date & time 09/20/15  1634 History   First MD Initiated Contact with Patient 09/20/15 1704     No chief complaint on file.    (Consider location/radiation/quality/duration/timing/severity/associated sxs/prior Treatment) HPI Comments: Morgan Mclane is a 55 y.o. male with a PMHx of HIV, angina, anxiety, depression, HLD, and prior MI, who presents to the ED with complaints of ongoing suicidal ideations and requesting detox. He was seen at [facility] ER this morning for chest pain and shortness of breath after smoking $400 worth of crack cocaine, told the provider that he was suicidal, TTS was consulted and apparently he told them that he wasn't having SI/HI so they stated he did not meet IP criteria and gave him outpatient f/up instructions. He was upset and left stating that he needed help for his depression. He came back here today continuing to report that he feels depressed and suicidal. When asked what his plan would be for suicide, he states that he would overdose. He states that he doesn't typically use as much crack cocaine as he did last night, and states this is his "cry for help" related to his depression and suicidal feelings. His last use of cocaine was at 4 AM, and his last alcohol consumption was two 40 ounce beers at 4 AM. He denies HI/AVH. He states he hasn't been compliant with his psychiatric medications because he hasn't filled them since he was discharged from here last Friday. He does endorse smoking cigarettes. He is here voluntarily. He states that his chest pain or shortness of breath has since resolved, and he has no medical complaints at this time.  When I asked him why he told TTS that he wasn't having SI, he said he "thought they understood because he told the other person (EDP)".  Patient is a 55 y.o. male presenting with mental health disorder. The history is provided by the patient and medical records. No language interpreter was used.  Mental  Health Problem Presenting symptoms: depression and suicidal thoughts   Presenting symptoms: no hallucinations and no homicidal ideas   Onset quality:  Gradual Timing:  Constant Progression:  Worsening Chronicity:  Recurrent Context: alcohol use, drug abuse and noncompliance   Treatment compliance:  Untreated Time since last psychoactive medication taken:  3 days Relieved by:  None tried Worsened by:  Alcohol and drugs Ineffective treatments:  None tried Associated symptoms: no abdominal pain and no chest pain (improved)   Risk factors: hx of mental illness and recent psychiatric admission     Past Medical History  Diagnosis Date  . HIV infection ([place])   . Cancer ([place])   . Hypertension   . Angina pectoris ([place])   . Scrotal cyst   . Anxiety   . Depression   . Hypercholesteremia   . Myocardial infarct, old    Past Surgical History  Procedure Laterality Date  . Scrotal turmor removed     Family History  Problem Relation Age of Onset  . Hypertension Other   . CAD Sister    Social History  Substance Use Topics  . Smoking status: Current Some Day Smoker -- 0.50 packs/day    Types: Cigars, Cigarettes  . Smokeless tobacco: None  . Alcohol Use: No     Comment: one beer weekly    Review of Systems  Constitutional: Negative for fever and chills.  Respiratory: Negative for shortness of breath (improved).   Cardiovascular: Negative for chest pain (improved).  Gastrointestinal: Negative  for nausea, vomiting, abdominal pain, diarrhea and constipation.  Genitourinary: Negative for dysuria and hematuria.  Musculoskeletal: Negative for myalgias and arthralgias.  Skin: Negative for color change.  Allergic/Immunologic: Positive for immunocompromised state (HIV+).  Neurological: Negative for weakness and numbness.  Psychiatric/Behavioral: Positive for suicidal ideas. Negative for homicidal ideas, hallucinations and confusion.   10 Systems reviewed and are negative for acute  change except as noted in the HPI.    Allergies  Review of patient's allergies indicates no known allergies.  Home Medications   Prior to Admission medications   Medication Sig Start Date End Date Taking? Authorizing Provider  amLODipine (NORVASC) 10 MG tablet Take 1 tablet (10 mg total) by mouth daily. 09/17/15   Kerrie Buffalo, NP  aspirin EC 81 MG tablet Take 1 tablet (81 mg total) by mouth daily. 09/17/15   Kerrie Buffalo, NP  carvedilol (COREG) 25 MG tablet Take 1 tablet (25 mg total) by mouth 2 (two) times daily with a meal. 09/17/15   Kerrie Buffalo, NP  efavirenz-emtricitabine-tenofovir (ATRIPLA) 600-200-300 MG tablet Take 1 tablet by mouth at bedtime. 09/17/15   Kerrie Buffalo, NP  lisinopril (PRINIVIL,ZESTRIL) 10 MG tablet Take 1 tablet (10 mg total) by mouth daily. 09/17/15   Kerrie Buffalo, NP  nicotine polacrilex (NICORETTE) 2 MG gum Take 1 each (2 mg total) by mouth as needed for smoking cessation. 09/17/15   Kerrie Buffalo, NP  nitroGLYCERIN (NITROSTAT) 0.4 MG SL tablet Place 1 tablet (0.4 mg total) under the tongue every 5 (five) minutes as needed for chest pain. 09/17/15   Kerrie Buffalo, NP  pantoprazole (PROTONIX) 40 MG tablet Take 1 tablet (40 mg total) by mouth daily. 09/17/15   Kerrie Buffalo, NP  PARoxetine (PAXIL) 30 MG tablet Take 1 tablet (30 mg total) by mouth daily. 09/17/15   Kerrie Buffalo, NP  rosuvastatin (CRESTOR) 20 MG tablet Take 1 tablet (20 mg total) by mouth daily. 09/17/15   Kerrie Buffalo, NP  zolpidem (AMBIEN) 5 MG tablet Take 1 tablet (5 mg total) by mouth at bedtime as needed for sleep. 09/17/15   Kerrie Buffalo, NP   BP 133/100 mmHg  Pulse 74  Temp(Src) 98.2 F (36.8 C) (Oral)  Resp 18  SpO2 99% Physical Exam  Constitutional: He is oriented to person, place, and time. Vital signs are normal. He appears well-developed and well-nourished.  Non-toxic appearance. No distress.  Afebrile, nontoxic, NAD  HENT:  Head: Normocephalic and atraumatic.   Mouth/Throat: Oropharynx is clear and moist and mucous membranes are normal.  Eyes: Conjunctivae and EOM are normal. Right eye exhibits no discharge. Left eye exhibits no discharge.  Neck: Normal range of motion. Neck supple.  Cardiovascular: Normal rate, regular rhythm, normal heart sounds and intact distal pulses.  Exam reveals no gallop and no friction rub.   No murmur heard. Pulmonary/Chest: Effort normal and breath sounds normal. No respiratory distress. He has no decreased breath sounds. He has no wheezes. He has no rhonchi. He has no rales.  Abdominal: Soft. Normal appearance and bowel sounds are normal. He exhibits no distension. There is no tenderness. There is no rigidity, no rebound, no guarding, no CVA tenderness, no tenderness at McBurney's point and negative Murphy's sign.  Musculoskeletal: Normal range of motion.  Neurological: He is alert and oriented to person, place, and time. He has normal strength. No sensory deficit.  Skin: Skin is warm, dry and intact. No rash noted.  Psychiatric: He is not actively hallucinating. He exhibits a depressed mood. He expresses  suicidal ideation. He expresses no homicidal ideation. He expresses suicidal plans. He expresses no homicidal plans.  Depressed affect, endorsing SI with a plan to OD. Denies HI/AVH  Nursing note and vitals reviewed.   ED Course  Procedures (including critical care time) Labs Review Labs Reviewed - No data to display Results for orders placed or performed during the hospital encounter of [id number]  Basic metabolic panel  Result Value Ref Range   Sodium 137 135 - 145 mmol/L   Potassium 3.0 (L) 3.5 - 5.1 mmol/L   Chloride 106 101 - 111 mmol/L   CO2 22 22 - 32 mmol/L   Glucose, Bld 132 (H) 65 - 99 mg/dL   BUN 16 6 - 20 mg/dL   Creatinine, Ser 0.90 0.61 - 1.24 mg/dL   Calcium 9.6 8.9 - 10.3 mg/dL   GFR calc non Af Amer >60 >60 mL/min   GFR calc Af Amer >60 >60 mL/min   Anion gap 9 5 - 15  CBC  Result Value Ref  Range   WBC 5.8 4.0 - 10.5 K/uL   RBC 4.23 4.22 - 5.81 MIL/uL   Hemoglobin 13.3 13.0 - 17.0 g/dL   HCT 39.6 39.0 - 52.0 %   MCV 93.6 78.0 - 100.0 fL   MCH 31.4 26.0 - 34.0 pg   MCHC 33.6 30.0 - 36.0 g/dL   RDW 13.2 11.5 - 15.5 %   Platelets 168 150 - 400 K/uL  Ethanol  Result Value Ref Range   Alcohol, Ethyl (B) <5 <5 mg/dL  Urine rapid drug screen (hosp performed)not at [facility]  Result Value Ref Range   Opiates NONE DETECTED NONE DETECTED   Cocaine POSITIVE (A) NONE DETECTED   Benzodiazepines NONE DETECTED NONE DETECTED   Amphetamines NONE DETECTED NONE DETECTED   Tetrahydrocannabinol POSITIVE (A) NONE DETECTED   Barbiturates NONE DETECTED NONE DETECTED  Salicylate level  Result Value Ref Range   Salicylate Lvl 123456 2.8 - 30.0 mg/dL  Acetaminophen level  Result Value Ref Range   Acetaminophen (Tylenol), Serum <10 (L) 10 - 30 ug/mL  Hepatic function panel  Result Value Ref Range   Total Protein 6.9 6.5 - 8.1 g/dL   Albumin 3.5 3.5 - 5.0 g/dL   AST 52 (H) 15 - 41 U/L   ALT 53 17 - 63 U/L   Alkaline Phosphatase 92 38 - 126 U/L   Total Bilirubin 0.8 0.3 - 1.2 mg/dL   Bilirubin, Direct 0.5 0.1 - 0.5 mg/dL   Indirect Bilirubin 0.3 0.3 - 0.9 mg/dL  I-stat troponin, ED  Result Value Ref Range   Troponin i, poc 0.01 0.00 - 0.08 ng/mL   Comment 3            Imaging Review Dg Chest 2 View  09/20/2015  CLINICAL DATA:  Chest pain starting Friday, shortness of Breath EXAM: CHEST  2 VIEW COMPARISON:  09/06/2015 FINDINGS: Cardiomediastinal silhouette is stable. No acute infiltrate or pleural effusion. Bony thorax is unremarkable. IMPRESSION: No active cardiopulmonary disease. Electronically Signed   By: Lahoma Crocker M.D.   On: 09/20/2015 07:50   I have personally reviewed and evaluated these images and lab results as part of my medical decision-making.   EKG Interpretation None      MDM   Final diagnoses:  Suicidal ideation  Cocaine use  Alcohol use (HCC)  Essential  hypertension  Tobacco use    55 y.o. male here with ongoing SI and requesting detox and inpatient help for his SI.  Was seen at [facility] this morning with c/o CP/SOB as well as depression/SI. TTS consulted and apparently they reported that he denied SI/HI, stated he did not meet IP criteria and was discharged with outpt resources. He left angrily because of this. He states to me that he told TTS that he thought they understood what he was saying when he reported that he was using cocaine heavily. He had screening labs earlier and was medically cleared. Labs reviewed and I still feel he is medically cleared. Will reorder home meds and psych hold meds, and consult TTS again given this information that he is in fact suicidal with a plan to OD. Denies HI/AVH. Please see TTS consult notes for further documentation of care/dispo.  BP 133/100 mmHg  Pulse 74  Temp(Src) 98.2 F (36.8 C) (Oral)  Resp 18  SpO2 99%  Meds ordered this encounter  Medications  . alum & mag hydroxide-simeth (MAALOX/MYLANTA) 200-200-20 MG/5ML suspension 30 mL    Sig:   . ondansetron (ZOFRAN) tablet 4 mg    Sig:   . nicotine (NICODERM CQ - dosed in mg/24 hours) patch 21 mg    Sig:   . DISCONTD: zolpidem (AMBIEN) tablet 5 mg    Sig:   . ibuprofen (ADVIL,MOTRIN) tablet 600 mg    Sig:   . acetaminophen (TYLENOL) tablet 650 mg    Sig:   . LORazepam (ATIVAN) tablet 1 mg    Sig:   . amLODipine (NORVASC) tablet 10 mg    Sig:   . aspirin EC tablet 81 mg    Sig:   . carvedilol (COREG) tablet 25 mg    Sig:   . efavirenz-emtricitabine-tenofovir (ATRIPLA) 600-200-300 MG per tablet 1 tablet    Sig:   . lisinopril (PRINIVIL,ZESTRIL) tablet 10 mg    Sig:   . pantoprazole (PROTONIX) EC tablet 40 mg    Sig:   . PARoxetine (PAXIL) tablet 30 mg    Sig:   . rosuvastatin (CRESTOR) tablet 20 mg    Sig:   . zolpidem (AMBIEN) tablet 5 mg    Sig:      Adaly Puder Camprubi-Soms, PA-C 09/20/15 1728  Paton Crum Camprubi-Soms,  PA-C 09/20/15 1730  Harvel Quale, MD 09/23/15 1718

## 2015-09-20 NOTE — ED Notes (Addendum)
Medications in red overdue since 1730 addressed with day shift nurse(Dawnly, RN). Nurses reported "I don't know what has been given" and chose not to address medications after being asked to by this Probation officer.
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
## 2015-09-20 NOTE — ED Notes (Signed)
Spoke with PA regarding need for sitter. Reports no need at this time. Pending TTS.

## 2015-09-20 NOTE — ED Notes (Signed)
Pt requesting help with detox from drugs. Was at [facility] this am for same. Also reports depression, wants inpatient treatment. Denies SI/HI.
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
## 2015-09-20 NOTE — ED Notes (Signed)
Dr. Rex Kras at bedside to talk with patient security outside the room. Pt got dressed and gathered his things. RN ambulated pt to discharge. Asked to wait for discharge papers, pt had papers in his hand and left.

## 2015-09-20 NOTE — ED Notes (Signed)
Pt to ED from home by EMS c/o chest tightness onset on Saturday. Reports increased stress, has been using cocaine and ETOH, last use on Saturday. EMS gave 324mg  ASA and nitro x 1 with mild improvement

## 2015-09-20 NOTE — ED Notes (Addendum)
To calling out for RN to come to the room. RN to bedside, requesting the phone # for [facility]. Again reminded the PA and MD will be coming to talk with him and to be patient.

## 2015-09-20 NOTE — ED Notes (Signed)
Lunch tray at bedside. ?

## 2015-09-20 NOTE — ED Notes (Signed)
TTS at bedside. 

## 2015-09-20 NOTE — ED Notes (Signed)
Pt denies SI/HI, does reports depression. Was recently seen at [facility] on Friday, went to work on Saturday night. After work, pt felt lonely and bought cocaine.

## 2015-09-20 NOTE — ED Notes (Signed)
Social work in to provide pt resources for discharge, pt became angry and did not accept the information. RN in to talk with patient, he is expressing feelings of not wanting to be discharged saying he doesn't have anywhere to go. Pt continues to report feelings of depression and needing "quick" treatment. PA made aware. Reports the plan is for PA and MD to talk with patient about disposition.

## 2015-09-20 NOTE — ED Notes (Addendum)
Patient d/c'd from monitor, continuous pulse oximetry and blood pressure cuff; Sarah, RN d/c'd patient's IV; patient is now waiting on [place], [place] to speak with him

## 2015-09-20 NOTE — ED Notes (Signed)
SI with a plan to overdose. Patient denies HI and AVH at this time. Encouragement and support provided and safety maintain. Q 15 min safety checks remain in place.

## 2015-09-20 NOTE — ED Notes (Signed)
TTS in process 

## 2015-09-20 NOTE — Progress Notes (Signed)
CSW engaged with Patient at his bedside. CSW familiar with Patient from previous ED visits and [facility] admissions. CSW re-introduced self and role of CSW. CSW inquired about Patient's outpatient mental health provider. Patient stated that he could not think of the name. CSW provided Patient with information for [facility] at the request of [facility]. Per [facility], Patient has an appointment there on next week. CSW also provided Patient with substance abuse resources including meeting dates/times/locations for NA/AA meetings and NA/AA contact information. Patient reported to CSW that he is unstable and needs inpatient treatment for worsening depression and substance abuse problems. CSW explained to Patient that he was just discharged from [facility] on 09/17/15 and has been cleared by psych on today for discharge home. CSW inquired about the coping skills Patient developed during his most recent inpatient psychiatric stay. CSW also inquired about medication compliance. CSW explained that part of obtaining stability is to take his medications as prescribed. Patient proceeded to state that "medications don't help me". Patient reports that he spent over $400 in drugs since being discharged from [facility] on 09/17/15. CSW discussed with Patient that he chose to spend his money on drugs as opposed to utilizing it for temporary housing and that part of his depression stems from the choices that he chooses to make. CSW inquired about what Patient is doing for himself to help himself get better at which time Patient got upset and asked CSW to leave. CSW left resources in Patient's room. No further questions or concerns reported at this time. CSW signing off. Please contact if new need(s) arise.          Emiliano Dyer, LCSW [facility] ED/20M Clinical Social Worker [phone]

## 2015-09-20 NOTE — ED Notes (Signed)
Noted charted at 19:00:44 under Almon Hercules ,RN was actually charted by writer Cyndia Bent. Aundra Dubin, Therapist, sports.

## 2015-09-21 ENCOUNTER — Encounter (HOSPITAL_COMMUNITY): Payer: Self-pay

## 2015-09-21 ENCOUNTER — Inpatient Hospital Stay (HOSPITAL_COMMUNITY)
Admission: AD | Admit: 2015-09-21 | Discharge: 2015-10-04 | DRG: 897 | Disposition: A | Discharge status: Other Acute Inpatient Hospital | Payer: Medicare Other | Source: Intra-hospital | Attending: Psychiatry | Admitting: Psychiatry

## 2015-09-21 DIAGNOSIS — F1424 Cocaine dependence with cocaine-induced mood disorder: Principal | ICD-10-CM | POA: Diagnosis present

## 2015-09-21 DIAGNOSIS — I10 Essential (primary) hypertension: Secondary | ICD-10-CM | POA: Diagnosis present

## 2015-09-21 DIAGNOSIS — R45851 Suicidal ideations: Secondary | ICD-10-CM | POA: Diagnosis present

## 2015-09-21 DIAGNOSIS — Z590 Homelessness: Secondary | ICD-10-CM

## 2015-09-21 DIAGNOSIS — F1490 Cocaine use, unspecified, uncomplicated: Secondary | ICD-10-CM | POA: Insufficient documentation

## 2015-09-21 DIAGNOSIS — Z21 Asymptomatic human immunodeficiency virus [HIV] infection status: Secondary | ICD-10-CM | POA: Diagnosis present

## 2015-09-21 DIAGNOSIS — F332 Major depressive disorder, recurrent severe without psychotic features: Secondary | ICD-10-CM | POA: Diagnosis present

## 2015-09-21 DIAGNOSIS — F1420 Cocaine dependence, uncomplicated: Secondary | ICD-10-CM | POA: Diagnosis not present

## 2015-09-21 DIAGNOSIS — F1220 Cannabis dependence, uncomplicated: Secondary | ICD-10-CM | POA: Diagnosis present

## 2015-09-21 DIAGNOSIS — F12229 Cannabis dependence with intoxication, unspecified: Secondary | ICD-10-CM | POA: Diagnosis present

## 2015-09-21 DIAGNOSIS — F1410 Cocaine abuse, uncomplicated: Secondary | ICD-10-CM

## 2015-09-21 DIAGNOSIS — F1994 Other psychoactive substance use, unspecified with psychoactive substance-induced mood disorder: Secondary | ICD-10-CM | POA: Diagnosis present

## 2015-09-21 DIAGNOSIS — F329 Major depressive disorder, single episode, unspecified: Secondary | ICD-10-CM | POA: Diagnosis not present

## 2015-09-21 DIAGNOSIS — Z9119 Patient's noncompliance with other medical treatment and regimen: Secondary | ICD-10-CM | POA: Diagnosis not present

## 2015-09-21 DIAGNOSIS — K219 Gastro-esophageal reflux disease without esophagitis: Secondary | ICD-10-CM | POA: Diagnosis present

## 2015-09-21 DIAGNOSIS — F17210 Nicotine dependence, cigarettes, uncomplicated: Secondary | ICD-10-CM | POA: Diagnosis present

## 2015-09-21 MED ORDER — ALUM & MAG HYDROXIDE-SIMETH 200-200-20 MG/5ML PO SUSP: 30.0000 mL | ORAL | Status: DC | PRN

## 2015-09-21 MED ORDER — EFAVIRENZ-EMTRICITAB-TENOFOVIR 600-200-300 MG PO TABS
1.0000 | ORAL_TABLET | Freq: Every day | ORAL | Status: DC
  Administered 2015-09-21 – 2015-10-03 (×14): 1 via ORAL
  Filled 2015-09-21 (×17): qty 1

## 2015-09-21 MED ORDER — TRAZODONE HCL 100 MG PO TABS: 100.0000 mg | ORAL_TABLET | Freq: Every day | ORAL | Status: DC

## 2015-09-21 MED ORDER — ROSUVASTATIN CALCIUM 20 MG PO TABS
20.0000 mg | ORAL_TABLET | Freq: Every day | ORAL | Status: DC
  Administered 2015-09-22 – 2015-10-04 (×13): 20 mg via ORAL
  Filled 2015-09-21 (×14): qty 1

## 2015-09-21 MED ORDER — CARVEDILOL 25 MG PO TABS
25.0000 mg | ORAL_TABLET | Freq: Two times a day (BID) | ORAL | Status: DC
  Administered 2015-09-22 – 2015-10-04 (×25): 25 mg via ORAL
  Filled 2015-09-21 (×27): qty 1

## 2015-09-21 MED ORDER — NICOTINE POLACRILEX 2 MG MT GUM
2.0000 mg | CHEWING_GUM | OROMUCOSAL | Status: DC | PRN
  Filled 2015-09-21: qty 30
  Filled 2015-09-21: qty 1

## 2015-09-21 MED ORDER — PANTOPRAZOLE SODIUM 40 MG PO TBEC
40.0000 mg | DELAYED_RELEASE_TABLET | Freq: Every day | ORAL | Status: DC
  Administered 2015-09-22 – 2015-10-04 (×13): 40 mg via ORAL
  Filled 2015-09-21 (×15): qty 1

## 2015-09-21 MED ORDER — PAROXETINE HCL 30 MG PO TABS
30.0000 mg | ORAL_TABLET | Freq: Every day | ORAL | Status: DC
  Administered 2015-09-22 – 2015-09-25 (×4): 30 mg via ORAL
  Filled 2015-09-21 (×6): qty 1

## 2015-09-21 MED ORDER — POTASSIUM CHLORIDE CRYS ER 20 MEQ PO TBCR
40.0000 meq | EXTENDED_RELEASE_TABLET | Freq: Once | ORAL | Status: AC
  Administered 2015-09-21: 40 meq via ORAL
  Filled 2015-09-21: qty 2

## 2015-09-21 MED ORDER — AMLODIPINE BESYLATE 10 MG PO TABS
10.0000 mg | ORAL_TABLET | Freq: Every day | ORAL | Status: DC
  Administered 2015-09-22 – 2015-10-04 (×13): 10 mg via ORAL
  Filled 2015-09-21 (×8): qty 1
  Filled 2015-09-21: qty 2
  Filled 2015-09-21 (×6): qty 1

## 2015-09-21 MED ORDER — ASPIRIN EC 81 MG PO TBEC
81.0000 mg | DELAYED_RELEASE_TABLET | Freq: Every day | ORAL | Status: DC
  Administered 2015-09-22 – 2015-10-04 (×13): 81 mg via ORAL
  Filled 2015-09-21 (×14): qty 1

## 2015-09-21 MED ORDER — FLUOXETINE HCL 20 MG PO CAPS: 20.0000 mg | ORAL_CAPSULE | Freq: Every day | ORAL | Status: DC

## 2015-09-21 MED ORDER — MAGNESIUM HYDROXIDE 400 MG/5ML PO SUSP: 30.0000 mL | Freq: Every day | ORAL | Status: DC | PRN

## 2015-09-21 MED ORDER — ACETAMINOPHEN 325 MG PO TABS
650.0000 mg | ORAL_TABLET | Freq: Four times a day (QID) | ORAL | Status: DC | PRN
  Administered 2015-09-22 – 2015-10-02 (×6): 650 mg via ORAL
  Filled 2015-09-21 (×6): qty 2

## 2015-09-21 MED ORDER — LISINOPRIL 10 MG PO TABS
10.0000 mg | ORAL_TABLET | Freq: Every day | ORAL | Status: DC
  Administered 2015-09-22 – 2015-10-04 (×13): 10 mg via ORAL
  Filled 2015-09-21 (×14): qty 1

## 2015-09-21 MED ORDER — ZOLPIDEM TARTRATE 5 MG PO TABS
5.0000 mg | ORAL_TABLET | Freq: Every evening | ORAL | Status: DC | PRN
  Administered 2015-09-21 – 2015-09-24 (×4): 5 mg via ORAL
  Filled 2015-09-21 (×4): qty 1

## 2015-09-21 NOTE — Progress Notes (Signed)
[place] is a 55 y.o. male being admitted voluntarily to [facility] from [facility].  He came to the ED because of chest pain and depression. Pt indicates that he has worsening depression and problems with addiction. Pt also identifies a major stressor as being homeless. Pt denies SI, HI, AVH.  Pt was just released from [facility] with similar concerns on 09/17/15, but admits that he has not f/u with taking his medications upon d/c. He was not able to give a reason as to why he had not taken his medications. Pt acknowledges that he has a therapy appt with [facility] next week and indicates that he needs to talk to someone about his addiction and depression. Pt indicates that he is interested in getting information about NA and AA and continually states that he needs help due to not being able to "bounce back" as he has in the past. He is diagnosed with Cocaine Induced depressive d/o.  He denies current SI and states that he would contract for safety on the unit.  He stated "I just want to have somewhere to live and get into a long term treatment program.  Admission paperwork completed and signed.  Belongings searched and secured in locker # 16.  Skin assessment completed and no skin issues noted.  Q 15 minute checks initiated for safety.  We will monitor the progress towards his goals.

## 2015-09-21 NOTE — Progress Notes (Addendum)
Patient ID: Nathan Santana, male   DOB: 1960/03/23, 55 y.o.   MRN: [id number] Per State regulations 482.30 this chart was reviewed for medical necessity with respect to the patient's admission/duration of stay.    Next review date: 09/24/15  Debarah Crape, BSN, RN-BC  Case Manager

## 2015-09-21 NOTE — ED Notes (Signed)
Pt behavior cooperative, sad affect. Pt reports "nothing is right" Pt expressing hopelessness and increase in depression over the past couple of months. Pt endorsing passive SI. Denies HI. Denies AVH. Special checks q 15 mins in place for safety. Video monitoring in place.

## 2015-09-21 NOTE — ED Notes (Signed)
[facility] transport at facility to transfer pt to [facility] Adult unit per MD order. Pt signed for personal belongings and property given to [facility] transport for transfer. Pt signed e-signature. Ambulatory off unit with [facility].

## 2015-09-21 NOTE — ED Notes (Signed)
Attempted to call report to adult unit.

## 2015-09-21 NOTE — BH Assessment (Signed)
[facility] Assessment Progress Note  Per Corena Pilgrim, MD, this pt requires psychiatric hospitalization at this time.  Ria Comment, RN, [facility] has assigned pt to [facility] Rm 303-[facility].  Pt has signed Voluntary Admission and Consent for Treatment, as well as Consent to Release Information, and signed forms have been faxed to [facility].  Pt's nurse has been notified, and agrees to send original paperwork along with pt via [facility], and to call report to [phone].  Jalene Mullet, [place] Triage Specialist [phone]

## 2015-09-21 NOTE — Tx Team (Signed)
Initial Interdisciplinary Treatment Plan   PATIENT STRESSORS: Financial difficulties Medication change or noncompliance Substance abuse   PATIENT STRENGTHS: Curator fund of knowledge Motivation for treatment/growth   PROBLEM LIST: Problem List/Patient Goals Date to be addressed Date deferred Reason deferred Estimated date of resolution  Depression 09/21/15     Anxiety 09/21/15     Substance abuse 09/21/15     Suicidal ideation 09/21/15     "Long term program" 09/21/15     "A place to stay" 09/21/15                        DISCHARGE CRITERIA:  Improved stabilization in mood, thinking, and/or behavior Verbal commitment to aftercare and medication compliance  PRELIMINARY DISCHARGE PLAN: Outpatient therapy "I want an inpatient long term treatment  PATIENT/FAMIILY INVOLVEMENT: This treatment plan has been presented to and reviewed with the patient, Nathan Santana.  The patient and family have been given the opportunity to ask questions and make suggestions.  [place] 09/21/2015, 8:05 PM
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
## 2015-09-22 ENCOUNTER — Encounter (HOSPITAL_COMMUNITY): Payer: Self-pay | Admitting: Psychiatry

## 2015-09-22 DIAGNOSIS — F1220 Cannabis dependence, uncomplicated: Secondary | ICD-10-CM | POA: Diagnosis present

## 2015-09-22 DIAGNOSIS — F1420 Cocaine dependence, uncomplicated: Secondary | ICD-10-CM

## 2015-09-22 DIAGNOSIS — F332 Major depressive disorder, recurrent severe without psychotic features: Secondary | ICD-10-CM

## 2015-09-22 MED ORDER — LORAZEPAM 1 MG PO TABS
1.0000 mg | ORAL_TABLET | ORAL | Status: DC | PRN
  Administered 2015-09-25: 1 mg via ORAL
  Filled 2015-09-22: qty 1

## 2015-09-22 MED ORDER — LORAZEPAM 2 MG/ML IJ SOLN: 1.0000 mg | INTRAMUSCULAR | Status: DC | PRN

## 2015-09-22 NOTE — Progress Notes (Signed)
Recreation Therapy Notes  Date: 07.05.2017 Time: 9:30am Location: [address] Dayroom   Group Topic: Stress Management  Goal Area(s) Addresses:  Patient will actively participate in stress management techniques presented during session.   Behavioral Response: Did not attend.   Laureen Ochs Smrithi Pigford, LRT/CTRS        Rayya Yagi L 09/22/2015 10:06 AM

## 2015-09-22 NOTE — Tx Team (Signed)
Interdisciplinary Treatment Plan Update (Adult)  Date:  09/22/2015  Time Reviewed:  8:19 AM   Progress in Treatment: Attending groups: No. New to unit. Continuing to assess.  Participating in groups:  No. Taking medication as prescribed:  Yes. Tolerating medication:  Yes. Family/Significant othe contact made:  SPE required for this pt.  Patient understands diagnosis:  Yes. and As evidenced by:  seeking treatment for SI, depression/mood instability, cocaine abuse, and for medication stabilization. Discussing patient identified problems/goals with staff:  Yes. Medical problems stabilized or resolved:  Yes. Denies suicidal/homicidal ideation: Yes. Issues/concerns per patient self-inventory:  Other:  Discharge Plan or Barriers: CSW assessing. Pt was last admitted to [facility] 09/07/15 and 08/17/15 for similar issues and was referred to [place] for medication management with [name] (NP) and therapy with [name] (Counselor).   Reason for Continuation of Hospitalization: Depression Medication stabilization Withdrawal symptoms  Comments:  Nathan Santana is a 55 y.o. male presenting for depression and substance abuse.  At that time, pt denied SI. Pt was given resources from [place] and advised to keep his appt with [facility] next [facility]. Pt now presented at [facility] for the same concerns. Pt wants IP hospitalization. Pt denied SI to the triage RN, but told the PA that he was suicidal with a plan to OD. Writer spoke to pt about this and he indicated that he thought that Probation officer understood that he was suicidal b/c he was depressed. Diagnosis: Cocaine induced depressive disorder; MDD, recurrent episode, severe  Estimated length of stay:  2-4 days   New goal(s): to develop effective aftercare plan.   Additional Comments:  Patient and CSW reviewed pt's identified goals and treatment plan. Patient verbalized understanding and agreed to treatment plan. CSW reviewed [facility] "Discharge  Process and Patient Involvement" Form. Pt verbalized understanding of information provided and signed form.    Review of initial/current patient goals per problem list:  1. Goal(s): Patient will participate in aftercare plan  Met: No.   Target date: at discharge  As evidenced by: Patient will participate within aftercare plan AEB aftercare provider and housing plan at discharge being identified.  09/22/15: CSW assessing for appropriate referrals. During admission last month, pt was referred to [place] for o/p mental health needs.   2. Goal (s): Patient will exhibit decreased depressive symptoms and suicidal ideations.  Met: No.    Target date: at discharge  As evidenced by: Patient will utilize self rating of depression at 3 or below and demonstrate decreased signs of depression or be deemed stable for discharge by MD.  09/22/15: Pt rates depression as high. Reports SI "on and off" but currently denies. Pt denies HI/AVH.   3. Goal(s): Patient will demonstrate decreased signs of withdrawal due to substance abuse  Met:No.   Target date:at discharge   As evidenced by: Patient will produce a CIWA/COWS score of 0, have stable vitals signs, and no symptoms of withdrawal.  7/5: Pt reports minimal withdrawals with COWS of 1 and high BP.   Attendees: Patient:   09/22/2015 8:19 AM   Family:   09/22/2015 8:19 AM   Physician:  Dr. Shea Evans MD 09/22/2015 8:19 AM   Nursing:   Merlene Morse RN 09/22/2015 8:19 AM   Clinical Social Worker: Maxie Better, LCSW 09/22/2015 8:19 AM   Clinical Social Worker: Erasmo Downer Drinkard LCSW 09/22/2015 8:19 AM   Other: [name] NP 09/22/2015 8:19 AM   Other:   09/22/2015 8:19 AM   Other:   09/22/2015 8:19  AM   Other:  09/22/2015 8:19 AM   Other:  09/22/2015 8:19 AM   Other:  09/22/2015 8:19 AM    09/22/2015 8:19 AM    09/22/2015 8:19 AM    09/22/2015 8:19 AM    09/22/2015 8:19 AM    Scribe for Treatment Team:   Maxie Better, LCSW 09/22/2015 8:19 AM
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
## 2015-09-22 NOTE — H&P (Signed)
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 Cocaine use disorder, severe, dependence ([place]) [F14.20] 09/22/2015  . Substance or medication-induced depressive disorder with onset during intoxication ([place]) [F19.94] 09/22/2015  . Cannabis use disorder, moderate, dependence ([place]) [F12.20] 09/22/2015  . MDD (major depressive disorder), recurrent severe, without psychosis ([place]) [F33.2] 09/21/2015  . Hyperlipidemia LDL goal <130 [E78.5] 03/01/2015  . Orchalgia [N50.819]   . Testicular pain, right [N50.811]   . HIV (human immunodeficiency virus infection) ([place]) [Z21] 02/05/2015  . CAD (coronary artery disease) [I25.10] 02/05/2015  . Chest pain [R07.9] 02/05/2015  . Suicidal ideations [R45.851] 02/05/2015  . Hypertension [I10] 02/05/2015  . Anxiety [F41.9]   . Pain in the chest [R07.9]   . Depression [F32.9]   . Suicidal ideation [R45.851]    History of Present Illness:  Patient is a 55 year old AA male, known to our service from prior psychiatric admissions ,has a hx of depression as well as cocaine, cannabis abuse , who presented with worsening depresison and SI .  Per initial notes in EHR " Nathan Santana is a 55 y.o. male patient admitted with reports of suicidal ideation along with cocaine and THC abuse. Pt was recently discharged from [facility] and then returned to [facility] with a plan to OD. Pt known to our treatment team from other consults. Pt's primary triggers are losing his house in August of 2016 along with family strain. Pt seen and chart reviewed. Pt is alert/oriented x4, calm, cooperative, and appropriate to situation. Pt denies homicidal ideation and psychosis and does not appear to  be responding to internal stimuli. "  Patient seen and chart reviewed today .Discussed patient with treatment team. Pt today is seen as depressed, withdrawn , dysphoric and anxious. Pt with poor eye contact, stays in bed withdrawn and isolative . Pt reports he feels unstable and he needs help. Pt reports sadness, hopelessness , restlessness, SI . Pt reports he relapsed on cocaine - spend 600 $ in 1 and 1/2 day . Pt reports he wants to get help and is very motivated to do so. Pt also reports abusing alcohol - 2-3 40 oz daily since the past 2 months. Pt denies any withdrawal sx.  Pt was recently admitted and discharged on 09/17/15 from [facility]. Pt reports his sx never went away and his depression is worse now. Pt reports he attempted to OD on cocaine two days ago. Pt also reports he did not fill his prescriptions on discharge.   Associated Signs/Symptoms: Depression Symptoms:  depressed mood, anhedonia, recurrent thoughts of death, loss of energy/fatigue,   (Hypo) Manic Symptoms:  does not endorse  Anxiety Symptoms:  non specific worries, anxiety Psychotic Symptoms:  does not endorse and does not present internally preoccupied or bizarre PTSD Symptoms: Does not endorse  Total Time spent with patient: 45 minutes  Past Psychiatric History:  Patient has had prior psychiatric admissions, most recently discharged on 09/17/15 .Pt is noncompliant on medications. Pt reports he tried to OD on cocaine couple of days ago.  Is the patient at risk to self? Yes.    Has the patient been a risk to self in the past 6 months? Yes.    Has the patient been a  risk to self within the distant past? Yes.    Is the patient a risk to others? No.  Has the patient been a risk to others in the past 6 months? No.  Has the patient been a risk to others within the distant past? No.   Prior Inpatient Therapy:  as above  Prior Outpatient Therapy:  as above   Alcohol Screening: 1. How often do you have a drink containing  alcohol?: 2 to 3 times a week 2. How many drinks containing alcohol do you have on a typical day when you are drinking?: 5 or 6 3. How often do you have six or more drinks on one occasion?: Less than monthly Preliminary Score: 3 4. How often during the last year have you found that you were not able to stop drinking once you had started?: Weekly 5. How often during the last year have you failed to do what was normally expected from you becasue of drinking?: Never 6. How often during the last year have you needed a first drink in the morning to get yourself going after a heavy drinking session?: Never 7. How often during the last year have you had a feeling of guilt of remorse after drinking?: Never 8. How often during the last year have you been unable to remember what happened the night before because you had been drinking?: Never 9. Have you or someone else been injured as a result of your drinking?: No 10. Has a relative or friend or a doctor or another health worker been concerned about your drinking or suggested you cut down?: No Alcohol Use Disorder Identification Test Final Score (AUDIT): 9 Brief Intervention: Patient declined brief intervention Substance Abuse History in the last 12 months:   History of alcohol and cocaine abuse, see above for details.  Consequences of Substance Abuse:several admissions to IP unit Previous Psychotropic Medications: has been on Paxil. Psychological Evaluations: No  Past Medical History:   Past Medical History  Diagnosis Date  . HIV infection ([place])   . Cancer ([place])   . Hypertension   . Angina pectoris ([place])   . Scrotal cyst   . Anxiety   . Depression   . Hypercholesteremia   . Myocardial infarct, old     Past Surgical History  Procedure Laterality Date  . Scrotal turmor removed     Family History: Per EHR ' parents deceased, father from MI, mother was murdered ". Family History  Problem Relation Age of Onset  . Hypertension Other   . CAD  Sister    Family Psychiatric  History: does not endorse mental illness if family  Tobacco Screening: does not smoke cigarettes  Social History: currently homeless, has an adult daughter, currently in college. Does not endorse legal issues  History  Alcohol Use No    Comment: one beer weekly     History  Drug Use  . Yes  . Special: Marijuana, Cocaine    Comment: Cocaine    Additional Social History:      Pain Medications: see PTA list Prescriptions: see PTA list Over the Counter: see PTA list Longest period of sobriety (when/how long): unknown Negative Consequences of Use: Financial, Legal, Personal relationships, Work / School Withdrawal Symptoms: Other (Comment) (Depression and anxiety) Name of Substance 1: Alcohol 1 - Age of First Use: 13 1 - Amount (size/oz): "a couple of beers" 1 - Frequency: every other day 1 - Duration: years 1 - Last Use / Amount: 09/20/15 Name of Substance  2: Marijuana 2 - Age of First Use: 12 or 13 2 - Amount (size/oz): "a dime bag" 2 - Frequency: daily 2 - Duration: ongoing; cut back since Nov 2016 2 - Last Use / Amount: early part of this morning (09/20/2015) Name of Substance 3: Cocaine 3 - Age of First Use: 26s 3 - Amount (size/oz): binges 3 - Frequency: pt sts he binges every few weeks 3 - Duration: ongoing 3 - Last Use / Amount: early part of this morning (09/20/2015) Name of Substance 4: Nicotine/Cigarettes 4 - Age of First Use: 64s 4 - Amount (size/oz): 1 or 2 4 - Frequency: daily 4 - Duration: quit for a few years; started again in Nov 2016 4 - Last Use / Amount: today            Allergies:  No Known Allergies Lab Results:  No results found for this or any previous visit (from the past 48 hour(s)).  Blood Alcohol level:  Lab Results  Component Value Date   ETH <5 09/20/2015   ETH <5 [id number]    Metabolic Disorder Labs:  Lab Results  Component Value Date   HGBA1C 5.3 02/06/2015   MPG 105 02/06/2015   MPG 105  01/30/2015   No results found for: PROLACTIN Lab Results  Component Value Date   CHOL 156 03/01/2015   TRIG 60 03/01/2015   HDL 51 03/01/2015   CHOLHDL 3.1 03/01/2015   VLDL 12 03/01/2015   LDLCALC 93 03/01/2015   LDLCALC 95 02/06/2015    Current Medications: Current Facility-Administered Medications  Medication Dose Route Frequency Provider Last Rate Last Dose  . acetaminophen (TYLENOL) tablet 650 mg  650 mg Oral Q6H PRN Benjamine Mola, FNP   650 mg at 09/22/15 0840  . alum & mag hydroxide-simeth (MAALOX/MYLANTA) 200-200-20 MG/5ML suspension 30 mL  30 mL Oral Q4H PRN Benjamine Mola, FNP      . amLODipine (NORVASC) tablet 10 mg  10 mg Oral Daily Benjamine Mola, FNP   10 mg at 09/22/15 [id number]  . aspirin EC tablet 81 mg  81 mg Oral Daily Benjamine Mola, FNP   81 mg at 09/22/15 [id number]  . carvedilol (COREG) tablet 25 mg  25 mg Oral BID WC Benjamine Mola, FNP   25 mg at 09/22/15 [id number]  . efavirenz-emtricitabine-tenofovir (ATRIPLA) 600-200-300 MG per tablet 1 tablet  1 tablet Oral QHS Benjamine Mola, FNP   1 tablet at 09/21/15 2208  . lisinopril (PRINIVIL,ZESTRIL) tablet 10 mg  10 mg Oral Daily Benjamine Mola, FNP   10 mg at 09/22/15 0839  . magnesium hydroxide (MILK OF MAGNESIA) suspension 30 mL  30 mL Oral Daily PRN Benjamine Mola, FNP      . nicotine polacrilex (NICORETTE) gum 2 mg  2 mg Oral PRN Benjamine Mola, FNP      . pantoprazole (PROTONIX) EC tablet 40 mg  40 mg Oral Daily Benjamine Mola, FNP   40 mg at 09/22/15 0839  . PARoxetine (PAXIL) tablet 30 mg  30 mg Oral Daily Benjamine Mola, FNP   30 mg at 09/22/15 [id number]  . rosuvastatin (CRESTOR) tablet 20 mg  20 mg Oral Daily Benjamine Mola, FNP   20 mg at 09/22/15 [id number]  . zolpidem (AMBIEN) tablet 5 mg  5 mg Oral QHS PRN Benjamine Mola, FNP   5 mg at 09/21/15 2208   PTA Medications: Prescriptions prior to admission  Medication Sig Dispense Refill Last  Dose  . amLODipine (NORVASC) 10 MG tablet Take 1 tablet (10 mg total) by mouth daily. 30  tablet 0 09/17/2015 at unknown time  . aspirin EC 81 MG tablet Take 1 tablet (81 mg total) by mouth daily. 30 tablet 2 09/17/2015 at unknown time  . carvedilol (COREG) 25 MG tablet Take 1 tablet (25 mg total) by mouth 2 (two) times daily with a meal. 60 tablet 0 09/17/2015 at unknown time  . efavirenz-emtricitabine-tenofovir (ATRIPLA) 600-200-300 MG tablet Take 1 tablet by mouth at bedtime. 30 tablet 0 09/17/2015 at unknown time  . lisinopril (PRINIVIL,ZESTRIL) 10 MG tablet Take 1 tablet (10 mg total) by mouth daily. 30 tablet 0 09/17/2015 at unknown time  . nicotine polacrilex (NICORETTE) 2 MG gum Take 1 each (2 mg total) by mouth as needed for smoking cessation. 100 tablet 0   . nitroGLYCERIN (NITROSTAT) 0.4 MG SL tablet Place 1 tablet (0.4 mg total) under the tongue every 5 (five) minutes as needed for chest pain. 30 tablet 0 uknown  . pantoprazole (PROTONIX) 40 MG tablet Take 1 tablet (40 mg total) by mouth daily. 30 tablet 0 09/17/2015 at unknown time  . PARoxetine (PAXIL) 30 MG tablet Take 1 tablet (30 mg total) by mouth daily. 30 tablet 0 09/17/2015 at unknown time  . rosuvastatin (CRESTOR) 20 MG tablet Take 1 tablet (20 mg total) by mouth daily. 30 tablet 0 09/17/2015 at unknown time  . zolpidem (AMBIEN) 5 MG tablet Take 1 tablet (5 mg total) by mouth at bedtime as needed for sleep. 30 tablet 0 09/17/2015 at unknown time    Musculoskeletal: Strength & Muscle Tone: within normal limits Gait & Station: normal Patient leans: N/A  Psychiatric Specialty Exam: Physical Exam  Nursing note and vitals reviewed. Constitutional:  I CONCUR WITH PE DONE IN ED    Review of Systems  Constitutional: Negative.   HENT: Negative.   Eyes: Negative.   Respiratory: Negative.   Cardiovascular: Negative.   Gastrointestinal: Negative.   Genitourinary: Negative.   Musculoskeletal: Negative.   Skin: Negative.   Neurological: Negative for seizures.  Endo/Heme/Allergies: Negative.   Psychiatric/Behavioral:  Positive for depression, suicidal ideas and substance abuse. The patient is nervous/anxious.   All other systems reviewed and are negative.   Blood pressure 145/96, pulse 66, temperature 98.4 F (36.9 C), temperature source Oral, resp. rate 18, height 5\' 9"  (1.753 m), weight 83.689 kg (184 lb 8 oz), SpO2 100 %.Body mass index is 27.23 kg/(m^2).  General Appearance: Fairly Groomed  Eye Contact:  Good  Speech:  Normal Rate  Volume:  Normal  Mood:  Depressed and Dysphoric  Affect:  Depressed  Thought Process:  Linear  Orientation:  Full (Time, Place, and Person)  Thought Content:  denies hallucinations, no delusions, not internally preoccupied   Suicidal Thoughts:  Yes. Recent OD on cocaine , contracts for safety on the unit  Homicidal Thoughts:  No  Memory:  recent and remote grossly intact , immediate -fair  Judgement:  Impaired  Insight:  Fair  Psychomotor Activity:  Normal  Concentration:  Concentration: Good and Attention Span: Good  Recall:  Good  Fund of Knowledge:  Good  Language:  Good  Akathisia:  Negative  Handed:  Right  AIMS (if indicated):     Assets:  Desire for Improvement Resilience  ADL's:  Intact  Cognition:  WNL  Sleep:  Number of Hours: 6.25       Treatment Plan Summary:Patient is a 55 year old AA male,  known to our service from prior psychiatric admissions ,has a hx of depression as well as cocaine, cannabis abuse , who presented with worsening depresison and SI .  Daily contact with patient to assess and evaluate symptoms and progress in treatment, Medication management, Plan inpatient  and medications as below    Patient will benefit from inpatient treatment and stabilization.  Estimated length of stay is 5-7 days.  Reviewed past medical records,treatment plan.  Will restart Paxil for depressive sx- home dose, could titrate higher as needed. Continue Ambien 5 mg po qhs prn for sleep. Restart all home medications where indicated. Will continue to  monitor vitals ,medication compliance and treatment side effects while patient is here.  Will monitor for medical issues as well as call consult as needed.  Reviewed labs K+ low  Replaced in ED- will repeat BMP , UDS- POS for cocaine, THC. CSW will start working on disposition.Pt is motivated to go to a substance abuse treatment program.  Patient to participate in therapeutic milieu .       Observation Level/Precautions:  15 minute checks  Laboratory:  As needed   Psychotherapy:  Milieu, support     Consultations:  As needed   Discharge Concerns: stability/safety  Estimated LOS:- 5 days-  Other:     I certify that inpatient services furnished can reasonably be expected to improve the patient's condition.    Ursula Alert, MD 7/5/201712:07 PM

## 2015-09-22 NOTE — Plan of Care (Signed)
Problem: Education: Goal: Utilization of techniques to improve thought processes will improve Outcome: Progressing Nurse discussed depression/coping skills with patient.    

## 2015-09-22 NOTE — Progress Notes (Signed)
D:  Patient's self inventory sheet, patient has poor sleep, sleep medication is not helpful.  Poor appetite, low energy level, unsure of concentration.  Rated depression, hopeless and anxiety  #10.  Denied withdrawals.  SI, contracts for safety, no plan.  Pain back, no pain medication.  Goal is "longer treatment".  Plans to ask questions.  No discharge plans. "Not enough treatment and no where to go." A:  Medications administered per MD orders.  Emotional support and encouragement given patient. R:  Denied HI.  Denied A/V hallucinations.  SI, no plan, contracts for safety.  Safety maintained with 15 minute checks. Patient stated he wants to discharge to a long term treatment facility.

## 2015-09-22 NOTE — BHH Suicide Risk Assessment (Signed)
[facility] Admission Suicide Risk Assessment   Nursing information obtained from:    Demographic factors:    Current Mental Status:    Loss Factors:    Historical Factors:    Risk Reduction Factors:     Total Time spent with patient: 30 minutes Principal Problem: Substance or medication-induced depressive disorder with onset during intoxication [facility]) Diagnosis:   Patient Active Problem List   Diagnosis Date Noted  . Cocaine use disorder, severe, dependence ([place]) [F14.20] 09/22/2015  . Substance or medication-induced depressive disorder with onset during intoxication ([place]) [F19.94] 09/22/2015  . Cannabis use disorder, moderate, dependence ([place]) [F12.20] 09/22/2015  . MDD (major depressive disorder), recurrent severe, without psychosis ([place]) [F33.2] 09/21/2015  . Hyperlipidemia LDL goal <130 [E78.5] 03/01/2015  . Orchalgia [N50.819]   . Testicular pain, right [N50.811]   . HIV (human immunodeficiency virus infection) ([place]) [Z21] 02/05/2015  . CAD (coronary artery disease) [I25.10] 02/05/2015  . Chest pain [R07.9] 02/05/2015  . Suicidal ideations [R45.851] 02/05/2015  . Hypertension [I10] 02/05/2015  . Anxiety [F41.9]   . Pain in the chest [R07.9]   . Depression [F32.9]   . Suicidal ideation [R45.851]    Subjective Data: Please see H&P.   Continued Clinical Symptoms:  Alcohol Use Disorder Identification Test Final Score (AUDIT): 9 The "Alcohol Use Disorders Identification Test", Guidelines for Use in Primary Care, Second Edition.  [employer] [facility]). Score between 0-7:  no or low risk or alcohol related problems. Score between 8-15:  moderate risk of alcohol related problems. Score between 16-19:  high risk of alcohol related problems. Score 20 or above:  warrants further diagnostic evaluation for alcohol dependence and treatment.   CLINICAL FACTORS:   Alcohol/Substance Abuse/Dependencies Previous Psychiatric Diagnoses and Treatments   Musculoskeletal: Strength &  Muscle Tone: within normal limits Gait & Station: normal Patient leans: N/A  Psychiatric Specialty Exam: Physical Exam  Nursing note and vitals reviewed. Constitutional:  I concur with PE done in ED.    Review of Systems  Psychiatric/Behavioral: Positive for depression, suicidal ideas and substance abuse. The patient is nervous/anxious.   All other systems reviewed and are negative.   Blood pressure 145/96, pulse 66, temperature 98.4 F (36.9 C), temperature source Oral, resp. rate 18, height 5\' 9"  (1.753 m), weight 83.689 kg (184 lb 8 oz), SpO2 100 %.Body mass index is 27.23 kg/(m^2).                      Please see H&P.                                     COGNITIVE FEATURES THAT CONTRIBUTE TO RISK:  Closed-mindedness, Polarized thinking and Thought constriction (tunnel vision)    SUICIDE RISK:   Severe:  Frequent, intense, and enduring suicidal ideation, specific plan, no subjective intent, but some objective markers of intent (i.e., choice of lethal method), the method is accessible, some limited preparatory behavior, evidence of impaired self-control, severe dysphoria/symptomatology, multiple risk factors present, and few if any protective factors, particularly a lack of social support.  PLAN OF CARE: Please see H&P.   I certify that inpatient services furnished can reasonably be expected to improve the patient's condition.   Othel Hoogendoorn, MD 09/22/2015, 11:50 AM

## 2015-09-22 NOTE — Progress Notes (Signed)
Pt has been in the dayroom watching TV with minimal interaction with peers.  He reports that his day has been "ok", but he is worried about getting into a long term treatment program that is at least 6 mons to a year long.  Pt says he has passive suicidal thoughts that come and go.  He denies HI/AVH.  He is pleasant and cooperative with staff.  He makes his needs known to staff and voices no needs or concerns at this time.  Support and encouragement offered.  Discharge plans are in process.  Safety maintained with q15 minute checks.

## 2015-09-22 NOTE — BHH Group Notes (Signed)
[facility] LCSW Group Therapy  09/22/2015 3:30 PM  Type of Therapy:  Group Therapy  Participation Level:  Did Not Attend-pt invited. Chose to rest in room.   Summary of Progress/Problems: Emotion Regulation: This group focused on both positive and negative emotion identification and allowed group members to process ways to identify feelings, regulate negative emotions, and find healthy ways to manage internal/external emotions. Group members were asked to reflect on a time when their reaction to an emotion led to a negative outcome and explored how alternative responses using emotion regulation would have benefited them. Group members were also asked to discuss a time when emotion regulation was utilized when a negative emotion was experienced.   Smart, Rielyn Krupinski LCSW 09/22/2015, 3:30 PM

## 2015-09-22 NOTE — Progress Notes (Signed)
Patient attended N/A group tonight.  

## 2015-09-23 LAB — BASIC METABOLIC PANEL
Anion gap: 4 — ABNORMAL LOW (ref 5–15)
BUN: 16 mg/dL (ref 6–20)
CHLORIDE: 106 mmol/L (ref 101–111)
CO2: 29 mmol/L (ref 22–32)
Calcium: 8.9 mg/dL (ref 8.9–10.3)
Creatinine, Ser: 0.93 mg/dL (ref 0.61–1.24)
GFR calc non Af Amer: >60 mL/min (ref >60)
Glucose, Bld: 89 mg/dL (ref 65–99)
POTASSIUM: 3.6 mmol/L (ref 3.5–5.1)
SODIUM: 139 mmol/L (ref 135–145)

## 2015-09-23 NOTE — BHH Group Notes (Signed)
[facility] LCSW Group Therapy  09/23/2015 2:32 PM  Type of Therapy:  Group Therapy  Participation Level:  Active  Participation Quality:  Attentive  Affect:  Appropriate  Cognitive:  Alert and Oriented  Insight:  Engaged  Engagement in Therapy:  Engaged  Modes of Intervention:  Confrontation, Discussion, Education, Exploration, Problem-solving, Rapport Building, Socialization and Support  Summary of Progress/Problems:  Finding Balance in Life. Today's group focused on defining balance in one's own words, identifying things that can knock one off balance, and exploring healthy ways to maintain balance in life. Group members were asked to provide an example of a time when they felt off balance, describe how they handled that situation,and process healthier ways to regain balance in the future. Group members were asked to share the most important tool for maintaining balance that they learned while at [facility] and how they plan to apply this method after discharge. Charleton was attentive and engaged during today's processing group. He shared that for him to find balance, "I have to leave my family behind and focus on me for once. I need to go to long term treatment somewhere far away." Kenna shared that his family is not supportive of him and at times, "are very judgement al people." Mckade shared that he spends money on others and puts others before him but needs to put himself first in order to stop his relapse cycle and subsequent hospitalizations. He was monopolizing at times but easily redirectable in group, appropriate, and pleasant.   Smart, Lucus Lambertson LCSW 09/23/2015, 2:32 PM

## 2015-09-23 NOTE — Progress Notes (Signed)
Patient ID: Nathan Santana, male   DOB: 16-Mar-1961, 55 y.o.   MRN: [id number]  DAR: Pt. Denies HI and A/V Hallucinations. He reports passive SI but is able to contract for safety. He denies a plan at this time. He reports sleep is fair, appetite is fair, and energy level is low. He rates depression, hopelessness, and anxiety 8/10. Patient does report pain in his lower back and received PRN Tylenol this morning. Support and encouragement provided to the patient. Patient is minimal but cooperative with Probation officer. He reports feeling remorse for relapsing, He states, "I did the shit again, but I can get back. I just need to change where I stay. 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 Q15 minute checks are maintained for safety.

## 2015-09-23 NOTE — Progress Notes (Signed)
Patient ID: Nathan Santana, male   DOB: 1961-01-28, 55 y.o.   MRN: [id number] Patient ID: Nathan Santana, male   DOB: October 06, 1960, 55 y.o.   MRN: [id number] Patient ID: Nathan Santana, male   DOB: 12/26/1960, 56 y.o.   MRN: [id number] [facility] MD Progress Note  09/23/2015 1:45 PM Nathan Santana  MRN:  [id number]   Subjective: Nathan Santana reports, "My depression worsened as soon as I left this hospital last Friday. I became suicidal, started using again. I relapsed on Saturday night, soon after leaving the hospital. Right now, I feel depressed & stupid. I feel like I don't have any where to go after discharge. I need a long term substance abuse treatment after discharge. I'm still feeling like I'm going to hurt myself".  Objective: Nathan Santana is seen & chart is reviewed. He is reporting that the symptoms of his depression has worsened. He adds that was why he relapsed soon after leaving the hospital after discharge last Friday. He is saying that he needs a long term substance abuse treatment after discharge this time. He says he has no where to go after discharge. He continues to endorse suicidal ideations, denies any intent or plans to hurt himself. He denies any HI, AVH. He is endorsing insomnia. Nathan Santana is reminded that he does have an order for Ambien 5 mg Q hs. He is visible on the unit & participating in the group milieu.  Principal Problem: Substance or medication-induced depressive disorder with onset during intoxication [facility])  Diagnosis:   Patient Active Problem List   Diagnosis Date Noted  . Cocaine use disorder, severe, dependence ([place]) [F14.20] 09/22/2015  . Substance or medication-induced depressive disorder with onset during intoxication ([place]) [F19.94] 09/22/2015  . Cannabis use disorder, moderate, dependence ([place]) [F12.20] 09/22/2015  . MDD (major depressive disorder), recurrent severe, without psychosis ([place]) [F33.2] 09/21/2015  . Hyperlipidemia LDL goal <130 [E78.5] 03/01/2015  .  Orchalgia [N50.819]   . Testicular pain, right [N50.811]   . HIV (human immunodeficiency virus infection) ([place]) [Z21] 02/05/2015  . CAD (coronary artery disease) [I25.10] 02/05/2015  . Chest pain [R07.9] 02/05/2015  . Suicidal ideations [R45.851] 02/05/2015  . Hypertension [I10] 02/05/2015  . Anxiety [F41.9]   . Pain in the chest [R07.9]   . Depression [F32.9]   . Suicidal ideation [R45.851]    Total Time spent with patient: 15 minutes  Past Medical History:  Past Medical History  Diagnosis Date  . HIV infection ([place])   . Cancer ([place])   . Hypertension   . Angina pectoris ([place])   . Scrotal cyst   . Anxiety   . Depression   . Hypercholesteremia   . Myocardial infarct, old     Past Surgical History  Procedure Laterality Date  . Scrotal turmor removed     Family History:  Family History  Problem Relation Age of Onset  . Hypertension Other   . CAD Sister     Social History:  History  Alcohol Use No    Comment: one beer weekly     History  Drug Use  . Yes  . Special: Marijuana, Cocaine    Comment: Cocaine    Social History   Social History  . Marital Status: Single    Spouse Name: N/A  . Number of Children: 4  . Years of Education: N/A   Social History Main Topics  . Smoking status: Current Some Day Smoker -- 0.50 packs/day    Types: Cigars, Cigarettes  . Smokeless tobacco: None  .  Alcohol Use: No     Comment: one beer weekly  . Drug Use: Yes    Special: Marijuana, Cocaine     Comment: Cocaine  . Sexual Activity: No   Other Topics Concern  . None   Social History Narrative   Additional Social History:   Sleep: Poor-(encouraged to ask for his Ambien prior to going to bed at night).  Appetite:  improved   Current Medications: Current Facility-Administered Medications  Medication Dose Route Frequency Provider Last Rate Last Dose  . acetaminophen (TYLENOL) tablet 650 mg  650 mg Oral Q6H PRN Benjamine Mola, FNP   650 mg at 09/23/15 0853  . alum  & mag hydroxide-simeth (MAALOX/MYLANTA) 200-200-20 MG/5ML suspension 30 mL  30 mL Oral Q4H PRN Benjamine Mola, FNP      . amLODipine (NORVASC) tablet 10 mg  10 mg Oral Daily Benjamine Mola, FNP   10 mg at 09/23/15 1638  . aspirin EC tablet 81 mg  81 mg Oral Daily Benjamine Mola, FNP   81 mg at 09/23/15 4665  . carvedilol (COREG) tablet 25 mg  25 mg Oral BID WC Benjamine Mola, FNP   25 mg at 09/23/15 9935  . efavirenz-emtricitabine-tenofovir (ATRIPLA) 600-200-300 MG per tablet 1 tablet  1 tablet Oral QHS Benjamine Mola, FNP   1 tablet at 09/22/15 2119  . lisinopril (PRINIVIL,ZESTRIL) tablet 10 mg  10 mg Oral Daily Benjamine Mola, FNP   10 mg at 09/23/15 7017  . LORazepam (ATIVAN) tablet 1 mg  1 mg Oral Q4H PRN Ursula Alert, MD       Or  . LORazepam (ATIVAN) injection 1 mg  1 mg Intramuscular Q4H PRN Saramma Eappen, MD      . magnesium hydroxide (MILK OF MAGNESIA) suspension 30 mL  30 mL Oral Daily PRN Benjamine Mola, FNP      . nicotine polacrilex (NICORETTE) gum 2 mg  2 mg Oral PRN Benjamine Mola, FNP      . pantoprazole (PROTONIX) EC tablet 40 mg  40 mg Oral Daily Benjamine Mola, FNP   40 mg at 09/23/15 0851  . PARoxetine (PAXIL) tablet 30 mg  30 mg Oral Daily Benjamine Mola, FNP   30 mg at 09/23/15 0851  . rosuvastatin (CRESTOR) tablet 20 mg  20 mg Oral Daily Benjamine Mola, FNP   20 mg at 09/23/15 7939  . zolpidem (AMBIEN) tablet 5 mg  5 mg Oral QHS PRN Benjamine Mola, FNP   5 mg at 09/22/15 2119   Lab Results:  Results for orders placed or performed during the hospital encounter of 09/21/15 (from the past 48 hour(s))  Basic metabolic panel     Status: Abnormal   Collection Time: 09/23/15  6:03 AM  Result Value Ref Range   Sodium 139 135 - 145 mmol/L   Potassium 3.6 3.5 - 5.1 mmol/L   Chloride 106 101 - 111 mmol/L   CO2 29 22 - 32 mmol/L   Glucose, Bld 89 65 - 99 mg/dL   BUN 16 6 - 20 mg/dL   Creatinine, Ser 0.93 0.61 - 1.24 mg/dL   Calcium 8.9 8.9 - 10.3 mg/dL   GFR calc non Af  Amer >60 >60 mL/min   GFR calc Af Amer >60 >60 mL/min    Comment: (NOTE) The eGFR has been calculated using the CKD EPI equation. This calculation has not been validated in all clinical situations. eGFR's persistently <60 mL/min  signify possible Chronic Kidney Disease.    Anion gap 4 (L) 5 - 15    Comment: Performed at [facility]    Blood Alcohol level:  Lab Results  Component Value Date   [facility] <5 09/20/2015   ETH <5 09/06/2015   Physical Findings: AIMS: Facial and Oral Movements Muscles of Facial Expression: None, normal Lips and Perioral Area: None, normal Jaw: None, normal Tongue: None, normal,Extremity Movements Upper (arms, wrists, hands, fingers): None, normal Lower (legs, knees, ankles, toes): None, normal, Trunk Movements Neck, shoulders, hips: None, normal, Overall Severity Severity of abnormal movements (highest score from questions above): None, normal Incapacitation due to abnormal movements: None, normal Patient's awareness of abnormal movements (rate only patient's report): No Awareness, Dental Status Current problems with teeth and/or dentures?: Yes Does patient usually wear dentures?: No  CIWA:  CIWA-Ar Total: 3 COWS:  COWS Total Score: 1  Musculoskeletal: Strength & Muscle Tone: within normal limits Gait & Station: normal Patient leans: N/A  Psychiatric Specialty Exam: Physical Exam  Nursing note and vitals reviewed. Psychiatric: His mood appears anxious. He exhibits a depressed mood.    Review of Systems  Psychiatric/Behavioral: Positive for depression. The patient is nervous/anxious.    No chest pain, no shortness of breath at room air, no nausea, no vomiting, describes some ankle pain today  Blood pressure 140/83, pulse 66, temperature 98.2 F (36.8 C), temperature source Oral, resp. rate 18, height 5' 9"  (1.753 m), weight 83.689 kg (184 lb 8 oz), SpO2 100 %.Body mass index is 27.23 kg/(m^2).  General Appearance: Fairly Groomed   Eye Contact:  Good  Speech:  Normal Rate  Volume:  Normal  Mood:  Depressed and Hopeless  Affect:  Appropriate and reactive, smiles at times  Thought Process:  Linear  Orientation:  Full (Time, Place, and Person)  Thought Content:  Denies any hallucinations, delusions or paranoia.  Suicidal Thoughts:  "Just fleeting thoughts"  Homicidal Thoughts:  No   Memory:  recent and remote grossly intact   Judgement:  Fair  Insight:  Fair  Psychomotor Activity:  Normal  Concentration:  Concentration: Good and Attention Span: Good  Recall:  Good  Fund of Knowledge:  Good  Language:  Good  Akathisia:  Negative  Handed:  Right  AIMS (if indicated):     Assets:  Communication Skills Desire for Improvement Resilience  ADL's:  Intact  Cognition:  WNL  Sleep:  Number of Hours: 6.5   Treatment Plan Summary: Daily contact with patient to assess and evaluate symptoms and progress in treatment, Medication management, Plan inpatient treatment and medications as below Encourage ongoing group , milieu participation to work on coping skills and symptom reduction Encourage ongoing efforts /focus on recovery, relapse prevention  Continue the Atripla for management of HIV infection. Continue the Paxil 30 mgrs QDAY for depression, anxiety Continue the Ibuprofen PRNs for pain, as needed. Continue Protonix for GERD, reflux symptoms. Continue Ambien 5 mgrs QHS PRN for insomnia. Continue the Crestor 20 mg for high cholesterol, Lisinopril 10 mg HTN, Norvacs 10 mg for HTN, ASA 81 mg for heart health, Coreg 25 mg for  HTN. Discused with CSW disposition. planning, pending.   Lindell Spar I, NP PMHNP-BC, FNP-BC 09/23/2015, 1:45 PM

## 2015-09-23 NOTE — BHH Group Notes (Signed)
[place] Group Notes:  (Nursing/MHT/Case Management/Adjunct)  Date:  09/23/2015  Time:  0900  Type of Therapy:  Recovery Group:  The group focuses on teaching patients the need to change unhealthy behaviors and included patients identifying behaviors they will change Summary of Progress/Problems:  Pt did not attend.  Lauralyn Primes 09/23/2015, 11:39 AM

## 2015-09-23 NOTE — BHH Suicide Risk Assessment (Signed)
[place] INPATIENT:  Family/Significant Other Suicide Prevention Education  Suicide Prevention Education:  Patient Refusal for Family/Significant Other Suicide Prevention Education: The patient Nathan Santana has refused to provide written consent for family/significant other to be provided Family/Significant Other Suicide Prevention Education during admission and/or prior to discharge.  Physician notified.  SPE completed with pt, as pt refused to consent to family contact. SPI pamphlet provided to pt and pt was encouraged to share information with support network, ask questions, and talk about any concerns relating to SPE. Pt denies access to guns/firearms and verbalized understanding of information provided. Mobile Crisis information also provided to pt.   Smart, Suhana Wilner LCSW 09/23/2015, 3:30 PM

## 2015-09-24 NOTE — Progress Notes (Signed)
D Fred remains  Emotionally stuck..He  has spent much of the day berating himself ( for relapsing). He shakes his head from side to side. He says " i don't know what I was thinking....". A He completes his daily assessment and on it he writes he has experienced SI today and he  rates his depression, hopelessness and anxeity " 7/7/7/", respectively. R Positive reinforcement is offered and pt encouraged to work his program, to stay in the moment and stop beating himslef up.

## 2015-09-24 NOTE — Care Management Utilization Note (Signed)
   Per State Regulation 482.30  This chart was reviewed for necessity with respect to the patient's Admission/ Duration of stay.  Next review date: 09/28/15  Skipper Cliche  RN, BSN

## 2015-09-24 NOTE — Progress Notes (Signed)
D Pt. Denies SI and HI, reporting things have improved.  NO complaints of pain or discomfort noted at present time.  A Writer offered support and encouragement, discussed pt.'s day and reason for admission.  R Pt. Reports he was sober from 2008 to 2016.  Pt. Feels he needs to get out of [facility] and is hoping to discharge to a long term treatment program.  Pt. Is sad that he does not have family support.   Pt. Remains safe on the unit.

## 2015-09-24 NOTE — Progress Notes (Signed)
Pt declined at [facility] due to: amount of recent hospitalizations. Patient made aware. Referral faxed to pt's [facility] ([facility]). CSW spoke with Amber in admissions 09/24/2015 2:37 PM to verify that referral was received and in review. She will call back with decision within 24 hours.  Maxie Better, MSW, LCSW Clinical Social Worker 09/24/2015 2:37 PM

## 2015-09-24 NOTE — Progress Notes (Signed)
Recreation Therapy Notes  Date: 07.07.2017 Time: 9:30am Location: 300 Hall Group Room   Group Topic: Stress Management  Goal Area(s) Addresses:  Patient will actively participate in stress management techniques presented during session.   Behavioral Response: Did not attend.   Laureen Ochs Annjeanette Sarwar, LRT/CTRS        Jasmaine Rochel L 09/24/2015 11:40 AM

## 2015-09-24 NOTE — Progress Notes (Signed)
D.  Pt pleasant on approach, no complaints voiced.  Pt states that he doesn't know what to do.  He wants to work and get better, but states going to a homeless shelter isn't going to help with that.  He states that nobody in the community he has to live in seems to want to work, they all just want to drink and do drugs and that is all they think about.  He states that it is very hard to live in that environment because it is so depressing.  They tell him that everyone is the same but he states, that isn't true, he isn't like them in that he does want a better life and wants to be clean and sober. Pt feels hopeless about being discharged to a shelter as he states that he was last time and he relapsed.  Pt states that many of his peers here are not vested in treatment either and this is irritating to him so he just goes to his room.  Pt denies SI/HI/hallucinations at this time.  R.  Support and encouragement offered, medications given as ordered  R.  Pt remains safe on the unit, will continue to monitor.

## 2015-09-24 NOTE — Progress Notes (Signed)
Patient ID: Nathan Santana, male   DOB: 11-05-1960, 55 y.o.   MRN: [id number] Patient ID: Toma Arts, male   DOB: 1960/07/02, 55 y.o.   MRN: [id number] Patient ID: Kadden Osterhout, male   DOB: [birth year], 55 y.o.   MRN: [id number] Patient ID: Amilcar Reever, male   DOB: 01/27/1961, 55 y.o.   MRN: [id number] [facility] MD Progress Note  09/24/2015 3:46 PM Durrell Barajas  MRN:  [id number]   Subjective: [name] reports, "I'm feeling sad & nervous today. Waiting to hear if I got in to the treatment center in [place]. The Social Worker faxed some information to them yesterday".  Objective: [name] is seen & chart is reviewed. He is reporting that the symptoms of anxiety & sadness today. He continues to ruminate as to why he relapsed soon after leaving the hospital after discharge last Friday. He continues to say & maintain that he needs a long term substance abuse treatment after discharge this time. He says he has no where to go after discharge. He continues to endorse suicidal ideations, denies any intent or plans to hurt himself. He denies any HI, AVH. He is endorsing insomnia, this time due to worries & concerns about his substance abuse. [name] is reminded that he does have an order for Ambien 5 mg Q hs. He is visible on the unit & participating in the group milieu.  Principal Problem: Substance or medication-induced depressive disorder with onset during intoxication [facility])  Diagnosis:   Patient Active Problem List   Diagnosis Date Noted  . Cocaine use disorder, severe, dependence ([place]) [F14.20] 09/22/2015  . Substance or medication-induced depressive disorder with onset during intoxication ([place]) [F19.94] 09/22/2015  . Cannabis use disorder, moderate, dependence ([place]) [F12.20] 09/22/2015  . MDD (major depressive disorder), recurrent severe, without psychosis ([place]) [F33.2] 09/21/2015  . Hyperlipidemia LDL goal <130 [E78.5] 03/01/2015  . Orchalgia [N50.819]   . Testicular pain, right [N50.811]    . HIV (human immunodeficiency virus infection) ([place]) [Z21] 02/05/2015  . CAD (coronary artery disease) [I25.10] 02/05/2015  . Chest pain [R07.9] 02/05/2015  . Suicidal ideations [R45.851] 02/05/2015  . Hypertension [I10] 02/05/2015  . Anxiety [F41.9]   . Pain in the chest [R07.9]   . Depression [F32.9]   . Suicidal ideation [R45.851]    Total Time spent with patient: 15 minutes  Past Medical History:  Past Medical History  Diagnosis Date  . HIV infection ([place])   . Cancer ([place])   . Hypertension   . Angina pectoris ([place])   . Scrotal cyst   . Anxiety   . Depression   . Hypercholesteremia   . Myocardial infarct, old     Past Surgical History  Procedure Laterality Date  . Scrotal turmor removed     Family History:  Family History  Problem Relation Age of Onset  . Hypertension Other   . CAD Sister     Social History:  History  Alcohol Use No    Comment: one beer weekly     History  Drug Use  . Yes  . Special: Marijuana, Cocaine    Comment: Cocaine    Social History   Social History  . Marital Status: Single    Spouse Name: N/A  . Number of Children: 4  . Years of Education: N/A   Social History Main Topics  . Smoking status: Current Some Day Smoker -- 0.50 packs/day    Types: Cigars, Cigarettes  . Smokeless tobacco: None  . Alcohol Use: No  Comment: one beer weekly  . Drug Use: Yes    Special: Marijuana, Cocaine     Comment: Cocaine  . Sexual Activity: No   Other Topics Concern  . None   Social History Narrative   Additional Social History:   Sleep: Poor-(encouraged to ask for his Ambien prior to going to bed at night).  Appetite:  improved   Current Medications: Current Facility-Administered Medications  Medication Dose Route Frequency Provider Last Rate Last Dose  . acetaminophen (TYLENOL) tablet 650 mg  650 mg Oral Q6H PRN Benjamine Mola, FNP   650 mg at 09/23/15 0853  . alum & mag hydroxide-simeth (MAALOX/MYLANTA) 200-200-20 MG/5ML  suspension 30 mL  30 mL Oral Q4H PRN Benjamine Mola, FNP      . amLODipine (NORVASC) tablet 10 mg  10 mg Oral Daily Benjamine Mola, FNP   10 mg at 09/24/15 1314  . aspirin EC tablet 81 mg  81 mg Oral Daily Benjamine Mola, FNP   81 mg at 09/24/15 1315  . carvedilol (COREG) tablet 25 mg  25 mg Oral BID WC Benjamine Mola, FNP   25 mg at 09/24/15 1315  . efavirenz-emtricitabine-tenofovir (ATRIPLA) 600-200-300 MG per tablet 1 tablet  1 tablet Oral QHS Benjamine Mola, FNP   1 tablet at 09/24/15 1314  . lisinopril (PRINIVIL,ZESTRIL) tablet 10 mg  10 mg Oral Daily Benjamine Mola, FNP   10 mg at 09/24/15 1314  . LORazepam (ATIVAN) tablet 1 mg  1 mg Oral Q4H PRN Ursula Alert, MD       Or  . LORazepam (ATIVAN) injection 1 mg  1 mg Intramuscular Q4H PRN Saramma Eappen, MD      . magnesium hydroxide (MILK OF MAGNESIA) suspension 30 mL  30 mL Oral Daily PRN Benjamine Mola, FNP      . nicotine polacrilex (NICORETTE) gum 2 mg  2 mg Oral PRN Benjamine Mola, FNP      . pantoprazole (PROTONIX) EC tablet 40 mg  40 mg Oral Daily Benjamine Mola, FNP   40 mg at 09/24/15 1314  . PARoxetine (PAXIL) tablet 30 mg  30 mg Oral Daily Benjamine Mola, FNP   30 mg at 09/24/15 1314  . rosuvastatin (CRESTOR) tablet 20 mg  20 mg Oral Daily Benjamine Mola, FNP   20 mg at 09/24/15 1313  . zolpidem (AMBIEN) tablet 5 mg  5 mg Oral QHS PRN Benjamine Mola, FNP   5 mg at 09/23/15 2218   Lab Results:  Results for orders placed or performed during the hospital encounter of 09/21/15 (from the past 48 hour(s))  Basic metabolic panel     Status: Abnormal   Collection Time: 09/23/15  6:03 AM  Result Value Ref Range   Sodium 139 135 - 145 mmol/L   Potassium 3.6 3.5 - 5.1 mmol/L   Chloride 106 101 - 111 mmol/L   CO2 29 22 - 32 mmol/L   Glucose, Bld 89 65 - 99 mg/dL   BUN 16 6 - 20 mg/dL   Creatinine, Ser 0.93 0.61 - 1.24 mg/dL   Calcium 8.9 8.9 - 10.3 mg/dL   GFR calc non Af Amer >60 >60 mL/min   GFR calc Af Amer >60 >60 mL/min     Comment: (NOTE) The eGFR has been calculated using the CKD EPI equation. This calculation has not been validated in all clinical situations. eGFR's persistently <60 mL/min signify possible Chronic Kidney Disease.  Anion gap 4 (L) 5 - 15    Comment: Performed at [facility]    Blood Alcohol level:  Lab Results  Component Value Date   [place] <5 09/20/2015   ETH <5 09/06/2015   Physical Findings: AIMS: Facial and Oral Movements Muscles of Facial Expression: None, normal Lips and Perioral Area: None, normal Jaw: None, normal Tongue: None, normal,Extremity Movements Upper (arms, wrists, hands, fingers): None, normal Lower (legs, knees, ankles, toes): None, normal, Trunk Movements Neck, shoulders, hips: None, normal, Overall Severity Severity of abnormal movements (highest score from questions above): None, normal Incapacitation due to abnormal movements: None, normal Patient's awareness of abnormal movements (rate only patient's report): No Awareness, Dental Status Current problems with teeth and/or dentures?: No Does patient usually wear dentures?: No  CIWA:  CIWA-Ar Total: 3 COWS:  COWS Total Score: 1  Musculoskeletal: Strength & Muscle Tone: within normal limits Gait & Station: normal Patient leans: N/A  Psychiatric Specialty Exam: Physical Exam  Nursing note and vitals reviewed. Psychiatric: His mood appears anxious. He exhibits a depressed mood.    Review of Systems  Psychiatric/Behavioral: Positive for depression. The patient is nervous/anxious.    No chest pain, no shortness of breath at room air, no nausea, no vomiting, describes some ankle pain today  Blood pressure 156/92, pulse 63, temperature 98.1 F (36.7 C), temperature source Oral, resp. rate 18, height _0  (1.753 m), weight 83.689 kg (184 lb 8 oz), SpO2 100 %.Body mass index is 27.23 kg/(m^2).  General Appearance: Fairly Groomed  Eye Contact:  Good  Speech:  Normal Rate  Volume:   Normal  Mood:  Depressed and Hopeless  Affect:  Appropriate and reactive, smiles at times  Thought Process:  Linear  Orientation:  Full (Time, Place, and Person)  Thought Content:  Denies any hallucinations, delusions or paranoia.  Suicidal Thoughts:  "Just fleeting thoughts"  Homicidal Thoughts:  No   Memory:  recent and remote grossly intact   Judgement:  Fair  Insight:  Fair  Psychomotor Activity:  Normal  Concentration:  Concentration: Good and Attention Span: Good  Recall:  Good  Fund of Knowledge:  Good  Language:  Good  Akathisia:  Negative  Handed:  Right  AIMS (if indicated):     Assets:  Communication Skills Desire for Improvement Resilience  ADL's:  Intact  Cognition:  WNL  Sleep:  Number of Hours: 6.5   Treatment Plan Summary: Daily contact with patient to assess and evaluate symptoms and progress in treatment, Medication management, Plan inpatient treatment and medications as below Encourage ongoing group , milieu participation to work on coping skills and symptom reduction Encourage ongoing efforts /focus on recovery, relapse prevention  Continue the Atripla for management of HIV infection. Continue the Paxil 30 mgrs QDAY for depression, anxiety Continue the Ibuprofen PRNs for pain, as needed. Continue Protonix for GERD, reflux symptoms. Continue Ambien 5 mgrs QHS PRN for insomnia. Continue the Crestor 20 mg for high cholesterol, Lisinopril 10 mg HTN, Norvacs 10 mg for HTN, ASA 81 mg for heart health, Coreg 25 mg for  HTN. Discused with CSW disposition planning, pending. Will continue current plan of carea.   Lindell Spar I, NP PMHNP-BC, FNP-BC 09/24/2015, 3:46 PM

## 2015-09-24 NOTE — BHH Group Notes (Signed)
[facility] LCSW Group Therapy  09/24/2015 2:25 PM  Type of Therapy:  Group Therapy  Participation Level:  Active  Participation Quality:  Attentive  Affect:  Appropriate  Cognitive:  Alert and Oriented  Insight:  Improving  Engagement in Therapy:  Improving  Modes of Intervention:  Confrontation, Discussion, Education, Exploration, Problem-solving, Rapport Building, Socialization and Support  Summary of Progress/Problems: Feelings around Relapse. Group members discussed the meaning of relapse and shared personal stories of relapse, how it affected them and others, and how they perceived themselves during this time. Group members were encouraged to identify triggers, warning signs and coping skills used when facing the possibility of relapse. Social supports were discussed and explored in detail. Post Acute Withdrawal Syndrome (handout provided) was introduced and examined. Pt's were encouraged to ask questions, talk about key points associated with PAWS, and process this information in terms of relapse prevention. Nathan Santana was attentive and engaged during today's processing group. He shared his latest relapse story and stated that "I'm getting tired of living like this. I really want to get into a long long-term program." Nathan Santana was temporarily disheartened that he was turned down by [facility] but is hopeful that [facility] will accept him for long term treatment.   Smart, Yvetta Drotar LCSW 09/24/2015, 2:25 PM

## 2015-09-24 NOTE — BHH Group Notes (Signed)
[facility] LCSW Aftercare Discharge Planning Group Note   09/24/2015 9:43 AM  Participation Quality:  Invited. DID NOT ATTEND. Pt chose to rest in bed.   Smart, Nathan Scheck LCSW

## 2015-09-25 DIAGNOSIS — F1994 Other psychoactive substance use, unspecified with psychoactive substance-induced mood disorder: Secondary | ICD-10-CM

## 2015-09-25 DIAGNOSIS — R45851 Suicidal ideations: Secondary | ICD-10-CM

## 2015-09-25 MED ORDER — PAROXETINE HCL 20 MG PO TABS
40.0000 mg | ORAL_TABLET | Freq: Every day | ORAL | Status: DC
  Administered 2015-09-26 – 2015-10-04 (×9): 40 mg via ORAL
  Filled 2015-09-25 (×10): qty 2

## 2015-09-25 MED ORDER — HYDROXYZINE HCL 50 MG/ML IM SOLN
50.0000 mg | Freq: Four times a day (QID) | INTRAMUSCULAR | Status: DC | PRN
  Filled 2015-09-25: qty 1

## 2015-09-25 MED ORDER — HYDROXYZINE HCL 50 MG PO TABS
50.0000 mg | ORAL_TABLET | Freq: Every evening | ORAL | Status: DC | PRN
  Administered 2015-09-25: 50 mg via ORAL

## 2015-09-25 MED ORDER — HYDROXYZINE HCL 50 MG PO TABS
ORAL_TABLET | ORAL | Status: AC
  Filled 2015-09-25: qty 1

## 2015-09-25 NOTE — Progress Notes (Signed)
[facility] MD Progress Note  09/25/2015 12:03 PM Nathan Santana  MRN:  [id number]   Subjective: Nathan Santana reports, "I' still feels sad and depressed.  I cannot sleep very well at night.  I'm thinking about my brother died a few months ago while sleeping".  Objective: Nathan Santana is seen & chart is reviewed.  He continued to report symptoms of depression, anxiety and sadness.  He sleeping on and off.  He endorse racing thoughts.  He feels fatigue and lack of motivation to do things.  Lately he is thinking about his brother who died a few months ago while sleeping.  He regret about his drug use and he ruminates about his depression.  He is taking Paxil and Ambien at night.  He has no side effects.  He has no tremors or shakes.  He continued to endorse passive and fleeting suicidal thoughts but denies any plan or any intent.  He denies any hallucination.  He liked to do long-term inpatient rehabilitation upon discharge from the hospital.  He is going to the groups but he is very quiet and does not participates.  His appetite is fair.  Principal Problem: Substance or medication-induced depressive disorder with onset during intoxication [facility])  Diagnosis:   Patient Active Problem List   Diagnosis Date Noted  . Cocaine use disorder, severe, dependence ([place]) [F14.20] 09/22/2015  . Substance or medication-induced depressive disorder with onset during intoxication ([place]) [F19.94] 09/22/2015  . Cannabis use disorder, moderate, dependence ([place]) [F12.20] 09/22/2015  . MDD (major depressive disorder), recurrent severe, without psychosis ([place]) [F33.2] 09/21/2015  . Hyperlipidemia LDL goal <130 [E78.5] 03/01/2015  . Orchalgia [N50.819]   . Testicular pain, right [N50.811]   . HIV (human immunodeficiency virus infection) ([place]) [Z21] 02/05/2015  . CAD (coronary artery disease) [I25.10] 02/05/2015  . Chest pain [R07.9] 02/05/2015  . Suicidal ideations [R45.851] 02/05/2015  . Hypertension [I10] 02/05/2015  . Anxiety  [F41.9]   . Pain in the chest [R07.9]   . Depression [F32.9]   . Suicidal ideation [R45.851]    Total Time spent with patient: 20 minutes  Past Medical History:  Past Medical History  Diagnosis Date  . HIV infection ([place])   . Cancer ([place])   . Hypertension   . Angina pectoris ([place])   . Scrotal cyst   . Anxiety   . Depression   . Hypercholesteremia   . Myocardial infarct, old     Past Surgical History  Procedure Laterality Date  . Scrotal turmor removed     Family History:  Family History  Problem Relation Age of Onset  . Hypertension Other   . CAD Sister     Social History:  History  Alcohol Use No    Comment: one beer weekly     History  Drug Use  . Yes  . Special: Marijuana, Cocaine    Comment: Cocaine    Social History   Social History  . Marital Status: Single    Spouse Name: N/A  . Number of Children: 4  . Years of Education: N/A   Social History Main Topics  . Smoking status: Current Some Day Smoker -- 0.50 packs/day    Types: Cigars, Cigarettes  . Smokeless tobacco: None  . Alcohol Use: No     Comment: one beer weekly  . Drug Use: Yes    Special: Marijuana, Cocaine     Comment: Cocaine  . Sexual Activity: No   Other Topics Concern  . None   Social History Narrative  Additional Social History:   Sleep: Poor-(encouraged to ask for his Ambien prior to going to bed at night).  Appetite:  improved   Current Medications: Current Facility-Administered Medications  Medication Dose Route Frequency Provider Last Rate Last Dose  . acetaminophen (TYLENOL) tablet 650 mg  650 mg Oral Q6H PRN Benjamine Mola, FNP   650 mg at 09/23/15 0853  . alum & mag hydroxide-simeth (MAALOX/MYLANTA) 200-200-20 MG/5ML suspension 30 mL  30 mL Oral Q4H PRN Benjamine Mola, FNP      . amLODipine (NORVASC) tablet 10 mg  10 mg Oral Daily Benjamine Mola, FNP   10 mg at 09/24/15 1314  . aspirin EC tablet 81 mg  81 mg Oral Daily Benjamine Mola, FNP   81 mg at 09/24/15  1315  . carvedilol (COREG) tablet 25 mg  25 mg Oral BID WC Benjamine Mola, FNP   25 mg at 09/24/15 2143  . efavirenz-emtricitabine-tenofovir (ATRIPLA) 600-200-300 MG per tablet 1 tablet  1 tablet Oral QHS Benjamine Mola, FNP   1 tablet at 09/24/15 2143  . lisinopril (PRINIVIL,ZESTRIL) tablet 10 mg  10 mg Oral Daily Benjamine Mola, FNP   10 mg at 09/24/15 1314  . LORazepam (ATIVAN) tablet 1 mg  1 mg Oral Q4H PRN Ursula Alert, MD       Or  . LORazepam (ATIVAN) injection 1 mg  1 mg Intramuscular Q4H PRN Saramma Eappen, MD      . magnesium hydroxide (MILK OF MAGNESIA) suspension 30 mL  30 mL Oral Daily PRN Benjamine Mola, FNP      . nicotine polacrilex (NICORETTE) gum 2 mg  2 mg Oral PRN Benjamine Mola, FNP      . pantoprazole (PROTONIX) EC tablet 40 mg  40 mg Oral Daily Benjamine Mola, FNP   40 mg at 09/24/15 1314  . PARoxetine (PAXIL) tablet 30 mg  30 mg Oral Daily Benjamine Mola, FNP   30 mg at 09/24/15 1314  . rosuvastatin (CRESTOR) tablet 20 mg  20 mg Oral Daily Benjamine Mola, FNP   20 mg at 09/24/15 1313  . zolpidem (AMBIEN) tablet 5 mg  5 mg Oral QHS PRN Benjamine Mola, FNP   5 mg at 09/24/15 2143   Lab Results:  No results found for this or any previous visit (from the past 48 hour(s)).  Blood Alcohol level:  Lab Results  Component Value Date   ETH <5 09/20/2015   ETH <5 09/06/2015   Physical Findings: AIMS: Facial and Oral Movements Muscles of Facial Expression: None, normal Lips and Perioral Area: None, normal Jaw: None, normal Tongue: None, normal,Extremity Movements Upper (arms, wrists, hands, fingers): None, normal Lower (legs, knees, ankles, toes): None, normal, Trunk Movements Neck, shoulders, hips: None, normal, Overall Severity Severity of abnormal movements (highest score from questions above): None, normal Incapacitation due to abnormal movements: None, normal Patient's awareness of abnormal movements (rate only patient's report): No Awareness, Dental  Status Current problems with teeth and/or dentures?: No Does patient usually wear dentures?: No  CIWA:  CIWA-Ar Total: 4 COWS:  COWS Total Score: 1  Musculoskeletal: Strength & Muscle Tone: within normal limits Gait & Station: normal Patient leans: N/A  Psychiatric Specialty Exam: Physical Exam  Nursing note and vitals reviewed. Psychiatric: His mood appears anxious. He exhibits a depressed mood.    Review of Systems  Psychiatric/Behavioral: Positive for depression. The patient is nervous/anxious.    No chest pain, no  shortness of breath at room air, no nausea, no vomiting, describes some ankle pain today  Blood pressure 118/80, pulse 60, temperature 98.6 F (37 C), temperature source Oral, resp. rate 18, height 5\' 9"  (1.753 m), weight 83.689 kg (184 lb 8 oz), SpO2 100 %.Body mass index is 27.23 kg/(m^2).  General Appearance: Fairly Groomed  Eye Contact:  Good  Speech:  Normal Rate  Volume:  Normal  Mood:  Depressed and Hopeless  Affect:  Appropriate and reactive, smiles at times  Thought Process:  Linear  Orientation:  Full (Time, Place, and Person)  Thought Content:  Denies any hallucinations, delusions or paranoia.  Suicidal Thoughts:  Yes.  without intent/plan  Homicidal Thoughts:  No   Memory:  recent and remote grossly intact   Judgement:  Fair  Insight:  Fair  Psychomotor Activity:  Normal  Concentration:  Concentration: Good and Attention Span: Good  Recall:  Good  Fund of Knowledge:  Good  Language:  Good  Akathisia:  Negative  Handed:  Right  AIMS (if indicated):     Assets:  Communication Skills Desire for Improvement Resilience  ADL's:  Intact  Cognition:  WNL  Sleep:  Number of Hours: 6.5   Treatment Plan Summary: Daily contact with patient to assess and evaluate symptoms and progress in treatment, Medication management, Plan inpatient treatment and medications as below Encourage ongoing group , milieu participation to work on coping skills and symptom  reduction Encourage ongoing efforts /focus on recovery, relapse prevention  Continue the Atripla for management of HIV infection. Will Increase Paxil 40 mg daily for depression, anxiety Continue the Ibuprofen PRNs for pain, as needed. Continue Protonix for GERD, reflux symptoms. Discontinue Ambien and try Vistaril 50 mg for insomnia  Continue the Crestor 20 mg for high cholesterol, Lisinopril 10 mg HTN, Norvacs 10 mg for HTN, ASA 81 mg for heart health, Coreg 25 mg for  HTN. Discused with CSW disposition planning, pending. Will continue current plan of carea.   Tanish Prien T., MD  09/25/2015, 12:03 PM

## 2015-09-25 NOTE — Progress Notes (Signed)
[facility] Group Notes:  (Nursing/MHT/Case Management/Adjunct)  Date:  09/25/2015  Time:  2100  Type of Therapy:  wrap up group  Participation Level:  Active  Participation Quality:  Appropriate, Attentive, Sharing and Supportive  Affect:  Appropriate  Cognitive:  Appropriate  Insight:  Improving  Engagement in Group:  Engaged  Modes of Intervention:  Clarification, Education and Support  Summary of Progress/Problems:Pt really wants a long term rehabilitation program but would really like to stay in [place]. Pt does say that he needs to get out of [place]. Pt is looking at programs with the [place]. Pt shares that he has been reflecting a lot lately and " I have to make a change, I have to leave the past behind. I have always done what will make someone else happy, it is time Im happy in my own life". Pt reports having 8.5 years clean in the past.   Shellia Cleverly 09/25/2015, 11:25 PM

## 2015-09-25 NOTE — [facility] Group Notes (Signed)
[facility] Group Notes: (Clinical Social Work)   09/25/2015      Type of Therapy:  Group Therapy   Participation Level:  Did Not Attend despite MHT prompting   Selmer Dominion, LCSW 09/25/2015, 12:23 PM

## 2015-09-25 NOTE — Progress Notes (Signed)
[name] is upset today. He refused to get up this morning and take his medications. HE spoke with this Probation officer at lunch, sharing his feelings of guilt, frustration, and hopelessness, regarding the way he feels with regard to his relapse. A HE did complete his daily assessment and on it he wrote he has experienced SI today...but says " You know I'm not going to do anything while I'm here". He rates his depression, hopelessness and anxiety " 8/8/8/", respectively. R Safety in place.

## 2015-09-26 MED ORDER — HYDROXYZINE HCL 25 MG PO TABS
25.0000 mg | ORAL_TABLET | Freq: Every evening | ORAL | Status: DC | PRN
  Administered 2015-09-26 – 2015-09-29 (×3): 25 mg via ORAL
  Filled 2015-09-26 (×3): qty 1

## 2015-09-26 NOTE — Progress Notes (Signed)
[name] remains isolative, sad and depressed about his relapse. He takes his scheduled meds and says " I think iI'll just get back in the bed". A He completes his daily assessment and on it he wrote he has experienced SI today but he rates his depression, hopelessness and anxiety " 7/7/7/", respectively. R Safety in place.

## 2015-09-26 NOTE — Progress Notes (Signed)
Patient did not attend AA group tonight.

## 2015-09-26 NOTE — Progress Notes (Signed)
D.  Pt pleasant on approach, denies complaints at this time.  Positive for evening wrap up group, observed interacting appropriately with peers on the unit.  Pt denies SI/HI/hallucinations at this time.  A.  Support and encouragement offered, medication given as ordered  R.  Pt remains safe on the unit, will continue to monitor.

## 2015-09-26 NOTE — [facility] Group Notes (Signed)
[facility] Group Notes: (Clinical Social Work)   09/26/2015      Type of Therapy:  Group Therapy   Participation Level:  Did Not Attend despite MHT prompting   Selmer Dominion, LCSW 09/26/2015, 11:09 AM

## 2015-09-26 NOTE — Progress Notes (Signed)
D.  Pt pleasant on approach, denies complaints at this time other than anxiety medication from last night made him too drowsy all day.  Pt did not attend evening AA group as he states that AA and NA groups are depressing to him.  Pt denies SI/HI/hallucinations at this time.  A.  Support and encouragement offered, medication given as ordered  R.  Pt remains safe on the unit, will continue to monitor.

## 2015-09-26 NOTE — Progress Notes (Signed)
[facility] MD Progress Note  09/26/2015 10:03 AM Nathan Santana  MRN:  [id number]   Subjective: Nathan Santana reports, "I' I feel sedated and dizzy in the morning.  That sleep medicine is too strong.  ".  Objective: Nathan Santana is seen & chart is reviewed.  He took last night and Vistaril 50 mg and this morning he feeling dizzy and sedated.  He felt Vistaril is too strong but he like to keep an overdose.  He continued to endorse depression, ruminates about his distress her.  We increased Paxil yesterday he is taking 40 mg.  He continued to have passive suicidal thoughts but no plan.  He staying to himself and not going to the groups.  Patient like to go for long-term inpatient rehabilitation upon discharge.  Principal Problem: Substance or medication-induced depressive disorder with onset during intoxication [facility])  Diagnosis:   Patient Active Problem List   Diagnosis Date Noted  . Cocaine use disorder, severe, dependence ([place]) [F14.20] 09/22/2015  . Substance or medication-induced depressive disorder with onset during intoxication ([place]) [F19.94] 09/22/2015  . Cannabis use disorder, moderate, dependence ([place]) [F12.20] 09/22/2015  . MDD (major depressive disorder), recurrent severe, without psychosis ([place]) [F33.2] 09/21/2015  . Hyperlipidemia LDL goal <130 [E78.5] 03/01/2015  . Orchalgia [N50.819]   . Testicular pain, right [N50.811]   . HIV (human immunodeficiency virus infection) ([place]) [Z21] 02/05/2015  . CAD (coronary artery disease) [I25.10] 02/05/2015  . Chest pain [R07.9] 02/05/2015  . Suicidal ideations [R45.851] 02/05/2015  . Hypertension [I10] 02/05/2015  . Anxiety [F41.9]   . Pain in the chest [R07.9]   . Depression [F32.9]   . Suicidal ideation [R45.851]    Total Time spent with patient: 20 minutes  Past Medical History:  Past Medical History  Diagnosis Date  . HIV infection ([place])   . Cancer ([place])   . Hypertension   . Angina pectoris ([place])   . Scrotal cyst   . Anxiety   .  Depression   . Hypercholesteremia   . Myocardial infarct, old     Past Surgical History  Procedure Laterality Date  . Scrotal turmor removed     Family History:  Family History  Problem Relation Age of Onset  . Hypertension Other   . CAD Sister     Social History:  History  Alcohol Use No    Comment: one beer weekly     History  Drug Use  . Yes  . Special: Marijuana, Cocaine    Comment: Cocaine    Social History   Social History  . Marital Status: Single    Spouse Name: N/A  . Number of Children: 4  . Years of Education: N/A   Social History Main Topics  . Smoking status: Current Some Day Smoker -- 0.50 packs/day    Types: Cigars, Cigarettes  . Smokeless tobacco: None  . Alcohol Use: No     Comment: one beer weekly  . Drug Use: Yes    Special: Marijuana, Cocaine     Comment: Cocaine  . Sexual Activity: No   Other Topics Concern  . None   Social History Narrative   Additional Social History:   Sleep: Fair   Appetite:  improved   Current Medications: Current Facility-Administered Medications  Medication Dose Route Frequency Provider Last Rate Last Dose  . acetaminophen (TYLENOL) tablet 650 mg  650 mg Oral Q6H PRN Benjamine Mola, FNP   650 mg at 09/23/15 0853  . alum & mag hydroxide-simeth (MAALOX/MYLANTA) 200-200-20 MG/5ML suspension  30 mL  30 mL Oral Q4H PRN Benjamine Mola, FNP      . amLODipine (NORVASC) tablet 10 mg  10 mg Oral Daily Benjamine Mola, FNP   10 mg at 09/25/15 1204  . aspirin EC tablet 81 mg  81 mg Oral Daily Benjamine Mola, FNP   81 mg at 09/25/15 1204  . carvedilol (COREG) tablet 25 mg  25 mg Oral BID WC Benjamine Mola, FNP   25 mg at 09/25/15 1821  . efavirenz-emtricitabine-tenofovir (ATRIPLA) 600-200-300 MG per tablet 1 tablet  1 tablet Oral QHS Benjamine Mola, FNP   1 tablet at 09/25/15 2212  . hydrOXYzine (ATARAX/VISTARIL) tablet 50 mg  50 mg Oral QHS PRN Benjamine Mola, FNP   50 mg at 09/25/15 2212  . lisinopril (PRINIVIL,ZESTRIL)  tablet 10 mg  10 mg Oral Daily Benjamine Mola, FNP   10 mg at 09/25/15 1204  . LORazepam (ATIVAN) tablet 1 mg  1 mg Oral Q4H PRN Ursula Alert, MD   1 mg at 09/25/15 2212   Or  . LORazepam (ATIVAN) injection 1 mg  1 mg Intramuscular Q4H PRN Saramma Eappen, MD      . magnesium hydroxide (MILK OF MAGNESIA) suspension 30 mL  30 mL Oral Daily PRN Benjamine Mola, FNP      . nicotine polacrilex (NICORETTE) gum 2 mg  2 mg Oral PRN Benjamine Mola, FNP      . pantoprazole (PROTONIX) EC tablet 40 mg  40 mg Oral Daily Benjamine Mola, FNP   40 mg at 09/25/15 1204  . PARoxetine (PAXIL) tablet 40 mg  40 mg Oral Daily [name], MD      . rosuvastatin (CRESTOR) tablet 20 mg  20 mg Oral Daily Benjamine Mola, FNP   20 mg at 09/25/15 1205   Lab Results:  No results found for this or any previous visit (from the past 48 hour(s)).  Blood Alcohol level:  Lab Results  Component Value Date   ETH <5 09/20/2015   ETH <5 09/06/2015   Physical Findings: AIMS: Facial and Oral Movements Muscles of Facial Expression: None, normal Lips and Perioral Area: None, normal Jaw: None, normal Tongue: None, normal,Extremity Movements Upper (arms, wrists, hands, fingers): None, normal Lower (legs, knees, ankles, toes): None, normal, Trunk Movements Neck, shoulders, hips: None, normal, Overall Severity Severity of abnormal movements (highest score from questions above): None, normal Incapacitation due to abnormal movements: None, normal Patient's awareness of abnormal movements (rate only patient's report): No Awareness, Dental Status Current problems with teeth and/or dentures?: No Does patient usually wear dentures?: No  CIWA:  CIWA-Ar Total: 2 COWS:  COWS Total Score: 1  Musculoskeletal: Strength & Muscle Tone: within normal limits Gait & Station: normal Patient leans: N/A  Psychiatric Specialty Exam: Physical Exam  Nursing note and vitals reviewed. Psychiatric: His mood appears anxious. He exhibits a  depressed mood.    Review of Systems  Psychiatric/Behavioral: Positive for depression. The patient is nervous/anxious.    No chest pain, no shortness of breath at room air, no nausea, no vomiting, describes some ankle pain today  Blood pressure 134/86, pulse 73, temperature 98.3 F (36.8 C), temperature source Oral, resp. rate 16, height 5\' 9"  (1.753 m), weight 83.689 kg (184 lb 8 oz), SpO2 100 %.Body mass index is 27.23 kg/(m^2).  General Appearance: Fairly Groomed  Eye Contact:  Good  Speech:  Normal Rate  Volume:  Normal  Mood:  Depressed  Affect:  Appropriate and Congruent  Thought Process:  Linear  Orientation:  Full (Time, Place, and Person)  Thought Content:  Denies any hallucinations, delusions or paranoia.  Suicidal Thoughts:  Yes.  without intent/plan  Homicidal Thoughts:  No   Memory:  recent and remote grossly intact   Judgement:  Fair  Insight:  Fair  Psychomotor Activity:  Normal  Concentration:  Concentration: Good and Attention Span: Good  Recall:  Good  Fund of Knowledge:  Good  Language:  Good  Akathisia:  Negative  Handed:  Right  AIMS (if indicated):     Assets:  Communication Skills Desire for Improvement Resilience  ADL's:  Intact  Cognition:  WNL  Sleep:  Number of Hours: 4.5   Treatment Plan Summary: Daily contact with patient to assess and evaluate symptoms and progress in treatment, Medication management, Plan inpatient treatment and medications as below Encourage ongoing group , milieu participation to work on coping skills and symptom reduction Encourage ongoing efforts /focus on recovery, relapse prevention  Continue the Atripla for management of HIV infection. Continue Paxil 40 mg daily for depression, anxiety Continue the Ibuprofen PRNs for pain, as needed. Continue Protonix for GERD, reflux symptoms. Reduce Vistaril 25 mg mg for insomnia  Continue the Crestor 20 mg for high cholesterol, Lisinopril 10 mg HTN, Norvacs 10 mg for HTN, ASA 81 mg  for heart health, Coreg 25 mg for  HTN. Discused with CSW disposition planning, pending. Will continue current plan of carea.   Quantavia Frith T., MD  09/26/2015, 10:03 AM

## 2015-09-27 NOTE — Progress Notes (Signed)
[facility] MD Progress Note  09/27/2015 12:24 PM Nathan Santana  MRN:  [id number]   Subjective:  "I have some ideas on where I can go when I'm discharged."    Objective: Nathan Santana is seen & chart is reviewed.    He feels better.  He states that his meds were adjusted and he does not feel too sedated today.  His Paxil is at 40 mg and he feels slight improvement.  He is attending groups and is seen in milieu.  No disruptive behaviors noted.  Compliant with medications.  Principal Problem: Substance or medication-induced depressive disorder with onset during intoxication [facility])  Diagnosis:   Patient Active Problem List   Diagnosis Date Noted  . MDD (major depressive disorder), recurrent severe, without psychosis ([place]) [F33.2] 09/21/2015    Priority: High  . Cocaine use disorder, severe, dependence ([place]) [F14.20] 09/22/2015  . Substance or medication-induced depressive disorder with onset during intoxication ([place]) [F19.94] 09/22/2015  . Cannabis use disorder, moderate, dependence ([place]) [F12.20] 09/22/2015  . Hyperlipidemia LDL goal <130 [E78.5] 03/01/2015  . Orchalgia [N50.819]   . Testicular pain, right [N50.811]   . HIV (human immunodeficiency virus infection) ([place]) [Z21] 02/05/2015  . CAD (coronary artery disease) [I25.10] 02/05/2015  . Chest pain [R07.9] 02/05/2015  . Suicidal ideations [R45.851] 02/05/2015  . Hypertension [I10] 02/05/2015  . Anxiety [F41.9]   . Pain in the chest [R07.9]   . Depression [F32.9]   . Suicidal ideation [R45.851]    Total Time spent with patient: 20 minutes  Past Medical History:  Past Medical History  Diagnosis Date  . HIV infection ([place])   . Cancer ([place])   . Hypertension   . Angina pectoris ([place])   . Scrotal cyst   . Anxiety   . Depression   . Hypercholesteremia   . Myocardial infarct, old     Past Surgical History  Procedure Laterality Date  . Scrotal turmor removed     Family History:  Family History  Problem Relation Age of Onset  .  Hypertension Other   . CAD Sister     Social History:  History  Alcohol Use No    Comment: one beer weekly     History  Drug Use  . Yes  . Special: Marijuana, Cocaine    Comment: Cocaine    Social History   Social History  . Marital Status: Single    Spouse Name: N/A  . Number of Children: 4  . Years of Education: N/A   Social History Main Topics  . Smoking status: Current Some Day Smoker -- 0.50 packs/day    Types: Cigars, Cigarettes  . Smokeless tobacco: None  . Alcohol Use: No     Comment: one beer weekly  . Drug Use: Yes    Special: Marijuana, Cocaine     Comment: Cocaine  . Sexual Activity: No   Other Topics Concern  . None   Social History Narrative   Additional Social History:   Sleep: Fair   Appetite:  improved   Current Medications: Current Facility-Administered Medications  Medication Dose Route Frequency Provider Last Rate Last Dose  . acetaminophen (TYLENOL) tablet 650 mg  650 mg Oral Q6H PRN Benjamine Mola, FNP   650 mg at 09/23/15 0853  . alum & mag hydroxide-simeth (MAALOX/MYLANTA) 200-200-20 MG/5ML suspension 30 mL  30 mL Oral Q4H PRN Benjamine Mola, FNP      . amLODipine (NORVASC) tablet 10 mg  10 mg Oral Daily Benjamine Mola, FNP  10 mg at 09/27/15 0840  . aspirin EC tablet 81 mg  81 mg Oral Daily Benjamine Mola, FNP   81 mg at 09/27/15 0840  . carvedilol (COREG) tablet 25 mg  25 mg Oral BID WC Benjamine Mola, FNP   25 mg at 09/27/15 0840  . efavirenz-emtricitabine-tenofovir (ATRIPLA) 600-200-300 MG per tablet 1 tablet  1 tablet Oral QHS Benjamine Mola, FNP   1 tablet at 09/26/15 2147  . hydrOXYzine (ATARAX/VISTARIL) tablet 25 mg  25 mg Oral QHS PRN [name], MD   25 mg at 09/26/15 2147  . lisinopril (PRINIVIL,ZESTRIL) tablet 10 mg  10 mg Oral Daily Benjamine Mola, FNP   10 mg at 09/27/15 0840  . LORazepam (ATIVAN) tablet 1 mg  1 mg Oral Q4H PRN Ursula Alert, MD   1 mg at 09/25/15 2212   Or  . LORazepam (ATIVAN) injection 1 mg  1 mg  Intramuscular Q4H PRN Saramma Eappen, MD      . magnesium hydroxide (MILK OF MAGNESIA) suspension 30 mL  30 mL Oral Daily PRN Benjamine Mola, FNP      . nicotine polacrilex (NICORETTE) gum 2 mg  2 mg Oral PRN Benjamine Mola, FNP      . pantoprazole (PROTONIX) EC tablet 40 mg  40 mg Oral Daily Benjamine Mola, FNP   40 mg at 09/27/15 0840  . PARoxetine (PAXIL) tablet 40 mg  40 mg Oral Daily [name], MD   40 mg at 09/27/15 0840  . rosuvastatin (CRESTOR) tablet 20 mg  20 mg Oral Daily Benjamine Mola, FNP   20 mg at 09/27/15 0840   Lab Results:  No results found for this or any previous visit (from the past 98 hour(s)).  Blood Alcohol level:  Lab Results  Component Value Date   ETH <5 09/20/2015   ETH <5 09/06/2015   Physical Findings: AIMS: Facial and Oral Movements Muscles of Facial Expression: None, normal Lips and Perioral Area: None, normal Jaw: None, normal Tongue: None, normal,Extremity Movements Upper (arms, wrists, hands, fingers): None, normal Lower (legs, knees, ankles, toes): None, normal, Trunk Movements Neck, shoulders, hips: None, normal, Overall Severity Severity of abnormal movements (highest score from questions above): None, normal Incapacitation due to abnormal movements: None, normal Patient's awareness of abnormal movements (rate only patient's report): No Awareness, Dental Status Current problems with teeth and/or dentures?: No Does patient usually wear dentures?: No  CIWA:  CIWA-Ar Total: 2 COWS:  COWS Total Score: 1  Musculoskeletal: Strength & Muscle Tone: within normal limits Gait & Station: normal Patient leans: N/A  Psychiatric Specialty Exam: Physical Exam  Nursing note and vitals reviewed. Psychiatric: His mood appears anxious. He exhibits a depressed mood.    Review of Systems  Psychiatric/Behavioral: Positive for depression. The patient is nervous/anxious.    No chest pain, no shortness of breath at room air, no nausea, no vomiting,  describes some ankle pain today  Blood pressure 132/94, pulse 68, temperature 98.6 F (37 C), temperature source Oral, resp. rate 16, height 5\' 9"  (1.753 m), weight 83.689 kg (184 lb 8 oz), SpO2 100 %.Body mass index is 27.23 kg/(m^2).  General Appearance: Fairly Groomed  Eye Contact:  Good  Speech:  Normal Rate  Volume:  Normal  Mood:  Depressed  Affect:  Appropriate and Congruent  Thought Process:  Linear  Orientation:  Full (Time, Place, and Person)  Thought Content:  Denies any hallucinations, delusions or paranoia.  Suicidal Thoughts:  No  Homicidal Thoughts:  No   Memory:  recent and remote grossly intact   Judgement:  Fair  Insight:  Fair  Psychomotor Activity:  Normal  Concentration:  Concentration: Good and Attention Span: Good  Recall:  Good  Fund of Knowledge:  Good  Language:  Good  Akathisia:  Negative  Handed:  Right  AIMS (if indicated):     Assets:  Communication Skills Desire for Improvement Resilience  ADL's:  Intact  Cognition:  WNL  Sleep:  Number of Hours: 5.75   Treatment Plan Summary: Daily contact with patient to assess and evaluate symptoms and progress in treatment, Medication management, Plan inpatient treatment and medications as below Encourage ongoing group , milieu participation to work on coping skills and symptom reduction Encourage ongoing efforts /focus on recovery, relapse prevention  Continue the Atripla for management of HIV infection. Continue Paxil 40 mg daily for depression, anxiety Continue the Ibuprofen PRNs for pain, as needed. Continue Protonix for GERD, reflux symptoms. Reduce Vistaril 25 mg mg for insomnia  Continue the Crestor 20 mg for high cholesterol, Lisinopril 10 mg HTN, Norvacs 10 mg for HTN, ASA 81 mg for heart health, Coreg 25 mg for  HTN. Discused with CSW disposition planning, pending. Will continue current plan of care.   Janett Labella, NP [facility] 09/27/2015, 12:24 PM    Agree with NP Progress Note, as  above

## 2015-09-27 NOTE — Progress Notes (Signed)
Recreation Therapy Notes   Date: 07.10.2017 Time: 9:30am  Location: [address] Group Room   Group Topic: Stress Management  Goal Area(s) Addresses:  Patient will actively participate in stress management techniques presented during session.   Behavioral Response: Did not attend.   Laureen Ochs Rayhaan Huster, LRT/CTRS        Bra[place] Dack L 09/27/2015 10:19 AM

## 2015-09-27 NOTE — Progress Notes (Signed)
Patient declined at [facility] due to medical issues.  Tilden Fossa, LCSW Clinical Social Worker [facility] [phone]

## 2015-09-27 NOTE — Progress Notes (Signed)
Adult Psychoeducational Group Note  Date:  09/27/2015 Time:  10:08 PM  Group Topic/Focus:  Wrap-Up Group:   The focus of this group is to help patients review their daily goal of treatment and discuss progress on daily workbooks.  Participation Level:  Active  Participation Quality:  Appropriate  Affect:  Appropriate  Cognitive:  Alert  Insight: Appropriate  Engagement in Group:  Engaged  Modes of Intervention:  Discussion  Additional Comments:  Patient states "it's just another day". Patient goal for today was "to keep focus on myself"   [place] 09/27/2015, 10:08 PM

## 2015-09-27 NOTE — Tx Team (Signed)
Interdisciplinary Treatment Plan Update (Adult)  Date:  09/27/2015  Time Reviewed:  9:30am  Progress in Treatment: Attending groups: Intermittently Participating in groups: Yes- when he attends Taking medication as prescribed:  Yes. Tolerating medication:  Yes. Family/Significant othe contact made:  SPE required for this pt.  Patient understands diagnosis:  Yes. and As evidenced by:  seeking treatment for SI, depression/mood instability, cocaine abuse, and for medication stabilization. Discussing patient identified problems/goals with staff:  Yes. Medical problems stabilized or resolved:  Yes. Denies suicidal/homicidal ideation: Yes. Issues/concerns per patient self-inventory:  Other:  Discharge Plan or Barriers:  7/5: CSW assessing. Pt was last admitted to [facility] 09/07/15 and 08/17/15 for similar issues and was referred to [place] for medication management with [name] (NP) and therapy with [name] (Counselor).   7/10: Patient interested in long-term residential care. CSW and patient exploring options including [facility].  Reason for Continuation of Hospitalization: Depression Medication stabilization Withdrawal symptoms  Comments:  Nathan Santana is a 55 y.o. male presenting for depression and substance abuse.  At that time, pt denied SI. Pt was given resources from [facility] and advised to keep his appt with [facility] next [facility]. Pt now presented at [facility] for the same concerns. Pt wants IP hospitalization. Pt denied SI to the triage RN, but told the PA that he was suicidal with a plan to OD. Writer spoke to pt about this and he indicated that he thought that Probation officer understood that he was suicidal b/c he was depressed. Diagnosis: Cocaine induced depressive disorder; MDD, recurrent episode, severe  Estimated length of stay:  2-4 days   New goal(s): to develop effective aftercare plan.   Additional Comments:  Patient and CSW reviewed pt's identified  goals and treatment plan. Patient verbalized understanding and agreed to treatment plan. CSW reviewed [facility] "Discharge Process and Patient Involvement" Form. Pt verbalized understanding of information provided and signed form.    Review of initial/current patient goals per problem list:  1. Goal(s): Patient will participate in aftercare plan  Met: No.   Target date: at discharge  As evidenced by: Patient will participate within aftercare plan AEB aftercare provider and housing plan at discharge being identified.  09/22/15: CSW assessing for appropriate referrals. During admission last month, pt was referred to [place] [facility] for o/p mental health needs.  7/10: Goal not met: CSW assessing for appropriate referrals for pt and will have follow up secured prior to d/c.   2. Goal (s): Patient will exhibit decreased depressive symptoms and suicidal ideations.  Met: No.    Target date: at discharge  As evidenced by: Patient will utilize self rating of depression at 3 or below and demonstrate decreased signs of depression or be deemed stable for discharge by MD.  09/22/15: Pt rates depression as high. Reports SI "on and off" but currently denies. Pt denies HI/AVH.  7/10: Patient rates depression at 8, denies SI.   3. Goal(s): Patient will demonstrate decreased signs of withdrawal due to substance abuse  Met:Yes   Target date:at discharge   As evidenced by: Patient will produce a CIWA/COWS score of 0, have stable vitals signs, and no symptoms of withdrawal.  7/5: Pt reports minimal withdrawals with COWS of 1 and high BP.  7/10: Goal met. No withdrawal symptoms reported at this time per medical chart.   Attendees: Patient:    Family:    Physician: Dr. Parke Poisson 09/27/2015 9:30 AM  Nursing: Marcella Dubs, Darrol Angel, Marilynne Halsted, RN 09/27/2015 9:30 AM  Clinical Social Worker: Tilden Fossa, LCSW 09/27/2015 9:30 AM  Other: [name], LCSWA 09/27/2015 9:30  AM  Other:  09/27/2015 9:30 AM  Other:  09/27/2015 9:30 AM  Other: May Antony Salmon, NP 09/27/2015 9:30 AM  Other:    Other:         Tilden Fossa, LCSW Clinical Social Worker [facility] [phone]

## 2015-09-27 NOTE — Progress Notes (Signed)
D: Patient denies SI/HI and A/V hallucinations; patient reports sleep is poor; reports appetite is fair; reports energy level is low; rates depression as 8/10; rates hopelessness 8/10; rates anxiety as 8/10; patient reports a lot of stressors; patient stated talked about his living situation; patient also talked about he probably will be losing everything that he owns  A: Monitored q 15 minutes; patient encouraged to attend groups; patient educated about medications; patient given medications per physician orders; patient encouraged to express feelings and/or concerns  R: Patient is cooperative and pleasant; patient is depressed; patient has minimal interaction with peers;  patient was able to set goal to talk with staff 1:1 when having feelings of SI; patient is taking medications as prescribed and tolerating medications; patient is attending most groups

## 2015-09-27 NOTE — BHH Group Notes (Addendum)
Pt attended spiritual care group on grief and loss facilitated by chaplain Jerene Pitch   Group opened with brief discussion and psycho-social ed around grief and loss in relationships and in relation to self - identifying life patterns, circumstances, changes that cause losses. Established group norm of speaking from own life experience. Group goal of establishing open and affirming space for members to share loss and experience with grief, normalize grief experience and provide psycho social education and grief support.   Nathan Santana was present and engaged throughout group.  He verbally engaged voluntarily.  Somewhat monopolizing when speaking of his experience, but easily redirectable by facilitator.  He identified ways in which grief has impacted his life.  Identified past losses and his experiences of others not caring for him or others relating to him out of impression of him as addict has made it difficult to trust that people will care for him.  He named specific instances where he interpreted people not caring for him and stated he needs to find ways to care for himself - most notably by setting boundaries with family members.  Spoke specifically of taking care of and paying for a vehicle for a family member.     [name], [name]

## 2015-09-27 NOTE — BHH Group Notes (Signed)
[facility] LCSW Group Therapy 09/27/2015  1:15 PM   Type of Therapy: Group Therapy  Participation Level: Did Not Attend. Patient invited to participate but declined.   Tilden Fossa, MSW, [place] Clinical Social Worker [facility] [phone]

## 2015-09-28 NOTE — Progress Notes (Addendum)
CSW met with pt individually. Pt made aware that he was declined at both [facility]. Pt given information to [facility] in [place], [place]. He was asked to call that facility for more information, as he is concerned about price. Pt declined information about [facility] "they will rip me off." He is hoping to go to [place] from the hospital. "It is pretty up there and I've been there a few times."   Maxie Better, MSW, LCSW Clinical Social Worker 09/28/2015 2:06 PM   Pt called [facility]-$3500 admission fee. Pt not interested. CSW called Anderson Malta in admissions at [facility] to discuss pt's denial and advocate for him to have second look. Pt also called Arbie Cookey at [facility] requesting screening date--pt verified that his medicaid is [employer]. Facesheet showing [place] address printed for pt. Pt and CSW also left message for [place] requesting phone interview for admission.  Maxie Better, MSW, LCSW Clinical Social Worker 09/28/2015 2:30 PM

## 2015-09-28 NOTE — Progress Notes (Signed)
DAR NOTE: Patient presents with anxious affect and depressed mood.  Denies pain, auditory and visual hallucinations.  Rates depression at 9, hopelessness at 9, and anxiety at 9.  Maintained on routine safety checks.  Medications given as prescribed.  Support and encouragement offered as needed.  Attended group and participated.  States goal for today is "finding a long term program."  Patient observed socializing with peers in the dayroom.  Offered no complaint.

## 2015-09-28 NOTE — [facility] Group Notes (Signed)
[facility] LCSW Group Therapy  09/28/2015 1:21 PM  Type of Therapy:  Group Therapy  Participation Level:  Did Not Attend-pt chose to rest in room. Invited.   Summary of Progress/Problems: MHA Speaker came to talk about his personal journey with substance abuse and addiction.   Smart, Nathan Gainer LCSW 09/28/2015, 1:21 PM

## 2015-09-28 NOTE — Progress Notes (Signed)
Patient attended AA group tonight. 

## 2015-09-28 NOTE — Progress Notes (Signed)
Pt has been observed on the unit in the dayroom watching TV and talking with peers.  Pt reports he is "just hanging in there".  He wants to go to a long term program, preferably a 6 mon-1 year program, but he would accept a 28 day if that what he can get into.  He is homeless, but reports a good relationship with his children.  He wants to get his life back on track to be a good example to his family.  He denies SI/HI/AVH.  He is not having any withdrawal symptoms.  Pt makes his needs known to staff, and is med compliant.  He makes his needs known to staff.  Support and encouragement offered.  Discharge plans are in process.  Safety maintained with q15 minute checks.

## 2015-09-28 NOTE — Progress Notes (Signed)
Patient ID: Nathan Santana, male   DOB: 1960-11-24, 55 y.o.   MRN: [id number] [facility] MD Progress Note  09/28/2015 4:31 PM Nathan Santana  MRN:  [id number]   Subjective:  Patient states "I just need to get in control again. I need another chance. I am tired of this lifestyle of using. I have been doing this for 35 years. I feel a little down because I'm having trouble getting accepted into a program. I don't have the money to pay up front. But I'm hopeful that I will get accepted somewhere soon."   Objective: Nathan Santana is seen & chart is reviewed.   He feels better but remains worried about his next phase of treatment planning.  His Paxil is at 40 mg and he feels slight improvement.  He is attending groups and is seen in milieu.  No disruptive behaviors noted.  Compliant with medications. He admits some of his depression is related to his substance abuse. After leaving [facility] patient spent large amount of money on cocaine. Patient continues to express frustration over not being accepted to a residential treatment program. He was recently discharged from [facility] but relapsed on cocaine resulting in readmission. Per Education officer, museum note today several facilities have been contacted. Patient was made aware that he was declined at both [place] by social work. He declined information about [employer] stating "I have to manage my own money." The patient and CSW also left message for [employer] requesting phone interview for admission according to today's notes. On admission patient was positive for cocaine and marijuana.   Principal Problem: Substance or medication-induced depressive disorder with onset during intoxication [facility])  Diagnosis:   Patient Active Problem List   Diagnosis Date Noted  . Cocaine use disorder, severe, dependence ([place]) [F14.20] 09/22/2015  . Substance or medication-induced depressive disorder with onset during intoxication  ([place]) [F19.94] 09/22/2015  . Cannabis use disorder, moderate, dependence ([place]) [F12.20] 09/22/2015  . MDD (major depressive disorder), recurrent severe, without psychosis ([place]) [F33.2] 09/21/2015  . Hyperlipidemia LDL goal <130 [E78.5] 03/01/2015  . Orchalgia [N50.819]   . Testicular pain, right [N50.811]   . HIV (human immunodeficiency virus infection) ([place]) [Z21] 02/05/2015  . CAD (coronary artery disease) [I25.10] 02/05/2015  . Chest pain [R07.9] 02/05/2015  . Suicidal ideations [R45.851] 02/05/2015  . Hypertension [I10] 02/05/2015  . Anxiety [F41.9]   . Pain in the chest [R07.9]   . Depression [F32.9]   . Suicidal ideation [R45.851]    Total Time spent with patient: 20 minutes  Past Medical History:  Past Medical History  Diagnosis Date  . HIV infection ([place])   . Cancer ([place])   . Hypertension   . Angina pectoris ([place])   . Scrotal cyst   . Anxiety   . Depression   . Hypercholesteremia   . Myocardial infarct, old     Past Surgical History  Procedure Laterality Date  . Scrotal turmor removed     Family History:  Family History  Problem Relation Age of Onset  . Hypertension Other   . CAD Sister     Social History:  History  Alcohol Use No    Comment: one beer weekly     History  Drug Use  . Yes  . Special: Marijuana, Cocaine    Comment: Cocaine    Social History   Social History  . Marital Status: Single    Spouse Name: N/A  . Number of Children: 4  .  Years of Education: N/A   Social History Main Topics  . Smoking status: Current Some Day Smoker -- 0.50 packs/day    Types: Cigars, Cigarettes  . Smokeless tobacco: None  . Alcohol Use: No     Comment: one beer weekly  . Drug Use: Yes    Special: Marijuana, Cocaine     Comment: Cocaine  . Sexual Activity: No   Other Topics Concern  . None   Social History Narrative   Additional Social History:   Sleep: Fair   Appetite:  improved   Current Medications: Current Facility-Administered  Medications  Medication Dose Route Frequency Provider Last Rate Last Dose  . acetaminophen (TYLENOL) tablet 650 mg  650 mg Oral Q6H PRN Benjamine Mola, FNP   650 mg at 09/28/15 1106  . alum & mag hydroxide-simeth (MAALOX/MYLANTA) 200-200-20 MG/5ML suspension 30 mL  30 mL Oral Q4H PRN Benjamine Mola, FNP      . amLODipine (NORVASC) tablet 10 mg  10 mg Oral Daily Benjamine Mola, FNP   10 mg at 09/28/15 0806  . aspirin EC tablet 81 mg  81 mg Oral Daily Benjamine Mola, FNP   81 mg at 09/28/15 [id number]  . carvedilol (COREG) tablet 25 mg  25 mg Oral BID WC Benjamine Mola, FNP   25 mg at 09/28/15 0806  . efavirenz-emtricitabine-tenofovir (ATRIPLA) 600-200-300 MG per tablet 1 tablet  1 tablet Oral QHS Benjamine Mola, FNP   1 tablet at 09/27/15 2158  . hydrOXYzine (ATARAX/VISTARIL) tablet 25 mg  25 mg Oral QHS PRN [name], MD   25 mg at 09/26/15 2147  . lisinopril (PRINIVIL,ZESTRIL) tablet 10 mg  10 mg Oral Daily Benjamine Mola, FNP   10 mg at 09/28/15 0806  . LORazepam (ATIVAN) tablet 1 mg  1 mg Oral Q4H PRN Ursula Alert, MD   1 mg at 09/25/15 2212   Or  . LORazepam (ATIVAN) injection 1 mg  1 mg Intramuscular Q4H PRN Saramma Eappen, MD      . magnesium hydroxide (MILK OF MAGNESIA) suspension 30 mL  30 mL Oral Daily PRN Benjamine Mola, FNP      . nicotine polacrilex (NICORETTE) gum 2 mg  2 mg Oral PRN Benjamine Mola, FNP      . pantoprazole (PROTONIX) EC tablet 40 mg  40 mg Oral Daily Benjamine Mola, FNP   40 mg at 09/28/15 0806  . PARoxetine (PAXIL) tablet 40 mg  40 mg Oral Daily [name], MD   40 mg at 09/28/15 0806  . rosuvastatin (CRESTOR) tablet 20 mg  20 mg Oral Daily Benjamine Mola, FNP   20 mg at 09/28/15 [id number]   Lab Results:  No results found for this or any previous visit (from the past 48 hour(s)).  Blood Alcohol level:  Lab Results  Component Value Date   ETH <5 09/20/2015   ETH <5 09/06/2015   Physical Findings: AIMS: Facial and Oral Movements Muscles of Facial Expression:  None, normal Lips and Perioral Area: None, normal Jaw: None, normal Tongue: None, normal,Extremity Movements Upper (arms, wrists, hands, fingers): None, normal Lower (legs, knees, ankles, toes): None, normal, Trunk Movements Neck, shoulders, hips: None, normal, Overall Severity Severity of abnormal movements (highest score from questions above): None, normal Incapacitation due to abnormal movements: None, normal Patient's awareness of abnormal movements (rate only patient's report): No Awareness, Dental Status Current problems with teeth and/or dentures?: Yes Does patient usually wear  dentures?: No  CIWA:  CIWA-Ar Total: 2 COWS:  COWS Total Score: 1  Musculoskeletal: Strength & Muscle Tone: within normal limits Gait & Station: normal Patient leans: N/A  Psychiatric Specialty Exam: Physical Exam  Nursing note and vitals reviewed. Psychiatric: His mood appears anxious. He exhibits a depressed mood.    Review of Systems  Psychiatric/Behavioral: Positive for depression and substance abuse. Negative for suicidal ideas, hallucinations and memory loss. The patient is nervous/anxious. The patient does not have insomnia.    No chest pain, no shortness of breath at room air, no nausea, no vomiting, describes some ankle pain today  Blood pressure 148/87, pulse 67, temperature 98 F (36.7 C), temperature source Oral, resp. rate 18, height 5\' 9"  (1.753 m), weight 83.689 kg (184 lb 8 oz), SpO2 100 %.Body mass index is 27.23 kg/(m^2).  General Appearance: Fairly Groomed  Eye Contact:  Good  Speech:  Normal Rate  Volume:  Normal  Mood:  Depressed  Affect:  Congruent  Thought Process:  Linear  Orientation:  Full (Time, Place, and Person)  Thought Content:  Denies any hallucinations, delusions or paranoia.  Suicidal Thoughts:  No  Homicidal Thoughts:  No   Memory:  recent and remote grossly intact   Judgement:  Fair  Insight:  Fair  Psychomotor Activity:  Normal  Concentration:   Concentration: Good and Attention Span: Good  Recall:  Good  Fund of Knowledge:  Good  Language:  Good  Akathisia:  Negative  Handed:  Right  AIMS (if indicated):     Assets:  Communication Skills Desire for Improvement Resilience  ADL's:  Intact  Cognition:  WNL  Sleep:  Number of Hours: 4.75   Treatment Plan Summary: Daily contact with patient to assess and evaluate symptoms and progress in treatment, Medication management, Plan inpatient treatment and medications as below Encourage ongoing group , milieu participation to work on coping skills and symptom reduction Encourage ongoing efforts /focus on recovery, relapse prevention  Continue the Atripla for management of HIV infection. Continue Paxil 40 mg daily for depression, anxiety Continue the Ibuprofen PRNs for pain, as needed. Continue Protonix for GERD, reflux symptoms. Reduce Vistaril 25 mg mg for insomnia  Continue the Crestor 20 mg for high cholesterol, Lisinopril 10 mg HTN, Norvacs 10 mg for HTN, ASA 81 mg for heart health, Coreg 25 mg for  HTN. Discused with CSW disposition planning, pending. Will continue current plan of care.   Elmarie Shiley, NP [facility] 09/28/2015, 4:31 PM   Agree with NP Progress Note, as above

## 2015-09-29 ENCOUNTER — Other Ambulatory Visit: Payer: Self-pay

## 2015-09-29 MED ORDER — NITROGLYCERIN 0.4 MG SL SUBL
0.4000 mg | SUBLINGUAL_TABLET | SUBLINGUAL | Status: DC | PRN
  Administered 2015-09-29: 0.4 mg via SUBLINGUAL
  Filled 2015-09-29: qty 1
  Filled 2015-09-29: qty 25

## 2015-09-29 NOTE — Progress Notes (Signed)
Patient ID: Nathan Santana, male   DOB: 02-05-1961, 55 y.o.   MRN: [id number] PER STATE REGULATIONS 482.30  THIS CHART WAS REVIEWED FOR MEDICAL NECESSITY WITH RESPECT TO THE PATIENT'S ADMISSION/ DURATION OF STAY.  NEXT REVIEW DATE:   10/03/2015 Chauncy Lean, RN, BSN CASE MANAGER

## 2015-09-29 NOTE — Progress Notes (Signed)
Adult Psychoeducational Group Note  Date:  09/29/2015 Time:  9:23 PM  Group Topic/Focus:  Wrap-Up Group:   The focus of this group is to help patients review their daily goal of treatment and discuss progress on daily workbooks.  Participation Level:  Active  Participation Quality:  Appropriate and Sharing  Affect:  Appropriate  Cognitive:  Alert, Appropriate and Oriented  Insight: Appropriate  Engagement in Group:  Engaged  Modes of Intervention:  Discussion and Support  Additional Comments:  Patient attended group and expressed that he had a good day.  For his personal development, he has been spending time analyzing his errors and is working on having positive thoughts.  Kayven Aldaco W Kery Batzel [id number], 9:23 PM

## 2015-09-29 NOTE — Progress Notes (Signed)
Patient ID: Nathan Santana, male   DOB: Oct 04, 1960, 55 y.o.   MRN: [id number] [facility] MD Progress Note  09/29/2015 5:26 PM Adeola Gegg  MRN:  [id number]   Subjective:  Patient reports he remains depressed, and anxious, but does acknowledge he is better than on admission . He reports that his vehicle has been a source of anxiety, but now a family member has taken it for safe keeping, and this has helped decrease overall anxiety. He does continue to ruminate and focus on disposition options, and as per staff has expressed anxiety and frustration regarding disposition options  Currently denies medication side effects.  Objective: Patient seen and case discussed with treatment team. Patient presents with mild depression, but affect more reactive and smiles often and appropriately . Reports anxiety, depression, but acknowledges some improvement, and denies any active SI at this time, contracts for safety on the unit  This morning experienced brief episode of chest pain, managed effectively  with Nitro, subsided after a few minutes . He has a history of CAD and states he sometimes has chest pains particularly when he does not sleep well . No current chest pain, no shortness of breath, no current physical symptoms. No disruptive behaviors on unit, going to  some groups .    Principal Problem: Substance or medication-induced depressive disorder with onset during intoxication [facility])  Diagnosis:   Patient Active Problem List   Diagnosis Date Noted  . Cocaine use disorder, severe, dependence ([place]) [F14.20] 09/22/2015  . Substance or medication-induced depressive disorder with onset during intoxication ([place]) [F19.94] 09/22/2015  . Cannabis use disorder, moderate, dependence ([place]) [F12.20] 09/22/2015  . MDD (major depressive disorder), recurrent severe, without psychosis ([place]) [F33.2] 09/21/2015  . Hyperlipidemia LDL goal <130 [E78.5] 03/01/2015  . Orchalgia [N50.819]   . Testicular pain, right  [N50.811]   . HIV (human immunodeficiency virus infection) ([place]) [Z21] 02/05/2015  . CAD (coronary artery disease) [I25.10] 02/05/2015  . Chest pain [R07.9] 02/05/2015  . Suicidal ideations [R45.851] 02/05/2015  . Hypertension [I10] 02/05/2015  . Anxiety [F41.9]   . Pain in the chest [R07.9]   . Depression [F32.9]   . Suicidal ideation [R45.851]    Total Time spent with patient: 20 minutes  Past Medical History:  Past Medical History  Diagnosis Date  . HIV infection ([place])   . Cancer ([place])   . Hypertension   . Angina pectoris ([place])   . Scrotal cyst   . Anxiety   . Depression   . Hypercholesteremia   . Myocardial infarct, old     Past Surgical History  Procedure Laterality Date  . Scrotal turmor removed     Family History:  Family History  Problem Relation Age of Onset  . Hypertension Other   . CAD Sister     Social History:  History  Alcohol Use No    Comment: one beer weekly     History  Drug Use  . Yes  . Special: Marijuana, Cocaine    Comment: Cocaine    Social History   Social History  . Marital Status: Single    Spouse Name: N/A  . Number of Children: 4  . Years of Education: N/A   Social History Main Topics  . Smoking status: Current Some Day Smoker -- 0.50 packs/day    Types: Cigars, Cigarettes  . Smokeless tobacco: None  . Alcohol Use: No     Comment: one beer weekly  . Drug Use: Yes    Special: Marijuana, Cocaine  Comment: Cocaine  . Sexual Activity: No   Other Topics Concern  . None   Social History Narrative   Additional Social History:   Sleep: Fair   Appetite:  improved   Current Medications: Current Facility-Administered Medications  Medication Dose Route Frequency Provider Last Rate Last Dose  . acetaminophen (TYLENOL) tablet 650 mg  650 mg Oral Q6H PRN Benjamine Mola, FNP   650 mg at 09/28/15 1106  . alum & mag hydroxide-simeth (MAALOX/MYLANTA) 200-200-20 MG/5ML suspension 30 mL  30 mL Oral Q4H PRN Benjamine Mola, FNP       . amLODipine (NORVASC) tablet 10 mg  10 mg Oral Daily Benjamine Mola, FNP   10 mg at 09/29/15 0752  . aspirin EC tablet 81 mg  81 mg Oral Daily Benjamine Mola, FNP   81 mg at 09/29/15 [id number]  . carvedilol (COREG) tablet 25 mg  25 mg Oral BID WC Benjamine Mola, FNP   25 mg at 09/29/15 0752  . efavirenz-emtricitabine-tenofovir (ATRIPLA) 600-200-300 MG per tablet 1 tablet  1 tablet Oral QHS Benjamine Mola, FNP   1 tablet at 09/28/15 2201  . hydrOXYzine (ATARAX/VISTARIL) tablet 25 mg  25 mg Oral QHS PRN [name], MD   25 mg at 09/29/15 0000  . lisinopril (PRINIVIL,ZESTRIL) tablet 10 mg  10 mg Oral Daily Benjamine Mola, FNP   10 mg at 09/29/15 0752  . LORazepam (ATIVAN) tablet 1 mg  1 mg Oral Q4H PRN Ursula Alert, MD   1 mg at 09/25/15 2212   Or  . LORazepam (ATIVAN) injection 1 mg  1 mg Intramuscular Q4H PRN Saramma Eappen, MD      . magnesium hydroxide (MILK OF MAGNESIA) suspension 30 mL  30 mL Oral Daily PRN Benjamine Mola, FNP      . nicotine polacrilex (NICORETTE) gum 2 mg  2 mg Oral PRN Benjamine Mola, FNP      . nitroGLYCERIN (NITROSTAT) SL tablet 0.4 mg  0.4 mg Sublingual Q5 min PRN Jenne Campus, MD   0.4 mg at 09/29/15 0847  . pantoprazole (PROTONIX) EC tablet 40 mg  40 mg Oral Daily Benjamine Mola, FNP   40 mg at 09/29/15 0753  . PARoxetine (PAXIL) tablet 40 mg  40 mg Oral Daily [name], MD   40 mg at 09/29/15 0752  . rosuvastatin (CRESTOR) tablet 20 mg  20 mg Oral Daily Benjamine Mola, FNP   20 mg at 09/29/15 [id number]   Lab Results:  No results found for this or any previous visit (from the past 48 hour(s)).  Blood Alcohol level:  Lab Results  Component Value Date   ETH <5 09/20/2015   ETH <5 09/06/2015   Physical Findings: AIMS: Facial and Oral Movements Muscles of Facial Expression: None, normal Lips and Perioral Area: None, normal Jaw: None, normal Tongue: None, normal,Extremity Movements Upper (arms, wrists, hands, fingers): None, normal Lower (legs, knees,  ankles, toes): None, normal, Trunk Movements Neck, shoulders, hips: None, normal, Overall Severity Severity of abnormal movements (highest score from questions above): None, normal Incapacitation due to abnormal movements: None, normal Patient's awareness of abnormal movements (rate only patient's report): No Awareness, Dental Status Current problems with teeth and/or dentures?: Yes Does patient usually wear dentures?: No  CIWA:  CIWA-Ar Total: 2 COWS:  COWS Total Score: 1  Musculoskeletal: Strength & Muscle Tone: within normal limits Gait & Station: normal Patient leans: N/A  Psychiatric Specialty Exam:  Physical Exam  Nursing note and vitals reviewed. Psychiatric: His mood appears anxious. He exhibits a depressed mood.    Review of Systems  Psychiatric/Behavioral: Positive for depression and substance abuse. Negative for suicidal ideas, hallucinations and memory loss. The patient is nervous/anxious. The patient does not have insomnia.    No chest pain at present , no shortness of breath at room air, no nausea, no vomiting, describes some ankle pain today  Blood pressure 141/73, pulse 61, temperature 98.9 F (37.2 C), temperature source Oral, resp. rate 18, height 5\' 9"  (1.753 m), weight 184 lb 8 oz (83.689 kg), SpO2 100 %.Body mass index is 27.23 kg/(m^2).  General Appearance: Fairly Groomed  Eye Contact:  Good  Speech:  Normal Rate  Volume:  Normal  Mood:  Reports some improvement although still feels depressed   Affect:  More reactive   Thought Process:  Linear  Orientation:  Full (Time, Place, and Person)  Thought Content:  Denies any hallucinations, delusions or paranoia.  Suicidal Thoughts:  No at this time denies any suicidal ideations, denies any self injurious ideations, and contracts for safety on the unit   Homicidal Thoughts:  No  Denies any homicidal ideations or violent ideations   Memory:  recent and remote grossly intact   Judgement:  Improving   Insight:   Improving   Psychomotor Activity:  Normal  Concentration:  Concentration: Good and Attention Span: Good  Recall:  Good  Fund of Knowledge:  Good  Language:  Good  Akathisia:  Negative  Handed:  Right  AIMS (if indicated):     Assets:  Communication Skills Desire for Improvement Resilience  ADL's:  Intact  Cognition:  WNL  Sleep:  Number of Hours: 5  Assessment - patient reports some partial improvement of mood , and does present with a fuller range of affect . Denies SI at this time. Tolerating Paxil well at this time. Focused on disposition planning . No chest pain at this time, after a brief episode of chest discomfort this AM resolved with Nitro.  Treatment Plan Summary: Daily contact with patient to assess and evaluate symptoms and progress in treatment, Medication management, Plan inpatient treatment and medications as below Encourage ongoing group , milieu participation to work on coping skills and symptom reduction Encourage ongoing efforts /focus on recovery, relapse prevention  Continue the Atripla for management of HIV infection. Continue Paxil 40 mg daily for depression, anxiety Continue the Ibuprofen PRNs for pain, as needed. Continue Protonix for GERD, reflux symptoms. Reduce Vistaril 25 mg mg for insomnia  Continue the Crestor 20 mg for high cholesterol, Lisinopril 10 mg HTN, Norvacs 10 mg for HTN, ASA 81 mg for heart health, Coreg 25 mg for  HTN. Treatment team and [place] working on disposition planning Will continue current plan of care.   Neita Garnet, MD [facility] 09/29/2015, 5:26 PM

## 2015-09-29 NOTE — Progress Notes (Signed)
Recreation Therapy Notes   Date: 07.12.2017 Time: 9:30am Location: [address] Group Room   Group Topic: Stress Management  Goal Area(s) Addresses:  Patient will actively participate in stress management techniques presented during session.   Behavioral Response: Did not attend.   Laureen Ochs Arnav Cregg, LRT/CTRS        Cheyenna Pankowski L 09/29/2015 11:43 AM

## 2015-09-29 NOTE — Progress Notes (Signed)
DAR: Pt was up this morning in the day, pt complained of not sleeping well because room mate kept waking him up. Pt also complained of chest pain, VS taken, EKG done, and nitroglycerine 0.4 mg given one time pt reported relief after that. Pt states his depression is 7, hopelessness is 7, and anxiety is 7. Pt's safety ensured with 15 minute and environmental checks. Pt currently denies SI/HI and A/V hallucinations. Pt verbally agrees to seek staff if SI/HI or A/VH occurs and to consult with staff before acting on any harmful thoughts. Will continue POC.

## 2015-09-29 NOTE — Progress Notes (Signed)
Pt has been out in the milieu, presents anxious and frustrated that he has not found a place to go at discharge.  He denies SI/HI/AVH.  He is not having any withdrawal symptoms.  He has been declined at some of the programs that have been contacted, and this is frustrating him.  He has been going to groups and participating.  He makes his needs known to staff.  Support and encouragement offered.  He says he is going to try to go to bed without a sleep aid tonight, but he did not sleep last night.  He is aware that a sleep aid is available.  Discharge plans are in process.  Safety maintained with q15 minute checks.

## 2015-09-30 ENCOUNTER — Telehealth: Payer: Self-pay

## 2015-09-30 MED ORDER — MIRTAZAPINE 7.5 MG PO TABS
7.5000 mg | ORAL_TABLET | Freq: Every day | ORAL | Status: DC
  Administered 2015-09-30 – 2015-10-01 (×2): 7.5 mg via ORAL
  Filled 2015-09-30 (×4): qty 1

## 2015-09-30 NOTE — [facility] Group Notes (Signed)
[place] LCSW Group Therapy  09/30/2015 2:29 PM  Type of Therapy:  Group Therapy  Participation Level:  Did Not Attend-pt invited. Chose to rest in bed.   Summary of Progress/Problems: Today's Topic: Overcoming Obstacles. Patients identified one short term goal and potential obstacles in reaching this goal. Patients processed barriers involved in overcoming these obstacles. Patients identified steps necessary for overcoming these obstacles and explored motivation (internal and external) for facing these difficulties head on. ]  Smart, Lucretia Pendley LCSW 09/30/2015, 2:29 PM

## 2015-09-30 NOTE — Progress Notes (Signed)
Patient ID: Nathan Santana, male   DOB: August 21, 1960, 55 y.o.   MRN: [id number]  Adult Psychoeducational Group Note  Date:  09/30/2015 Time: 09:00am   Group Topic/Focus:  Rediscovering Joy:   The focus of this group is to explore various ways to relieve stress in a positive manner.  Participation Level:  Active  Participation Quality:  Appropriate and Monopolizing  Affect:  Flat  Cognitive:  Alert and Oriented  Insight: Appropriate and Lacking  Engagement in Group:  Engaged  Modes of Intervention:  Discussion, Education and Support  Additional Comments:  Pt attended group, identifies telling others jokes is a way he will incorporate joy in his life.   Elenore Rota 09/30/2015, 10:19 AM

## 2015-09-30 NOTE — Progress Notes (Signed)
Patient did attend the evening karaoke group. Pt was engaged and supportive but did not participate by singing a song.    

## 2015-09-30 NOTE — Progress Notes (Signed)
Patient ID: Nathan Santana, male   DOB: April 04, 1960, 55 y.o.   MRN: [id number]  Pt currently presents with a masked affect and anxious behavior. Pt reports that he "has done a lot of bad things in the past 55 years and I need to realize that I don't need to be ready to leave immediately." Pt reports that he feels like [facility] is a place of respit for him. Pt states that he has given up on his truck which in past admissions has been a motivating factor for his sobriety and release. Pt expresses a negative view of his future while in group.  Pt provided with medications per providers orders. Pt's labs and vitals were monitored throughout the day. Pt supported emotionally and encouraged to express concerns and questions. Pt educated on medications and assertiveness techniques.   Pt's safety ensured with 15 minute and environmental checks. Pt currently denies SI/HI and A/V hallucinations. Pt verbally agrees to seek staff if SI/HI or A/VH occurs and to consult with staff before acting on any harmful thoughts. Will continue POC.

## 2015-09-30 NOTE — Progress Notes (Signed)
Patient ID: Nathan Santana, male   DOB: 1960/07/27, 55 y.o.   MRN: [id number] Patient ID: Nathan Santana, male   DOB: July 05, 1960, 55 y.o.   MRN: [id number] [facility] MD Progress Note  09/30/2015 4:51 PM Nathan Santana  MRN:  [id number]   Subjective: Nathan Santana reports' "I feel ugly today, depressed, worried & anxious. I feel like a fucking shit today. I'm feeling very depressed because I lost everything that I worked for in my life. Now, I'm this homeless guy with no home, no transportation, looking for a place to call home & yet, no one wants me. Now, I'm looking at going to the [facility] treatment center, this is also temporary. And when I completed the program, then what? None of these places are in a position to help me find a permanent place to live. I feel aggravated. I did not sleep at all last night".  Objective: Patient seen and case discussed with treatment team. Patient presents with mild depression, but affect more reactive but sad today . Reports anxiety, depression, but acknowledges some improvement, and denies any active SI at this time, contracts for safety on the unit. Appears aggravated & hopeless today. He has a history of CAD and states he sometimes has chest pains particularly when he does not sleep well . No current chest pain, no shortness of breath, no current physical symptoms. No disruptive behaviors on unit, going to  some groups .  Principal Problem: Substance or medication-induced depressive disorder with onset during intoxication [facility])  Diagnosis:   Patient Active Problem List   Diagnosis Date Noted  . Cocaine use disorder, severe, dependence ([place]) [F14.20] 09/22/2015  . Substance or medication-induced depressive disorder with onset during intoxication ([place]) [F19.94] 09/22/2015  . Cannabis use disorder, moderate, dependence ([place]) [F12.20] 09/22/2015  . MDD (major depressive disorder), recurrent severe, without psychosis ([place]) [F33.2] 09/21/2015  . Hyperlipidemia  LDL goal <130 [E78.5] 03/01/2015  . Orchalgia [N50.819]   . Testicular pain, right [N50.811]   . HIV (human immunodeficiency virus infection) ([place]) [Z21] 02/05/2015  . CAD (coronary artery disease) [I25.10] 02/05/2015  . Chest pain [R07.9] 02/05/2015  . Suicidal ideations [R45.851] 02/05/2015  . Hypertension [I10] 02/05/2015  . Anxiety [F41.9]   . Pain in the chest [R07.9]   . Depression [F32.9]   . Suicidal ideation [R45.851]    Total Time spent with patient: 15 minutes  Past Medical History:  Past Medical History  Diagnosis Date  . HIV infection ([place])   . Cancer ([place])   . Hypertension   . Angina pectoris ([place])   . Scrotal cyst   . Anxiety   . Depression   . Hypercholesteremia   . Myocardial infarct, old     Past Surgical History  Procedure Laterality Date  . Scrotal turmor removed     Family History:  Family History  Problem Relation Age of Onset  . Hypertension Other   . CAD Sister     Social History:  History  Alcohol Use No    Comment: one beer weekly     History  Drug Use  . Yes  . Special: Marijuana, Cocaine    Comment: Cocaine    Social History   Social History  . Marital Status: Single    Spouse Name: N/A  . Number of Children: 4  . Years of Education: N/A   Social History Main Topics  . Smoking status: Current Some Day Smoker -- 0.50 packs/day    Types: Cigars, Cigarettes  . Smokeless  tobacco: None  . Alcohol Use: No     Comment: one beer weekly  . Drug Use: Yes    Special: Marijuana, Cocaine     Comment: Cocaine  . Sexual Activity: No   Other Topics Concern  . None   Social History Narrative   Additional Social History:   Sleep: Fair   Appetite:  improved   Current Medications: Current Facility-Administered Medications  Medication Dose Route Frequency Provider Last Rate Last Dose  . acetaminophen (TYLENOL) tablet 650 mg  650 mg Oral Q6H PRN Benjamine Mola, FNP   650 mg at 09/28/15 1106  . alum & mag hydroxide-simeth  (MAALOX/MYLANTA) 200-200-20 MG/5ML suspension 30 mL  30 mL Oral Q4H PRN Benjamine Mola, FNP      . amLODipine (NORVASC) tablet 10 mg  10 mg Oral Daily Benjamine Mola, FNP   10 mg at 09/30/15 0826  . aspirin EC tablet 81 mg  81 mg Oral Daily Benjamine Mola, FNP   81 mg at 09/30/15 [id number]  . carvedilol (COREG) tablet 25 mg  25 mg Oral BID WC Benjamine Mola, FNP   25 mg at 09/30/15 0826  . efavirenz-emtricitabine-tenofovir (ATRIPLA) 600-200-300 MG per tablet 1 tablet  1 tablet Oral QHS Benjamine Mola, FNP   1 tablet at 09/29/15 2304  . hydrOXYzine (ATARAX/VISTARIL) tablet 25 mg  25 mg Oral QHS PRN [name], MD   25 mg at 09/29/15 2305  . lisinopril (PRINIVIL,ZESTRIL) tablet 10 mg  10 mg Oral Daily Benjamine Mola, FNP   10 mg at 09/30/15 0826  . LORazepam (ATIVAN) tablet 1 mg  1 mg Oral Q4H PRN Ursula Alert, MD   1 mg at 09/25/15 2212   Or  . LORazepam (ATIVAN) injection 1 mg  1 mg Intramuscular Q4H PRN Saramma Eappen, MD      . magnesium hydroxide (MILK OF MAGNESIA) suspension 30 mL  30 mL Oral Daily PRN Benjamine Mola, FNP      . nicotine polacrilex (NICORETTE) gum 2 mg  2 mg Oral PRN Benjamine Mola, FNP      . nitroGLYCERIN (NITROSTAT) SL tablet 0.4 mg  0.4 mg Sublingual Q5 min PRN Jenne Campus, MD   0.4 mg at 09/29/15 0847  . pantoprazole (PROTONIX) EC tablet 40 mg  40 mg Oral Daily Benjamine Mola, FNP   40 mg at 09/30/15 0826  . PARoxetine (PAXIL) tablet 40 mg  40 mg Oral Daily [name], MD   40 mg at 09/30/15 0826  . rosuvastatin (CRESTOR) tablet 20 mg  20 mg Oral Daily Benjamine Mola, FNP   20 mg at 09/30/15 [id number]   Lab Results:  No results found for this or any previous visit (from the past 48 hour(s)).  Blood Alcohol level:  Lab Results  Component Value Date   ETH <5 09/20/2015   ETH <5 09/06/2015   Physical Findings: AIMS: Facial and Oral Movements Muscles of Facial Expression: None, normal Lips and Perioral Area: None, normal Jaw: None, normal Tongue: None,  normal,Extremity Movements Upper (arms, wrists, hands, fingers): None, normal Lower (legs, knees, ankles, toes): None, normal, Trunk Movements Neck, shoulders, hips: None, normal, Overall Severity Severity of abnormal movements (highest score from questions above): None, normal Incapacitation due to abnormal movements: None, normal Patient's awareness of abnormal movements (rate only patient's report): No Awareness, Dental Status Current problems with teeth and/or dentures?: Yes Does patient usually wear dentures?: No  CIWA:  CIWA-Ar Total: 2 COWS:  COWS Total Score: 1  Musculoskeletal: Strength & Muscle Tone: within normal limits Gait & Station: normal Patient leans: N/A  Psychiatric Specialty Exam: Physical Exam  Nursing note and vitals reviewed. Psychiatric: His mood appears anxious. He exhibits a depressed mood.    Review of Systems  Psychiatric/Behavioral: Positive for depression and substance abuse. Negative for suicidal ideas, hallucinations and memory loss. The patient is nervous/anxious. The patient does not have insomnia.    No chest pain at present , no shortness of breath at room air, no nausea, no vomiting, describes some ankle pain today  Blood pressure 131/88, pulse 72, temperature 98.4 F (36.9 C), temperature source Oral, resp. rate 18, height 5\' 9"  (1.753 m), weight 83.689 kg (184 lb 8 oz), SpO2 100 %.Body mass index is 27.23 kg/(m^2).  General Appearance: Fairly Groomed  Eye Contact:  Good  Speech:  Normal Rate  Volume:  Normal  Mood:  Reports some improvement although still feels depressed   Affect:  More reactive   Thought Process:  Linear  Orientation:  Full (Time, Place, and Person)  Thought Content:  Denies any hallucinations, delusions or paranoia.  Suicidal Thoughts:  No at this time denies any suicidal ideations, denies any self injurious ideations, and contracts for safety on the unit   Homicidal Thoughts:  No  Denies any homicidal ideations or violent  ideations   Memory:  recent and remote grossly intact   Judgement:  Improving   Insight:  Improving   Psychomotor Activity:  Normal  Concentration:  Concentration: Good and Attention Span: Good  Recall:  Good  Fund of Knowledge:  Good  Language:  Good  Akathisia:  Negative  Handed:  Right  AIMS (if indicated):     Assets:  Communication Skills Desire for Improvement Resilience  ADL's:  Intact  Cognition:  WNL  Sleep:  Number of Hours: 5   Assessment - patient reports some partial improvement of mood , and does present with a fuller range of affect . Denies SI at this time. Tolerating Paxil well at this time. Focused on disposition planning . No chest pain at this time, after a brief episode of chest discomfort this AM resolved with Nitro.   Treatment Plan Summary: Daily contact with patient to assess and evaluate symptoms and progress in treatment, Medication management, Plan inpatient treatment and medications as below Encourage ongoing group , milieu participation to work on coping skills and symptom reduction Encourage ongoing efforts /focus on recovery, relapse prevention  Continue the Atripla for management of HIV infection. Continue Paxil 40 mg daily for depression, anxiety Continue the Ibuprofen PRNs for pain, as needed. Continue Protonix for GERD, reflux symptoms. Continue Vistaril 25 mg mg for insomnia, initiated Mirtazapine 7.5 mg Q hs.  Continue the Crestor 20 mg for high cholesterol, Lisinopril 10 mg HTN, Norvacs 10 mg for HTN, ASA 81 mg for heart health, Coreg 25 mg for  HTN. Treatment team and [place] working on disposition planning Will continue current plan of care.   Lindell Spar I, NP [facility] 09/30/2015, 4:51 PM  Agree with NP Progress Note, as above

## 2015-09-30 NOTE — Progress Notes (Signed)
Pt reports that he is doing ok, but is still frustrated that he does not have a definite place to go.  He denies SI/HI/AVH.  He is not having any withdrawal symptoms.  He told Probation officer that he has a phone screening with [facility] on Monday.  He has not c/o any chest pain or voiced any needs or concerns this evening.  He makes his needs known to staff.  He has been polite and cooperative with staff.  Support and encouragement offered.  Discharge plans are in process.  Safety maintained with q15 checks.

## 2015-09-30 NOTE — Telephone Encounter (Signed)
Appointment scheduled and message left on voice mail for Lauren with behavioral health.   Laverle Patter, RN

## 2015-09-30 NOTE — Telephone Encounter (Signed)
-----   Message from Carley Hammed, LPN sent at [phone] 11:59 AM EDT ----- Regarding: appointment  Lauren from [facility] called wanting to set up an appointment for patient. Lauren says once an appointment is made it is ok to leave information on her voicemail at [phone]. Does he need an appointment with you for intake or just with an provider?

## 2015-10-01 MED ORDER — IBUPROFEN 600 MG PO TABS
600.0000 mg | ORAL_TABLET | Freq: Four times a day (QID) | ORAL | Status: DC | PRN
  Administered 2015-10-01 – 2015-10-03 (×4): 600 mg via ORAL
  Filled 2015-10-01 (×4): qty 1

## 2015-10-01 NOTE — Progress Notes (Signed)
Patient approached nurse 10/10 pain in left small toe, edema noted in toe, tender to touch, warm "I think I broke it getting out of the shower last night."  Patient complained of mild soreness last night and that he stubbed his toe.  Instructed to elevate it and keep ice on it.  Tylenol given.  Will notify staff to have patient evaluated in AM.  Call out to Priscille Loveless, FNP to request for ibuprofen/advice.  Notified patient of plan, patient asked to return to room and lay in bed with ice pack on foot.  Patient got agitated stating "Tylenol isn't going to do it.  The doctor isn't going to give me anything stronger.  Shit, this is the shit I had to deal with yesterday.  Can't even walk and I can't go to the ED."  Patient said "don't give me the ice pack, just let it swell."  Ice pack left in patient's room, nurse told him it would be available if he changed his mind, and that this would help the swelling and the pain.

## 2015-10-01 NOTE — Progress Notes (Signed)
DAR NOTE: Patient presents with anxious affect and depressed mood.  Complained of left pinky toe pain pumping in the bathroom. Pt offered tylenol and advil, pt declined stating " I want real pain medication like oxycodone, tylenol and advil irritates my stomach, I want to go to the ED to be checked out." Denies auditory and visual hallucinations. Pt refused to fill the self inventory form.  Maintained on Q 15 min routine safety checks.  Medications given as prescribed.  Support and encouragement offered as needed.  Patient observed socializing with peers in the dayroom, will continue to monitor.

## 2015-10-01 NOTE — Progress Notes (Signed)
  [facility] Adult Case Management Discharge Plan :  Will you be returning to the same living situation after discharge:  No.Pt has [facility] screening for possible admission on  At discharge, do you have transportation home?: Yes,  taxi voucher in chart. pt scheduled for discharge on monday morning no later than 7:20AM Do you have the ability to pay for your medications: Yes,  Medicare and [employer] Medicaid  Release of information consent forms completed and submitted to medical records by CSW.  Patient to Follow up at: Follow-up Information    Follow up with [facility].   Why:  Appt on this date at 8:00AM for screening.    Contact information:   [address] [place], [place] [address] Phone: [phone] Fax: [phone]      Follow up with [facility] On 10/25/2015.   Why:  Appt on this date at 2:30PM for Financial Counseling. Appt at 3:00PM to be seen by Dr. Megan Salon for medication management.    Contact information:   ATTN: Dr. Michel Bickers [address] [place], [place] [address] Phone: [phone] Fax: [phone]      Follow up with [facility].   Why:  Walk in between 8am-9am Monday through Friday for hospital follow-up/medication management/assessment for counseling services.    Contact information:   [address],  [address] Phone: [phone] Fax: [phone]      Next level of care provider has access to [place] and Suicide Prevention discussed: Yes,  SPE completed with pt; pt declined to consent to family contact. SPI pamphlet and Mobile Crisis information provided to pt.   Have you used any form of tobacco in the last 30 days? (Cigarettes, Smokeless Tobacco, Cigars, and/or Pipes): Yes  Has patient been referred to the Quitline?: Patient refused referral  Patient has been referred for addiction treatment: Yes  Smart, Zeke Aker LCSW 10/01/2015, 10:07 AM

## 2015-10-01 NOTE — Progress Notes (Signed)
Patient ID: Nathan Santana, male   DOB: 1960-11-23, 55 y.o.   MRN: [id number] Patient ID: Nathan Santana, male   DOB: Mar 21, 1960, 55 y.o.   MRN: [id number] Patient ID: Nathan Santana, male   DOB: 1961-01-12, 55 y.o.   MRN: [id number] [facility] MD Progress Note  10/01/2015 2:09 PM Nathan Santana  MRN:  [id number]   Subjective: Nathan Santana reports' "I'm in a bad mood right now. I bumped my left pinky toe coming out of the bathroom last night. My toe is hurting pretty bad. My mood swings are getting worse. I try to cope & deal with things as I see them, yet, I kind right on my face that I don't have a concrete plan on where to go after this hospital. I did not go to group today because I'm hurting".  Objective: Patient seen and case discussed with treatment team. Patient continues to present with mild depression, but affect more reactive but sad today . Reports mood swings, continue to acknowledge some of the improvement made, and denies any active SI at this time, contracts for safety on the unit. Appears aggravated & hopeless today with racing thoughts. He has a history of CAD and states he sometimes has chest pains particularly when he does not sleep well . No current chest pain, no shortness of breath, no current physical symptoms. No disruptive behaviors on unit, going to  some groups, but this afternoon, he is lying down in his bed in his room, citing toe pain. .  Principal Problem: Substance or medication-induced depressive disorder with onset during intoxication [facility])  Diagnosis:   Patient Active Problem List   Diagnosis Date Noted  . Cocaine use disorder, severe, dependence ([place]) [F14.20] 09/22/2015  . Substance or medication-induced depressive disorder with onset during intoxication ([place]) [F19.94] 09/22/2015  . Cannabis use disorder, moderate, dependence ([place]) [F12.20] 09/22/2015  . MDD (major depressive disorder), recurrent severe, without psychosis ([place]) [F33.2] 09/21/2015  . Hyperlipidemia  LDL goal <130 [E78.5] 03/01/2015  . Orchalgia [N50.819]   . Testicular pain, right [N50.811]   . HIV (human immunodeficiency virus infection) ([place]) [Z21] 02/05/2015  . CAD (coronary artery disease) [I25.10] 02/05/2015  . Chest pain [R07.9] 02/05/2015  . Suicidal ideations [R45.851] 02/05/2015  . Hypertension [I10] 02/05/2015  . Anxiety [F41.9]   . Pain in the chest [R07.9]   . Depression [F32.9]   . Suicidal ideation [R45.851]    Total Time spent with patient: 15 minutes  Past Medical History:  Past Medical History  Diagnosis Date  . HIV infection ([place])   . Cancer ([place])   . Hypertension   . Angina pectoris ([place])   . Scrotal cyst   . Anxiety   . Depression   . Hypercholesteremia   . Myocardial infarct, old     Past Surgical History  Procedure Laterality Date  . Scrotal turmor removed     Family History:  Family History  Problem Relation Age of Onset  . Hypertension Other   . CAD Sister     Social History:  History  Alcohol Use No    Comment: one beer weekly     History  Drug Use  . Yes  . Special: Marijuana, Cocaine    Comment: Cocaine    Social History   Social History  . Marital Status: Single    Spouse Name: N/A  . Number of Children: 4  . Years of Education: N/A   Social History Main Topics  . Smoking status: Current Some Day Smoker --  0.50 packs/day    Types: Cigars, Cigarettes  . Smokeless tobacco: None  . Alcohol Use: No     Comment: one beer weekly  . Drug Use: Yes    Special: Marijuana, Cocaine     Comment: Cocaine  . Sexual Activity: No   Other Topics Concern  . None   Social History Narrative   Additional Social History:   Sleep: Fair   Appetite:  improved   Current Medications: Current Facility-Administered Medications  Medication Dose Route Frequency Provider Last Rate Last Dose  . acetaminophen (TYLENOL) tablet 650 mg  650 mg Oral Q6H PRN Benjamine Mola, FNP   650 mg at 10/01/15 0410  . alum & mag hydroxide-simeth  (MAALOX/MYLANTA) 200-200-20 MG/5ML suspension 30 mL  30 mL Oral Q4H PRN Benjamine Mola, FNP      . amLODipine (NORVASC) tablet 10 mg  10 mg Oral Daily Benjamine Mola, FNP   10 mg at 10/01/15 0836  . aspirin EC tablet 81 mg  81 mg Oral Daily Benjamine Mola, FNP   81 mg at 10/01/15 0835  . carvedilol (COREG) tablet 25 mg  25 mg Oral BID WC Benjamine Mola, FNP   25 mg at 10/01/15 0835  . efavirenz-emtricitabine-tenofovir (ATRIPLA) 600-200-300 MG per tablet 1 tablet  1 tablet Oral QHS Benjamine Mola, FNP   1 tablet at 09/30/15 2318  . hydrOXYzine (ATARAX/VISTARIL) tablet 25 mg  25 mg Oral QHS PRN [name], MD   25 mg at 09/29/15 2305  . lisinopril (PRINIVIL,ZESTRIL) tablet 10 mg  10 mg Oral Daily Benjamine Mola, FNP   10 mg at 10/01/15 0836  . LORazepam (ATIVAN) tablet 1 mg  1 mg Oral Q4H PRN Ursula Alert, MD   1 mg at 09/25/15 2212   Or  . LORazepam (ATIVAN) injection 1 mg  1 mg Intramuscular Q4H PRN Saramma Eappen, MD      . magnesium hydroxide (MILK OF MAGNESIA) suspension 30 mL  30 mL Oral Daily PRN Benjamine Mola, FNP      . mirtazapine (REMERON) tablet 7.5 mg  7.5 mg Oral QHS Encarnacion Slates, NP   7.5 mg at 09/30/15 2318  . nicotine polacrilex (NICORETTE) gum 2 mg  2 mg Oral PRN Benjamine Mola, FNP      . nitroGLYCERIN (NITROSTAT) SL tablet 0.4 mg  0.4 mg Sublingual Q5 min PRN Jenne Campus, MD   0.4 mg at 09/29/15 0847  . pantoprazole (PROTONIX) EC tablet 40 mg  40 mg Oral Daily Benjamine Mola, FNP   40 mg at 10/01/15 0835  . PARoxetine (PAXIL) tablet 40 mg  40 mg Oral Daily [name], MD   40 mg at 10/01/15 0836  . rosuvastatin (CRESTOR) tablet 20 mg  20 mg Oral Daily Benjamine Mola, FNP   20 mg at 10/01/15 [id number]   Lab Results:  No results found for this or any previous visit (from the past 48 hour(s)).  Blood Alcohol level:  Lab Results  Component Value Date   ETH <5 09/20/2015   ETH <5 09/06/2015   Physical Findings: AIMS: Facial and Oral Movements Muscles of Facial  Expression: None, normal Lips and Perioral Area: None, normal Jaw: None, normal Tongue: None, normal,Extremity Movements Upper (arms, wrists, hands, fingers): None, normal Lower (legs, knees, ankles, toes): None, normal, Trunk Movements Neck, shoulders, hips: None, normal, Overall Severity Severity of abnormal movements (highest score from questions above): None, normal Incapacitation  due to abnormal movements: None, normal Patient's awareness of abnormal movements (rate only patient's report): No Awareness, Dental Status Current problems with teeth and/or dentures?: Yes Does patient usually wear dentures?: No  CIWA:  CIWA-Ar Total: 2 COWS:  COWS Total Score: 1  Musculoskeletal: Strength & Muscle Tone: within normal limits Gait & Station: normal Patient leans: N/A  Psychiatric Specialty Exam: Physical Exam  Nursing note and vitals reviewed. Psychiatric: His mood appears anxious. He exhibits a depressed mood.    Review of Systems  Psychiatric/Behavioral: Positive for depression and substance abuse. Negative for suicidal ideas, hallucinations and memory loss. The patient is nervous/anxious. The patient does not have insomnia.    No chest pain at present , no shortness of breath at room air, no nausea, no vomiting, describes some ankle pain today  Blood pressure 139/80, pulse 63, temperature 98.2 F (36.8 C), temperature source Oral, resp. rate 16, height 5\' 9"  (1.753 m), weight 83.689 kg (184 lb 8 oz), SpO2 100 %.Body mass index is 27.23 kg/(m^2).  General Appearance: Fairly Groomed  Eye Contact:  Good  Speech:  Normal Rate  Volume:  Normal  Mood:  Reports some improvement although still feels depressed   Affect:  More reactive   Thought Process:  Linear  Orientation:  Full (Time, Place, and Person)  Thought Content:  Denies any hallucinations, delusions or paranoia.  Suicidal Thoughts:  No at this time denies any suicidal ideations, denies any self injurious ideations, and  contracts for safety on the unit   Homicidal Thoughts:  No  Denies any homicidal ideations or violent ideations   Memory:  recent and remote grossly intact   Judgement:  Improving   Insight:  Improving   Psychomotor Activity:  Normal  Concentration:  Concentration: Good and Attention Span: Good  Recall:  Good  Fund of Knowledge:  Good  Language:  Good  Akathisia:  Negative  Handed:  Right  AIMS (if indicated):     Assets:  Communication Skills Desire for Improvement Resilience  ADL's:  Intact  Cognition:  WNL  Sleep:  Number of Hours: 3.75   Assessment - patient reports some partial improvement of mood , and does present with a fuller range of affect . Denies SI at this time. Tolerating Paxil well at this time. Focused on disposition planning . No chest pain at this time, after a brief episode of chest discomfort this AM resolved with Nitro.   Treatment Plan Summary: Daily contact with patient to assess and evaluate symptoms and progress in treatment, Medication management, Plan inpatient treatment and medications as below Encourage ongoing group , milieu participation to work on coping skills and symptom reduction Encourage ongoing efforts /focus on recovery, relapse prevention  Continue the Atripla for management of HIV infection. Continue Paxil 40 mg daily for depression, anxiety Continue the Ibuprofen PRNs for pain, as needed. Continue Protonix for GERD, reflux symptoms. Continue Vistaril 25 mg mg for insomnia, initiated Mirtazapine 7.5 mg Q hs.  Continue the Crestor 20 mg for high cholesterol, Lisinopril 10 mg HTN, Norvacs 10 mg for HTN, ASA 81 mg for heart health, Coreg 25 mg for  HTN. Treatment team and [place] working on disposition planning Will continue current plan of care.   Lindell Spar I, NP [facility] 10/01/2015, 2:09 PM  Agree with NP Progress Note, as above

## 2015-10-01 NOTE — Progress Notes (Signed)
Recreation Therapy Notes  Date: 07.14.2017 Time: 9:30am Location: 300 Hall Group Room   Group Topic: Stress Management  Goal Area(s) Addresses:  Patient will actively participate in stress management techniques presented during session.   Behavioral Response: Did not attend.   Laureen Ochs Nahdia Doucet, LRT/CTRS        Lane Hacker 10/01/2015 5:52 PM

## 2015-10-01 NOTE — Tx Team (Signed)
Interdisciplinary Treatment Plan Update (Adult)  Date:  10/01/2015  Time Reviewed:  9:30am  Progress in Treatment: Attending groups: Intermittently Participating in groups: Yes- when he attends Taking medication as prescribed:  Yes. Tolerating medication:  Yes. Family/Significant othe contact made:  SPE completed with pt; pt declined to consent to family contact.  Patient understands diagnosis:  Yes. and As evidenced by:  seeking treatment for SI, depression/mood instability, cocaine abuse, and for medication stabilization. Discussing patient identified problems/goals with staff:  Yes. Medical problems stabilized or resolved:  Yes. Denies suicidal/homicidal ideation: Yes. Issues/concerns per patient self-inventory:  Other:  Discharge Plan or Barriers:  7/5: CSW assessing. Pt was last admitted to [facility] 09/07/15 and 08/17/15 for similar issues and was referred to [place] for medication management with Nathan Santana (NP) and therapy with Nathan Santana (Counselor).   7/10: Patient interested in long-term residential care. CSW and patient exploring options including [facility].  7/14: [facility] on [facility] at [facility]am. He plans to [facility]. [facility].   Reason for Continuation of Hospitalization: Medication management   Comments:  Nathan Santana is a 55 y.o. male presenting for depression and substance abuse.  At that time, pt denied SI. Pt was given resources from [place] and advised to keep his appt with [facility] next week. Pt now presented at [facility] for the same concerns. Pt wants IP hospitalization. Pt denied SI to the triage RN, but told the PA that he was suicidal with a plan to OD. Writer spoke to pt about this and he indicated that he thought that Nathan Santana understood that he was suicidal b/c he was depressed. Diagnosis: Cocaine induced depressive disorder; MDD, recurrent episode,  severe  Estimated length of stay:  2 days (pt plans to discharge Monday at 7:20AM for 8am screening at [place] residential).   Additional Comments:  Patient and CSW reviewed pt's identified goals and treatment plan. Patient verbalized understanding and agreed to treatment plan. CSW reviewed [facility] "Discharge Process and Patient Involvement" Form. Pt verbalized understanding of information provided and signed form.    Review of initial/current patient goals per problem list:  1. Goal(s): Patient will participate in aftercare plan  Met: Yes  Target date: at discharge  As evidenced by: Patient will participate within aftercare plan AEB aftercare provider and housing plan at discharge being identified.  09/22/15: CSW assessing for appropriate referrals. During admission last month, pt was referred to [place] for o/p mental health needs.  7/10: Goal not met: CSW assessing for appropriate referrals for pt and will have follow up secured prior to d/c. 7/14: Goal met. Pt has screening at [facility] on Monday at 8am. Infectious disease appt scheduled. [facility] information provided. [facility] provided-pt declined.    2. Goal (s): Patient will exhibit decreased depressive symptoms and suicidal ideations.  Met: Yes   Target date: at discharge  As evidenced by: Patient will utilize self rating of depression at 3 or below and demonstrate decreased signs of depression or be deemed stable for discharge by MD.  09/22/15: Pt rates depression as high. Reports SI "on and off" but currently denies. Pt denies HI/AVH.  7/10: Patient rates depression at 8, denies SI.  7/14: Pt rates depression as 3/10 and presents with pleasant mood/lethargic affect.   3. Goal(s): Patient will demonstrate decreased signs of withdrawal due to substance abuse  Met:Yes   Target date:at discharge  As evidenced by: Patient will produce a  CIWA/COWS score of 0, have stable vitals signs, and no symptoms of withdrawal.  7/5: Pt reports minimal withdrawals with COWS of 1 and high BP.  7/10: Goal met. No withdrawal symptoms reported at this time per medical chart.   Attendees: Patient:    Family:    Physician: Dr. Elenora Santana 10/01/2015 10:06 AM   Nursing: Nathan Gilding RN 10/01/2015 10:06 AM   Clinical Social Worker: Nathan Downer Drinkard, LCSW 10/01/2015 10:06 AM   Other: Nathan Santana, LCSWA 10/01/2015 10:06 AM   Other: Nathan Better, LCSW 10/01/2015 10:07 AM   Other:    Other: Nathan Antony Salmon, NP 10/01/2015 10:06 AM   Other:    Other:         Nathan Santana, MSW, LCSW Clinical Social Worker 10/01/2015 10:06 AM

## 2015-10-01 NOTE — BHH Group Notes (Signed)
[facility] LCSW Aftercare Discharge Planning Group Note   10/01/2015 9:31 AM  Participation Quality:  Invited. Chose to remain in bed. DID NOT ATTEND.   Santana, Nathan Record LCSW

## 2015-10-01 NOTE — Progress Notes (Signed)
Nursing Note 09/30/15 1900 - 10/01/15 0730  Data Patient being monitored inpatient for cocaine substance abuse related mood disorder and SI.  Denied active SI, HI, and AVH and stated he could come to staff if this changed.  Reported depression and racing thoughts.  Attended [place] group.  Denied physical complaints.  Appropriate and polite with staff and peers, very talkative during assessment.  Action Spoke with patient 1:1, offered to be a support for duration of shift..  Remained on 15 minute checks for safety.  Response Patient appears to be asleep.  Remains safe on unit.

## 2015-10-01 NOTE — Progress Notes (Signed)
Patient did not attend the evening speaker AA meeting. Pt was notified that group was beginning but remained in bed.   

## 2015-10-01 NOTE — BHH Group Notes (Signed)
[facility] LCSW Group Therapy  10/01/2015 12:46 PM  Type of Therapy:  Group Therapy  Participation Level:  Did Not Attend-pt invited. Sleeping in room. Did not come to group.   Modes of Intervention:  Confrontation, Discussion, Education, Exploration, Problem-solving, Rapport Building, Socialization and Support  Summary of Progress/Problems: Feelings around Relapse. Group members discussed the meaning of relapse and shared personal stories of relapse, how it affected them and others, and how they perceived themselves during this time. Group members were encouraged to identify triggers, warning signs and coping skills used when facing the possibility of relapse. Social supports were discussed and explored in detail. Post Acute Withdrawal Syndrome (handout provided) was introduced and examined. Pt's were encouraged to ask questions, talk about key points associated with PAWS, and process this information in terms of relapse prevention.   Smart, Oshae Simmering LCSW 10/01/2015, 12:46 PM

## 2015-10-02 MED ORDER — MIRTAZAPINE 15 MG PO TABS
15.0000 mg | ORAL_TABLET | Freq: Every day | ORAL | Status: DC
  Administered 2015-10-02 – 2015-10-03 (×2): 15 mg via ORAL
  Filled 2015-10-02 (×3): qty 1

## 2015-10-02 NOTE — BHH Group Notes (Signed)
  [facility] LCSW Group Therapy Note  10/02/2015 at 10:00 AM  Type of Therapy and Topic:  Group Therapy: Avoiding Self-Sabotaging and Enabling Behaviors  Participation Level:  Active  Participation Quality:  Attentive and Sharing  Affect:  Serious  Cognitive:  Appropriate  Insight:  Developing/Improving  Engagement in Therapy:  Developing/Improving   Therapeutic models used: Cognitive Behavioral Therapy,  Person-Centered Therapy and Motivational Interviewing  Modes of Intervention:  Discussion, Exploration, Orientation, Rapport Building, Socialization and Support   Summary of Progress/Problems:  The main focus of today's process group was for the patient to identify ways in which they have in the past sabotaged their own recovery. Motivational Interviewing was utilized to identify motivation they may have for wanting to change. Pt shared his thoughts as he processed the subject of change and what that really means to him. Patient was attentive throughout group session.    Sheilah Pigeon, LCSW

## 2015-10-02 NOTE — Progress Notes (Signed)
D: Patient's self inventory sheet: patient has poor sleep, recieved sleep medication with no relief.fair  Appetite, low energy level, unsure about  concentration. Rated depression 6/10, hopeless 6/10, anxiety 6/10. SI/HI/AVH: reports intermittent thoughts of suicide. Physical complaints are pain in left toe and pt feels that his pain is not being addressed adequately. Goal is "me and what's most important for me".Unsure what he plans to work on.   A: Medications administered, assessed medication knowledge and education given on medication regimen.  Emotional support and encouragement given patient. R: Endorses SI , contracts for safety. Safety maintained with 15 minute checks.

## 2015-10-02 NOTE — Progress Notes (Signed)
Patient has been isolative to his room and did not attend AA this evening. He came to the dayroom for snack and stayed briefly before he returned to his room. He was compliant with his meds and c/o pain in his left pinky toe and received motrin. He currently reports that he needs to work on himself and stop trying to please other adult people and made reference to his sister. Support given and safety maintained on unit with 15 min checks.

## 2015-10-02 NOTE — Progress Notes (Signed)
Patient did attend the evening speaker AA meeting.  

## 2015-10-02 NOTE — Progress Notes (Signed)
[facility] MD Progress Note  10/02/2015 12:56 PM Nathan Santana  MRN:  [id number]   Subjective: Nathan Santana seen,agitated  and frustrated with staff. Patient reports " my toe hurts."   Objective: Nathan Santana is awake, alert and oriented X4. Seen resting in bedroom. Denies suicidal or homicidal ideation. Denies auditory or visual hallucination and does not appear to be responding to internal stimuli. Patient reports the interaction with staff and others has been difficult during this stay. Patient reports he is medication compliant without mediation side effects. States his depression 8/10. Patient states "I am having pain and no one cares" Reports good appetite and is not resting well throughout the night. Support, encouragement and reassurance was provided.   Principal Problem: Substance or medication-induced depressive disorder with onset during intoxication [facility])  Diagnosis:   Patient Active Problem List   Diagnosis Date Noted  . Cocaine use disorder, severe, dependence ([place]) [F14.20] 09/22/2015  . Substance or medication-induced depressive disorder with onset during intoxication ([place]) [F19.94] 09/22/2015  . Cannabis use disorder, moderate, dependence ([place]) [F12.20] 09/22/2015  . MDD (major depressive disorder), recurrent severe, without psychosis ([place]) [F33.2] 09/21/2015  . Hyperlipidemia LDL goal <130 [E78.5] 03/01/2015  . Orchalgia [N50.819]   . Testicular pain, right [N50.811]   . HIV (human immunodeficiency virus infection) ([place]) [Z21] 02/05/2015  . CAD (coronary artery disease) [I25.10] 02/05/2015  . Chest pain [R07.9] 02/05/2015  . Suicidal ideations [R45.851] 02/05/2015  . Hypertension [I10] 02/05/2015  . Anxiety [F41.9]   . Pain in the chest [R07.9]   . Depression [F32.9]   . Suicidal ideation [R45.851]    Total Time spent with patient: 15 minutes  Past Medical History:  Past Medical History  Diagnosis Date  . HIV infection ([place])   . Cancer ([place])   . Hypertension   .  Angina pectoris ([place])   . Scrotal cyst   . Anxiety   . Depression   . Hypercholesteremia   . Myocardial infarct, old     Past Surgical History  Procedure Laterality Date  . Scrotal turmor removed     Family History:  Family History  Problem Relation Age of Onset  . Hypertension Other   . CAD Sister     Social History:  History  Alcohol Use No    Comment: one beer weekly     History  Drug Use  . Yes  . Special: Marijuana, Cocaine    Comment: Cocaine    Social History   Social History  . Marital Status: Single    Spouse Name: N/A  . Number of Children: 4  . Years of Education: N/A   Social History Main Topics  . Smoking status: Current Some Day Smoker -- 0.50 packs/day    Types: Cigars, Cigarettes  . Smokeless tobacco: None  . Alcohol Use: No     Comment: one beer weekly  . Drug Use: Yes    Special: Marijuana, Cocaine     Comment: Cocaine  . Sexual Activity: No   Other Topics Concern  . None   Social History Narrative   Additional Social History:   Sleep: Fair   Appetite:  improved   Current Medications: Current Facility-Administered Medications  Medication Dose Route Frequency Provider Last Rate Last Dose  . acetaminophen (TYLENOL) tablet 650 mg  650 mg Oral Q6H PRN Benjamine Mola, FNP   650 mg at 10/01/15 0410  . alum & mag hydroxide-simeth (MAALOX/MYLANTA) 200-200-20 MG/5ML suspension 30 mL  30 mL Oral Q4H PRN Elyse Jarvis  [place], FNP      . amLODipine (NORVASC) tablet 10 mg  10 mg Oral Daily Benjamine Mola, FNP   10 mg at 10/02/15 0859  . aspirin EC tablet 81 mg  81 mg Oral Daily Benjamine Mola, FNP   81 mg at 10/02/15 0859  . carvedilol (COREG) tablet 25 mg  25 mg Oral BID WC Benjamine Mola, FNP   25 mg at 10/02/15 0859  . efavirenz-emtricitabine-tenofovir (ATRIPLA) 600-200-300 MG per tablet 1 tablet  1 tablet Oral QHS Benjamine Mola, FNP   1 tablet at 10/01/15 2230  . hydrOXYzine (ATARAX/VISTARIL) tablet 25 mg  25 mg Oral QHS PRN [name], MD    25 mg at 09/29/15 2305  . ibuprofen (ADVIL,MOTRIN) tablet 600 mg  600 mg Oral Q6H PRN Encarnacion Slates, NP   600 mg at 10/01/15 2231  . lisinopril (PRINIVIL,ZESTRIL) tablet 10 mg  10 mg Oral Daily Benjamine Mola, FNP   10 mg at 10/02/15 0859  . LORazepam (ATIVAN) tablet 1 mg  1 mg Oral Q4H PRN Ursula Alert, MD   1 mg at 09/25/15 2212   Or  . LORazepam (ATIVAN) injection 1 mg  1 mg Intramuscular Q4H PRN Saramma Eappen, MD      . magnesium hydroxide (MILK OF MAGNESIA) suspension 30 mL  30 mL Oral Daily PRN Benjamine Mola, FNP      . mirtazapine (REMERON) tablet 15 mg  15 mg Oral QHS Derrill Center, NP      . nicotine polacrilex (NICORETTE) gum 2 mg  2 mg Oral PRN Benjamine Mola, FNP      . nitroGLYCERIN (NITROSTAT) SL tablet 0.4 mg  0.4 mg Sublingual Q5 min PRN Jenne Campus, MD   0.4 mg at 09/29/15 0847  . pantoprazole (PROTONIX) EC tablet 40 mg  40 mg Oral Daily Benjamine Mola, FNP   40 mg at 10/02/15 0859  . PARoxetine (PAXIL) tablet 40 mg  40 mg Oral Daily [name], MD   40 mg at 10/02/15 0859  . rosuvastatin (CRESTOR) tablet 20 mg  20 mg Oral Daily Benjamine Mola, FNP   20 mg at 10/02/15 [id number]   Lab Results:  No results found for this or any previous visit (from the past 48 hour(s)).  Blood Alcohol level:  Lab Results  Component Value Date   ETH <5 09/20/2015   ETH <5 09/06/2015   Physical Findings: AIMS: Facial and Oral Movements Muscles of Facial Expression: None, normal Lips and Perioral Area: None, normal Jaw: None, normal Tongue: None, normal,Extremity Movements Upper (arms, wrists, hands, fingers): None, normal Lower (legs, knees, ankles, toes): None, normal, Trunk Movements Neck, shoulders, hips: None, normal, Overall Severity Severity of abnormal movements (highest score from questions above): None, normal Incapacitation due to abnormal movements: None, normal Patient's awareness of abnormal movements (rate only patient's report): No Awareness, Dental  Status Current problems with teeth and/or dentures?: Yes Does patient usually wear dentures?: No  CIWA:  CIWA-Ar Total: 2 COWS:  COWS Total Score: 1  Musculoskeletal: Strength & Muscle Tone: within normal limits Gait & Station: normal Patient leans: N/A  Psychiatric Specialty Exam: Physical Exam  Nursing note and vitals reviewed. Constitutional: He is oriented to person, place, and time. He appears well-developed.  Musculoskeletal: Normal range of motion.  Neurological: He is alert and oriented to person, place, and time.  Psychiatric: He has a normal mood and affect. His behavior is normal.  Review of Systems  Psychiatric/Behavioral: Positive for depression and substance abuse. Negative for suicidal ideas, hallucinations and memory loss. The patient is nervous/anxious. The patient does not have insomnia.   All other systems reviewed and are negative.  No chest pain at present , no shortness of breath at room air, no nausea, no vomiting, describes some ankle pain today  Blood pressure 139/78, pulse 71, temperature 98.7 F (37.1 C), temperature source Oral, resp. rate 16, height 5\' 9"  (1.753 m), weight 83.689 kg (184 lb 8 oz), SpO2 100 %.Body mass index is 27.23 kg/(m^2).  General Appearance: Casual and Fairly Groomed  Eye Contact:  Good  Speech:  Normal Rate  Volume:  Normal  Mood:  Reports some improvement although still feels depressed   Affect:  More reactive   Thought Process:  Linear  Orientation:  Full (Time, Place, and Person)  Thought Content:  Denies any hallucinations, delusions or paranoia.  Suicidal Thoughts:  No at this time denies any suicidal ideations, denies any self injurious ideations, and contracts for safety on the unit   Homicidal Thoughts:  No  Denies any homicidal ideations or violent ideations   Memory:  recent and remote grossly intact   Judgement:  Improving   Insight:  Improving   Psychomotor Activity:  Normal  Concentration:  Concentration: Good  and Attention Span: Good  Recall:  Good  Fund of Knowledge:  Good  Language:  Good  Akathisia:  Negative  Handed:  Right  AIMS (if indicated):     Assets:  Communication Skills Desire for Improvement Resilience  ADL's:  Intact  Cognition:  WNL  Sleep:  Number of Hours: 5    I agree with current treatment plan on 10/02/2015, Patient seen face-to-face for psychiatric evaluation follow-up, chart reviewed. Reviewed the information documented and agree with the treatment plan.  Treatment Plan Summary: Daily contact with patient to assess and evaluate symptoms and progress in treatment, Medication management, Plan inpatient treatment and medications as below   Encourage ongoing group , milieu participation to work on coping skills and symptom reduction Encourage ongoing efforts /focus on recovery, relapse prevention  Continue the Atripla for management of HIV infection. Continue Paxil 40 mg daily for depression, anxiety Continue the Ibuprofen PRNs for pain, as needed. Continue Protonix for GERD, reflux symptoms. Continue Vistaril 25 mg mg for vistaril  Increase Mirtazapine 7.5 mg to 10 mg  Q hs for insomina Continue the Crestor 20 mg for high cholesterol, Lisinopril 10 mg HTN, Norvacs 10 mg for HTN, ASA 81 mg for heart health, Coreg 25 mg for  HTN. Treatment team and [place] working on disposition planning Will continue current plan of care.   Derrill Center, NP  10/02/2015, 12:56 PM    Agree with NP Progress Note, as above

## 2015-10-03 MED ORDER — NICOTINE POLACRILEX 2 MG MT GUM: 2.0000 mg | CHEWING_GUM | OROMUCOSAL | Status: AC | PRN

## 2015-10-03 MED ORDER — AMLODIPINE BESYLATE 10 MG PO TABS: 10.0000 mg | ORAL_TABLET | Freq: Every day | ORAL | Status: DC

## 2015-10-03 MED ORDER — CARVEDILOL 25 MG PO TABS: 25.0000 mg | ORAL_TABLET | Freq: Two times a day (BID) | ORAL | Status: DC

## 2015-10-03 MED ORDER — PANTOPRAZOLE SODIUM 40 MG PO TBEC: 40.0000 mg | DELAYED_RELEASE_TABLET | Freq: Every day | ORAL | Status: DC

## 2015-10-03 MED ORDER — NITROGLYCERIN 0.4 MG SL SUBL: 0.4000 mg | SUBLINGUAL_TABLET | SUBLINGUAL | Status: DC | PRN

## 2015-10-03 MED ORDER — EFAVIRENZ-EMTRICITAB-TENOFOVIR 600-200-300 MG PO TABS: 1.0000 | ORAL_TABLET | Freq: Every day | ORAL | Status: DC

## 2015-10-03 MED ORDER — MIRTAZAPINE 15 MG PO TABS: 15.0000 mg | ORAL_TABLET | Freq: Every day | ORAL | Status: AC

## 2015-10-03 MED ORDER — PAROXETINE HCL 40 MG PO TABS: 40.0000 mg | ORAL_TABLET | Freq: Every day | ORAL | Status: DC

## 2015-10-03 MED ORDER — ROSUVASTATIN CALCIUM 20 MG PO TABS: 20.0000 mg | ORAL_TABLET | Freq: Every day | ORAL | Status: AC

## 2015-10-03 MED ORDER — LISINOPRIL 10 MG PO TABS: 10.0000 mg | ORAL_TABLET | Freq: Every day | ORAL | Status: DC

## 2015-10-03 NOTE — BHH Suicide Risk Assessment (Signed)
[facility] Discharge Suicide Risk Assessment   Principal Problem: Substance or medication-induced depressive disorder with onset during intoxication [facility]) Discharge Diagnoses:  Patient Active Problem List   Diagnosis Date Noted  . Cocaine use disorder, severe, dependence ([place]) [F14.20] 09/22/2015  . Substance or medication-induced depressive disorder with onset during intoxication ([place]) [F19.94] 09/22/2015  . Cannabis use disorder, moderate, dependence ([place]) [F12.20] 09/22/2015  . MDD (major depressive disorder), recurrent severe, without psychosis ([place]) [F33.2] 09/21/2015  . Hyperlipidemia LDL goal <130 [E78.5] 03/01/2015  . Orchalgia [N50.819]   . Testicular pain, right [N50.811]   . HIV (human immunodeficiency virus infection) ([place]) [Z21] 02/05/2015  . CAD (coronary artery disease) [I25.10] 02/05/2015  . Chest pain [R07.9] 02/05/2015  . Suicidal ideations [R45.851] 02/05/2015  . Hypertension [I10] 02/05/2015  . Anxiety [F41.9]   . Pain in the chest [R07.9]   . Depression [F32.9]   . Suicidal ideation [R45.851]     Total Time spent with patient: 30 minutes  Musculoskeletal: Strength & Muscle Tone: within normal limits Gait & Station: normal Patient leans: N/A  Psychiatric Specialty Exam: ROSdenies headache, no chest pain, no shortness of breath, no vomiting , no rash   Blood pressure 121/91, pulse 69, temperature 98.5 F (36.9 C), temperature source Oral, resp. rate 16, height 5\' 9"  (1.753 m), weight 184 lb 8 oz (83.689 kg), SpO2 100 %.Body mass index is 27.23 kg/(m^2).  General Appearance: Well Groomed  Eye Contact::  Good  Speech:  Normal Rate409  Volume:  Normal  Mood:  states he still feels depressed, but he states his mood is improved, better than it was and he feels he has made progress   Affect:  Appropriate and reactive   Thought Process:  Linear  Orientation:  Full (Time, Place, and Person)  Thought Content:  denies hallucinations, no delusions , not internally  preoccupied   Suicidal Thoughts:  No denies any suicidal ideations, denies any current self injurious ideations, also denies any violent or homicidal ideations   Homicidal Thoughts:  No  Memory:  recent and remote grossly intact  Judgement:  Other:  improving   Insight:  improving   Psychomotor Activity:  Normal  Concentration:  Good  Recall:  Good  Fund of Knowledge:Good  Language: Negative  Akathisia:  No  Handed:  Right  AIMS (if indicated):     Assets:  Communication Skills Desire for Improvement Resilience  Sleep:  Number of Hours: 5  Cognition: WNL  ADL's:  Intact   Mental Status Per Nursing Assessment::   On Admission:     Demographic Factors:  55 year old man, currently unemployed, homeless  Loss Factors: Homelessness, limited support network   Historical Factors: History of depression, history of prior psychiatric admissions, history of cocaine and cannabis dependencies   Risk Reduction Factors:   Sense of responsibility to family and Positive coping skills or problem solving skills  Continued Clinical Symptoms:  At this time patient is alert, attentive, well related, mood improving compared to admission and states he is feeling better, affect less constricted, smiles at times appropriately, no thought disorder, no SI or HI at this time, no hallucinations, no delusions, future oriented and states that after completing [facility] he wants to go to a long term residential setting if possible . Denies medication side effects  Cognitive Features That Contribute To Risk:  No gross cognitive deficits noted upon discharge. Is alert , attentive, and oriented x 3   Suicide Risk:  Mild:  Suicidal ideation of limited  frequency, intensity, duration, and specificity.  There are no identifiable plans, no associated intent, mild dysphoria and related symptoms, good self-control (both objective and subjective assessment), few other risk factors, and identifiable protective factors,  including available and accessible social support.  Follow-up Information    Follow up with [facility].   Why:  Appt on this date at 8:00AM for screening.    Contact information:   [address] [place], [place] [address] Phone: [phone] Fax: [phone]      Follow up with [facility] On 10/25/2015.   Why:  Appt on this date at 2:30PM for Financial Counseling. Appt at 3:00PM to be seen by Dr. Megan Salon for medication management.    Contact information:   ATTN: Dr. Michel Bickers [address] [place], [place] [address] Phone: [phone] Fax: [phone]      Follow up with [facility].   Why:  Walk in between 8am-9am Monday through Friday for hospital follow-up/medication management/assessment for counseling services.    Contact information:   [address] [place] [address] Phone: [phone] Fax: [phone]      Plan Of Care/Follow-up recommendations:  Activity:  as tolerated  Diet:  regular  Tests:  NA Other:  see below   Patient is scheduled to go to [facility] early tomorrow morning , and states he is optimistic about this disposition plan Plans to continue management at [facility], MD 10/03/2015, 3:38 PM

## 2015-10-03 NOTE — Progress Notes (Signed)
Patient did attend the evening speaker AA meeting.  

## 2015-10-03 NOTE — Discharge Summary (Signed)
Physician Discharge Summary Note  Patient:  Nathan Santana is an 55 y.o., male MRN:  [id number] DOB:  August 26, 1960 Patient phone:  [phone] (home)  Patient address:   [place] [address],  Total Time spent with patient: 45 minutes  Date of Admission:  09/21/2015 Date of Discharge: 10/04/2015  Reason for Admission:Per HPI- [name] is a 55 y.o. male patient admitted with reports of suicidal ideation along with cocaine and THC abuse. Pt was recently discharged from [facility] and then returned to [facility] with a plan to OD. Pt known to our treatment team from other consults. Pt's primary triggers are losing his house in August of 2016 along with family strain. Pt seen and chart reviewed. Pt is alert/oriented x4, calm, cooperative, and appropriate to situation. Pt denies homicidal ideation and psychosis and does not appear to be responding to internal stimuli.   Principal Problem: Substance or medication-induced depressive disorder with onset during intoxication [facility]) Discharge Diagnoses: Patient Active Problem List   Diagnosis Date Noted  . Cocaine use disorder, severe, dependence ([place]) [F14.20] 09/22/2015  . Substance or medication-induced depressive disorder with onset during intoxication ([place]) [F19.94] 09/22/2015  . Cannabis use disorder, moderate, dependence ([place]) [F12.20] 09/22/2015  . MDD (major depressive disorder), recurrent severe, without psychosis ([place]) [F33.2] 09/21/2015  . Hyperlipidemia LDL goal <130 [E78.5] 03/01/2015  . Orchalgia [N50.819]   . Testicular pain, right [N50.811]   . HIV (human immunodeficiency virus infection) ([place]) [Z21] 02/05/2015  . CAD (coronary artery disease) [I25.10] 02/05/2015  . Chest pain [R07.9] 02/05/2015  . Suicidal ideations [R45.851] 02/05/2015  . Hypertension [I10] 02/05/2015  . Anxiety [F41.9]   . Pain in the chest [R07.9]   . Depression [F32.9]   . Suicidal ideation [R45.851]     Past Psychiatric History: See Above  Past  Medical History:  Past Medical History  Diagnosis Date  . HIV infection ([place])   . Cancer ([place])   . Hypertension   . Angina pectoris ([place])   . Scrotal cyst   . Anxiety   . Depression   . Hypercholesteremia   . Myocardial infarct, old     Past Surgical History  Procedure Laterality Date  . Scrotal turmor removed     Family History:  Family History  Problem Relation Age of Onset  . Hypertension Other   . CAD Sister    Family Psychiatric  History: See H&P Social History:  History  Alcohol Use No    Comment: one beer weekly     History  Drug Use  . Yes  . Special: Marijuana, Cocaine    Comment: Cocaine    Social History   Social History  . Marital Status: Single    Spouse Name: N/A  . Number of Children: 4  . Years of Education: N/A   Social History Main Topics  . Smoking status: Current Some Day Smoker -- 0.50 packs/day    Types: Cigars, Cigarettes  . Smokeless tobacco: None  . Alcohol Use: No     Comment: one beer weekly  . Drug Use: Yes    Special: Marijuana, Cocaine     Comment: Cocaine  . Sexual Activity: No   Other Topics Concern  . None   Social History Narrative    Hospital Course:  Quintan Picardo was admitted for Substance or medication-induced depressive disorder with onset during intoxication [facility])  and crisis management.  Pt was treated discharged with the medications listed below under Medication List.  Medical problems were identified and  treated as needed.  Home medications were restarted as appropriate. Improvement was monitored by observation and Trevor Iha 's daily report of symptom reduction.  Emotional and mental status was monitored by daily self-inventory reports completed by Trevor Iha and clinical staff.         Jaspreet Abair was evaluated by the treatment team for stability and plans for continued recovery upon discharge. Thomas Buitrago 's motivation was an integral factor for scheduling further treatment.  Employment, transportation, bed availability, health status, family support, and any pending legal issues were also considered during hospital stay. Pt was offered further treatment options upon discharge including but not limited to Residential, Intensive Outpatient, and Outpatient treatment.  Cairo Beaston will follow up with the services as listed below under Follow Up Information.     Upon completion of this admission the patient was both mentally and medically stable for discharge denying suicidal/homicidal ideation, auditory/visual/tactile hallucinations, delusional thoughts and paranoia.    Trevor Iha responded well to treatment with Paxil and ambien  without adverse effects.  Pt demonstrated improvement without reported or observed adverse effects to the point of stability appropriate for outpatient management. Pertinent labs include: BMP; elevated glucose and decreased potassium for which outpatient follow-up is necessary for lab recheck as mentioned below. Reviewed CBC, CMP, BAL, and UDS+ cocaine; all unremarkable aside from noted exceptions.  Physical Findings: AIMS: Facial and Oral Movements Muscles of Facial Expression: None, normal Lips and Perioral Area: None, normal Jaw: None, normal Tongue: None, normal,Extremity Movements Upper (arms, wrists, hands, fingers): None, normal Lower (legs, knees, ankles, toes): None, normal, Trunk Movements Neck, shoulders, hips: None, normal, Overall Severity Severity of abnormal movements (highest score from questions above): None, normal Incapacitation due to abnormal movements: None, normal Patient's awareness of abnormal movements (rate only patient's report): No Awareness, Dental Status Current problems with teeth and/or dentures?: Yes Does patient usually wear dentures?: No  CIWA:  CIWA-Ar Total: 2 COWS:  COWS Total Score: 1  Musculoskeletal: Strength & Muscle Tone: within normal limits Gait & Station: normal Patient leans:  N/A  Psychiatric Specialty Exam: Physical Exam  Vitals reviewed. Constitutional: He is oriented to person, place, and time. He appears well-developed.  Cardiovascular: Normal rate.   Musculoskeletal: Normal range of motion.  Neurological: He is alert and oriented to person, place, and time.  Psychiatric: He has a normal mood and affect. His behavior is normal.    Review of Systems  Psychiatric/Behavioral: Negative for suicidal ideas and hallucinations. Depression: stable. Nervous/anxious: stable.   All other systems reviewed and are negative.   Blood pressure 121/91, pulse 69, temperature 98.5 F (36.9 C), temperature source Oral, resp. rate 16, height 5\' 9"  (1.753 m), weight 83.689 kg (184 lb 8 oz), SpO2 100 %.Body mass index is 27.23 kg/(m^2).   Have you used any form of tobacco in the last 30 days? (Cigarettes, Smokeless Tobacco, Cigars, and/or Pipes): Yes  Has this patient used any form of tobacco in the last 30 days? (Cigarettes, Smokeless Tobacco, Cigars, and/or Pipes), No  Blood Alcohol level:  Lab Results  Component Value Date   ETH <5 09/20/2015   ETH <5 [id number]    Metabolic Disorder Labs:  Lab Results  Component Value Date   HGBA1C 5.3 02/06/2015   MPG 105 02/06/2015   MPG 105 01/30/2015   No results found for: PROLACTIN Lab Results  Component Value Date   CHOL 156 03/01/2015   TRIG 60 03/01/2015   HDL 51 03/01/2015   CHOLHDL 3.1  03/01/2015   VLDL 12 03/01/2015   LDLCALC 93 03/01/2015   [place] 95 02/06/2015    See Psychiatric Specialty Exam and Suicide Risk Assessment completed by Attending Physician prior to discharge.  Discharge destination:  Other:  [facility] treatment program  Is patient on multiple antipsychotic therapies at discharge:  No   Has Patient had three or more failed trials of antipsychotic monotherapy by history:  No  Recommended Plan for Multiple Antipsychotic Therapies: NA  Discharge Instructions    Activity as tolerated -  No restrictions    Complete by:  As directed      Diet - low sodium heart healthy    Complete by:  As directed      Discharge instructions    Complete by:  As directed   Take all medications as prescribed. Keep all follow-up appointments as scheduled.  Do not consume alcohol or use illegal drugs while on prescription medications. Report any adverse effects from your medications to your primary care provider promptly.  In the event of recurrent symptoms or worsening symptoms, call 911, a crisis hotline, or go to the nearest emergency department for evaluation.            Medication List    STOP taking these medications        zolpidem 5 MG tablet  Commonly known as:  AMBIEN      TAKE these medications      Indication   amLODipine 10 MG tablet  Commonly known as:  NORVASC  Take 1 tablet (10 mg total) by mouth daily.   Indication:  High Blood Pressure     aspirin EC 81 MG tablet  Take 1 tablet (81 mg total) by mouth daily.   Indication:  cardiac health     carvedilol 25 MG tablet  Commonly known as:  COREG  Take 1 tablet (25 mg total) by mouth 2 (two) times daily with a meal.   Indication:  High Blood Pressure of Unknown Cause     efavirenz-emtricitabine-tenofovir 600-200-300 MG tablet  Commonly known as:  ATRIPLA  Take 1 tablet by mouth at bedtime.   Indication:  HIV Disease     lisinopril 10 MG tablet  Commonly known as:  PRINIVIL,ZESTRIL  Take 1 tablet (10 mg total) by mouth daily.   Indication:  High Blood Pressure     mirtazapine 15 MG tablet  Commonly known as:  REMERON  Take 1 tablet (15 mg total) by mouth at bedtime.   Indication:  Trouble Sleeping     nicotine polacrilex 2 MG gum  Commonly known as:  NICORETTE  Take 1 each (2 mg total) by mouth as needed for smoking cessation.   Indication:  Nicotine Addiction     nitroGLYCERIN 0.4 MG SL tablet  Commonly known as:  NITROSTAT  Place 1 tablet (0.4 mg total) under the tongue every 5 (five) minutes as  needed for chest pain.      pantoprazole 40 MG tablet  Commonly known as:  PROTONIX  Take 1 tablet (40 mg total) by mouth daily.   Indication:  Gastroesophageal Reflux Disease     PARoxetine 40 MG tablet  Commonly known as:  PAXIL  Take 1 tablet (40 mg total) by mouth daily.   Indication:  Generalized Anxiety Disorder, Major Depressive Disorder     rosuvastatin 20 MG tablet  Commonly known as:  CRESTOR  Take 1 tablet (20 mg total) by mouth daily.   Indication:  Disease of the  Heart and Blood Vessels           Follow-up Information    Follow up with [facility].   Why:  Appt on this date at 8:00AM for screening.    Contact information:   [address] [place], [place] [address] Phone: [phone] Fax: [phone]      Follow up with [facility] On 10/25/2015.   Why:  Appt on this date at 2:30PM for Financial Counseling. Appt at 3:00PM to be seen by Dr. Megan Salon for medication management.    Contact information:   ATTN: Dr. Michel Bickers [address] [place], [place] [address] Phone: [phone] Fax: [phone]      Follow up with [facility].   Why:  Walk in between 8am-9am Monday through Friday for hospital follow-up/medication management/assessment for counseling services.    Contact information:   [address] [place] [address] Phone: [phone] Fax: [phone]      Follow-up recommendations:  Activity:  as tolerated Diet:  heart healthy as recom by primary care provider  Comments: Take all medications as prescribed. Keep all follow-up appointments as scheduled.  Do not consume alcohol or use illegal drugs while on prescription medications. Report any adverse effects from your medications to your primary care provider promptly.  In the event of recurrent symptoms or worsening symptoms, call 911, a crisis hotline, or go to the nearest emergency department for evaluation.   Signed: Derrill Center, NP 10/03/2015,  3:41 PM

## 2015-10-03 NOTE — BHH Group Notes (Signed)
[place] LCSW Group Therapy  10/03/2015 10:10 to 11:05 AM  Type of Therapy:  Group Therapy  Participation Level:  Active  Participation Quality:  Attentive and Sharing  Affect:  Appropriate  Cognitive:  Appropriate  Insight:  Developing/Improving  Engagement in Therapy:  Developing/Improving  Modes of Intervention:  Activity, Discussion, Exploration, Rapport Building, Socialization and Support  Summary of Progress/Problems: Topic for today was thoughts and feelings regarding discharge. We discussed fears of upcoming changes including judegements, expectations and stigma of mental health issues. We then discussed supports: what constitutes a supportive framework. As patients processed their anxiety about discharge and described healthy supports patient came in somewhat l;ate to group yet easily joined in.  Patient chose a visual to represent decompensation as people sleeping in public as he has so often done. Patient processed his feelings of feeling despised and ignored by his family and was willing to admit that he has similar looks and qualities of his father who killed his mother.  Patient processed the time is now to focus on himself and chose visuals for improvement as freedom and self care.   Nathan [place], LCSW

## 2015-10-03 NOTE — Progress Notes (Addendum)
D: Patient refused to complete self inventory sheet, difficult to awaken for AM medications. C/O pain in his toe and received Ibuprofen. Refused to attend group. Isolating in his room, sleeping. A: Medications administered, assessed medication knowledge and education given on medication regimen.  Emotional support and encouragement given patient. R: Denies SI and HI , contracts for safety. Safety maintained with 15 minute checks.    Addendum, completed inventory sheet before lunch, reported fair sleep, fair appetite, low energy. Depression/anxiety/hopelessness 7/10. Reports continued intermittent passive SI. Concerned about discharge disposition as he has "no income and no place to go". Increased interaction in the afternoon.

## 2015-10-03 NOTE — Progress Notes (Signed)
[facility] MD Progress Note  10/03/2015 10:53 AM Nathan Santana  MRN:  [id number]   Subjective: Nathan Santana reports. " I am sorry, I am feeling better than yesterday."   Objective: Nathan Santana is awake, alert and oriented X4. Seen resting in bedroom. Patient still endorsing pain however states the pain is getting better.5/10.  Denies suicidal or homicidal ideation. Denies auditory or visual hallucination and does not appear to be responding to internal stimuli. Patient reports the interaction with staff and others has been difficult during this stay. Patient reports he is medication compliant without mediation side effects. States his depression 8/10. Patient states he will try to attend group sessions today. Reports good appetite and resting well throughout the night with the medication adjustment. Support, encouragement and reassurance was provided.   Principal Problem: Substance or medication-induced depressive disorder with onset during intoxication [facility])  Diagnosis:   Patient Active Problem List   Diagnosis Date Noted  . Cocaine use disorder, severe, dependence ([place]) [F14.20] 09/22/2015  . Substance or medication-induced depressive disorder with onset during intoxication ([place]) [F19.94] 09/22/2015  . Cannabis use disorder, moderate, dependence ([place]) [F12.20] 09/22/2015  . MDD (major depressive disorder), recurrent severe, without psychosis ([place]) [F33.2] 09/21/2015  . Hyperlipidemia LDL goal <130 [E78.5] 03/01/2015  . Orchalgia [N50.819]   . Testicular pain, right [N50.811]   . HIV (human immunodeficiency virus infection) ([place]) [Z21] 02/05/2015  . CAD (coronary artery disease) [I25.10] 02/05/2015  . Chest pain [R07.9] 02/05/2015  . Suicidal ideations [R45.851] 02/05/2015  . Hypertension [I10] 02/05/2015  . Anxiety [F41.9]   . Pain in the chest [R07.9]   . Depression [F32.9]   . Suicidal ideation [R45.851]    Total Time spent with patient: 20 minutes  Past Medical History:  Past  Medical History  Diagnosis Date  . HIV infection ([place])   . Cancer ([place])   . Hypertension   . Angina pectoris ([place])   . Scrotal cyst   . Anxiety   . Depression   . Hypercholesteremia   . Myocardial infarct, old     Past Surgical History  Procedure Laterality Date  . Scrotal turmor removed     Family History:  Family History  Problem Relation Age of Onset  . Hypertension Other   . CAD Sister     Social History:  History  Alcohol Use No    Comment: one beer weekly     History  Drug Use  . Yes  . Special: Marijuana, Cocaine    Comment: Cocaine    Social History   Social History  . Marital Status: Single    Spouse Name: N/A  . Number of Children: 4  . Years of Education: N/A   Social History Main Topics  . Smoking status: Current Some Day Smoker -- 0.50 packs/day    Types: Cigars, Cigarettes  . Smokeless tobacco: None  . Alcohol Use: No     Comment: one beer weekly  . Drug Use: Yes    Special: Marijuana, Cocaine     Comment: Cocaine  . Sexual Activity: No   Other Topics Concern  . None   Social History Narrative   Additional Social History:   Sleep: Fair   Appetite:  improved   Current Medications: Current Facility-Administered Medications  Medication Dose Route Frequency Provider Last Rate Last Dose  . acetaminophen (TYLENOL) tablet 650 mg  650 mg Oral Q6H PRN Benjamine Mola, FNP   650 mg at 10/02/15 2159  . alum & mag hydroxide-simeth (MAALOX/MYLANTA)  200-200-20 MG/5ML suspension 30 mL  30 mL Oral Q4H PRN Benjamine Mola, FNP      . amLODipine (NORVASC) tablet 10 mg  10 mg Oral Daily Benjamine Mola, FNP   10 mg at 10/03/15 0839  . aspirin EC tablet 81 mg  81 mg Oral Daily Benjamine Mola, FNP   81 mg at 10/03/15 [id number]  . carvedilol (COREG) tablet 25 mg  25 mg Oral BID WC Benjamine Mola, FNP   25 mg at 10/03/15 0839  . efavirenz-emtricitabine-tenofovir (ATRIPLA) 600-200-300 MG per tablet 1 tablet  1 tablet Oral QHS Benjamine Mola, FNP   1 tablet at  10/02/15 2156  . hydrOXYzine (ATARAX/VISTARIL) tablet 25 mg  25 mg Oral QHS PRN [name], MD   25 mg at 09/29/15 2305  . ibuprofen (ADVIL,MOTRIN) tablet 600 mg  600 mg Oral Q6H PRN Encarnacion Slates, NP   600 mg at 10/03/15 0839  . lisinopril (PRINIVIL,ZESTRIL) tablet 10 mg  10 mg Oral Daily Benjamine Mola, FNP   10 mg at 10/03/15 0839  . LORazepam (ATIVAN) tablet 1 mg  1 mg Oral Q4H PRN Ursula Alert, MD   1 mg at 09/25/15 2212   Or  . LORazepam (ATIVAN) injection 1 mg  1 mg Intramuscular Q4H PRN Saramma Eappen, MD      . magnesium hydroxide (MILK OF MAGNESIA) suspension 30 mL  30 mL Oral Daily PRN Benjamine Mola, FNP      . mirtazapine (REMERON) tablet 15 mg  15 mg Oral QHS Derrill Center, NP   15 mg at 10/02/15 2156  . nicotine polacrilex (NICORETTE) gum 2 mg  2 mg Oral PRN Benjamine Mola, FNP      . nitroGLYCERIN (NITROSTAT) SL tablet 0.4 mg  0.4 mg Sublingual Q5 min PRN Jenne Campus, MD   0.4 mg at 09/29/15 0847  . pantoprazole (PROTONIX) EC tablet 40 mg  40 mg Oral Daily Benjamine Mola, FNP   40 mg at 10/03/15 0839  . PARoxetine (PAXIL) tablet 40 mg  40 mg Oral Daily [name], MD   40 mg at 10/03/15 0839  . rosuvastatin (CRESTOR) tablet 20 mg  20 mg Oral Daily Benjamine Mola, FNP   20 mg at 10/03/15 [id number]   Lab Results:  No results found for this or any previous visit (from the past 48 hour(s)).  Blood Alcohol level:  Lab Results  Component Value Date   ETH <5 09/20/2015   ETH <5 09/06/2015   Physical Findings: AIMS: Facial and Oral Movements Muscles of Facial Expression: None, normal Lips and Perioral Area: None, normal Jaw: None, normal Tongue: None, normal,Extremity Movements Upper (arms, wrists, hands, fingers): None, normal Lower (legs, knees, ankles, toes): None, normal, Trunk Movements Neck, shoulders, hips: None, normal, Overall Severity Severity of abnormal movements (highest score from questions above): None, normal Incapacitation due to abnormal movements:  None, normal Patient's awareness of abnormal movements (rate only patient's report): No Awareness, Dental Status Current problems with teeth and/or dentures?: Yes Does patient usually wear dentures?: No  CIWA:  CIWA-Ar Total: 2 COWS:  COWS Total Score: 1  Musculoskeletal: Strength & Muscle Tone: within normal limits Gait & Station: normal Patient leans: N/A  Psychiatric Specialty Exam: Physical Exam  Nursing note and vitals reviewed. Constitutional: He is oriented to person, place, and time. He appears well-developed.  Musculoskeletal: Normal range of motion.  Neurological: He is alert and oriented to person,  place, and time.  Psychiatric: He has a normal mood and affect. His behavior is normal.    Review of Systems  Psychiatric/Behavioral: Positive for depression and substance abuse. Negative for suicidal ideas, hallucinations and memory loss. The patient is nervous/anxious. The patient does not have insomnia.   All other systems reviewed and are negative.  No chest pain at present , no shortness of breath at room air, no nausea, no vomiting, describes some ankle pain today  Blood pressure 121/91, pulse 69, temperature 98.5 F (36.9 C), temperature source Oral, resp. rate 16, height 5\' 9"  (1.753 m), weight 83.689 kg (184 lb 8 oz), SpO2 100 %.Body mass index is 27.23 kg/(m^2).  General Appearance: Fairly Groomed  Eye Contact:  Good  Speech:  Normal Rate  Volume:  Normal  Mood:   still feels depressed  Affect:  More reactive   Thought Process:  Linear  Orientation:  Full (Time, Place, and Person)  Thought Content:  Denies any hallucinations, delusions or paranoia.  Suicidal Thoughts:  No at this time denies any suicidal ideations, denies any self injurious ideations, and contracts for safety on the unit   Homicidal Thoughts:  No  Denies any homicidal ideations or violent ideations   Memory:  recent and remote grossly intact   Judgement:  Improving   Insight:  Improving    Psychomotor Activity:  Normal  Concentration:  Concentration: Good and Attention Span: Good  Recall:  Good  Fund of Knowledge:  Good  Language:  Good  Akathisia:  Negative  Handed:  Right  AIMS (if indicated):     Assets:  Communication Skills Desire for Improvement Resilience  ADL's:  Intact  Cognition:  WNL  Sleep:  Number of Hours: 5    I agree with current treatment plan on 10/03/2015, Patient seen face-to-face for psychiatric evaluation follow-up, chart reviewed. Reviewed the information documented and agree with the treatment plan.  Treatment Plan Summary: Daily contact with patient to assess and evaluate symptoms and progress in treatment, Medication management, Plan inpatient treatment and medications as below  Encourage ongoing group , milieu participation to work on coping skills and symptom reduction Encourage ongoing efforts /focus on recovery, relapse prevention  Continue the Atripla for management of HIV infection. Continue Paxil 40 mg daily for depression, anxiety Continue the Ibuprofen PRNs for pain, as needed. Continue Protonix for GERD, reflux symptoms. Continue Vistaril 25 mg mg for vistaril  Continue Mirtazapine 10 mg mg  Q HS for insomnia Continue the Crestor 20 mg for high cholesterol, Lisinopril 10 mg HTN, Norvacs 10 mg for HTN, ASA 81 mg for heart health, Coreg 25 mg for  HTN. Treatment team and Syl[place] working on disposition planning Will continue current plan of care.   Derrill Center, NP  10/03/2015, 10:53 AM  Agree with NP Progress Note as above

## 2015-10-03 NOTE — BHH Group Notes (Signed)
[facility] Group Notes:  (Nursing/MHT/Case Management/Adjunct)  Date:  10/03/2015  Time:  11:14 AM  Type of Therapy:  Psychoeducational Skills  Participation Level:  Did Not Attend  Participation Quality:  REFUSED  Affect:  REFUSED  Cognitive:  REFUSED  Insight:  None  Engagement in Group:  None  Modes of Intervention:  Discussion and did not attend  Summary of Progress/Problems: patient slept in room did not attend  Royston Cowper Hasna Stefanik 10/03/2015, 11:14 AM

## 2015-10-03 NOTE — Progress Notes (Signed)
Writer spoe with patient 1:1 and he appears to be in a more pleasant mood tonight. He talked about all of his losses over the years and where he is currently with the loss of his home. Writer provided emotional support and actively listened. He reported feeling frustrated with some of the other patients b/c they don't seem serious about their treatment. Writer reminded him to focus on himself while here. Support givne and safety maintained on unit with 15 min checks.

## 2015-10-04 NOTE — Progress Notes (Signed)
Patient was discharged to lobby and cab picked up at 7 am to take him to [facility]. He was given his belongings from locker 16. Patient signed and was given all discharge paperwork, (AVS, SRA and Transition record). He has a copy of his suicide safety plan. He denies si/hi/a/v hallucinations and was looking forward to going to [facility] and work on getting his life together.

## 2015-10-04 NOTE — Progress Notes (Signed)
Writer discussed discharge paperwork and medications he would be leaving with going to [facility] in the am. He has been pleasant this evening and opened up with writer about his current situation and to how he came back here shortly after his last discharge. Writer encouraged him to follow through with his plans. Safety maintained on unit with 15 min checks.

## 2015-10-25 ENCOUNTER — Ambulatory Visit (INDEPENDENT_AMBULATORY_CARE_PROVIDER_SITE_OTHER): Payer: Medicare Other | Admitting: Internal Medicine

## 2015-10-25 ENCOUNTER — Encounter: Payer: Self-pay | Admitting: Internal Medicine

## 2015-10-25 ENCOUNTER — Ambulatory Visit: Payer: Medicare Other | Admitting: *Deleted

## 2015-10-25 DIAGNOSIS — F32A Depression, unspecified: Secondary | ICD-10-CM

## 2015-10-25 DIAGNOSIS — B20 Human immunodeficiency virus [HIV] disease: Secondary | ICD-10-CM

## 2015-10-25 DIAGNOSIS — Z21 Asymptomatic human immunodeficiency virus [HIV] infection status: Secondary | ICD-10-CM | POA: Diagnosis not present

## 2015-10-25 DIAGNOSIS — F329 Major depressive disorder, single episode, unspecified: Secondary | ICD-10-CM

## 2015-10-25 DIAGNOSIS — F1420 Cocaine dependence, uncomplicated: Secondary | ICD-10-CM | POA: Diagnosis not present

## 2015-10-25 DIAGNOSIS — Z720 Tobacco use: Secondary | ICD-10-CM | POA: Diagnosis present

## 2015-10-25 DIAGNOSIS — F17210 Nicotine dependence, cigarettes, uncomplicated: Secondary | ICD-10-CM | POA: Insufficient documentation

## 2015-10-25 NOTE — BH Specialist Note (Signed)
Counselor met with Nathan Santana today in the exam room for a warm hand off per Dr. Megan Salon request.  Patient was oriented times four with good affect and dress.  Patient was alert and very talkative. Patient shared that he relapsed a month ago on cocaine as a result of feeling lonely after a break up with a woman he had a long term relationship with and was planning a wedding with. Patient stated that this break up left him broken hearted and feeling really alone. Counselor explained about the counseling services that was offered to him.  Counselor encouraged him to make an appointment to further discuss and develop new coping skills.  Patient agreed and made an appointment for next week.   Rolena Infante, MA, LPC Alcohol [facility] Drug Services/[facility]

## 2015-10-25 NOTE — Progress Notes (Signed)
Patient Active Problem List   Diagnosis Date Noted  . HIV (human immunodeficiency virus infection) ([place]) 02/05/2015    Priority: High  . Cigarette smoker 10/25/2015  . Cocaine use disorder, severe, dependence ([place]) 09/22/2015  . Cannabis use disorder, moderate, dependence ([place]) 09/22/2015  . MDD (major depressive disorder), recurrent severe, without psychosis ([place]) 09/21/2015  . Hyperlipidemia LDL goal <130 03/01/2015  . Orchalgia   . Testicular pain, right   . CAD (coronary artery disease) 02/05/2015  . Chest pain 02/05/2015  . Suicidal ideations 02/05/2015  . Hypertension 02/05/2015  . Anxiety   . Pain in the chest   . Depression   . Suicidal ideation     Patient's Medications  New Prescriptions   No medications on file  Previous Medications   AMLODIPINE (NORVASC) 10 MG TABLET    Take 1 tablet (10 mg total) by mouth daily.   ASPIRIN EC 81 MG TABLET    Take 1 tablet (81 mg total) by mouth daily.   CARVEDILOL (COREG) 25 MG TABLET    Take 1 tablet (25 mg total) by mouth 2 (two) times daily with a meal.   EFAVIRENZ-EMTRICITABINE-TENOFOVIR (ATRIPLA) 600-200-300 MG TABLET    Take 1 tablet by mouth at bedtime.   LISINOPRIL (PRINIVIL,ZESTRIL) 10 MG TABLET    Take 1 tablet (10 mg total) by mouth daily.   MIRTAZAPINE (REMERON) 15 MG TABLET    Take 1 tablet (15 mg total) by mouth at bedtime.   NICOTINE POLACRILEX (NICORETTE) 2 MG GUM    Take 1 each (2 mg total) by mouth as needed for smoking cessation.   NITROGLYCERIN (NITROSTAT) 0.4 MG SL TABLET    Place 1 tablet (0.4 mg total) under the tongue every 5 (five) minutes as needed for chest pain.   PANTOPRAZOLE (PROTONIX) 40 MG TABLET    Take 1 tablet (40 mg total) by mouth daily.   PAROXETINE (PAXIL) 40 MG TABLET    Take 1 tablet (40 mg total) by mouth daily.   ROSUVASTATIN (CRESTOR) 20 MG TABLET    Take 1 tablet (20 mg total) by mouth daily.  Modified Medications   No medications on file  Discontinued Medications   No  medications on file    Subjective: Nathan Santana is in for his initial HIV visit to establish ongoing care. He was diagnosed in 2004 when he went to donate plasma. He does not recall being tested prior to that time. He believes he was infected from a former male partner who he later learned was in injecting drug user. He entered into care in [place] shortly after his diagnosis. He does not recall the name of the clinic. He started on Combivir and Sustiva. He states he's never had any trouble taking his medications. He later moved to [place] and was being seen by Dr. Jessie Foot at [facility]. She simplified his regimen to Atripla which he continues to take. He moved to [place] last year after a 10 year relationship with another woman ended. She broke the relationship off after he did not want to get married. He became depressed and relapsed with crack cocaine, marijuana and alcohol use. He has had several admissions to [facility], most recently about one month ago. He is now been sober for a little over one month and is in [facility] counseling treatment. He had been off of her Atripla for several months before his most recent admission. He restarted Atripla and has  not missed any doses over the past month.  Since learning of his infection he shared that information with his family and several close friends. He feels like he has reasonably good support. He has 4 daughters and 2 sons ranging in age from 33-49 years old.  Review of Systems: Review of Systems  Constitutional: Negative for chills, diaphoresis, fever, malaise/fatigue and weight loss.  HENT: Negative for sore throat.        He has lost his partial plate dentures.  Eyes:       He states that he needs glasses for distance vision.  Respiratory: Negative for cough, sputum production and shortness of breath.   Cardiovascular: Negative for chest pain.  Gastrointestinal: Negative for abdominal pain, diarrhea, heartburn,  nausea and vomiting.  Genitourinary: Negative for dysuria and frequency.  Musculoskeletal: Negative for joint pain and myalgias.  Skin: Negative for rash.  Neurological: Negative for dizziness, focal weakness and headaches.  Psychiatric/Behavioral: Positive for substance abuse. Negative for depression. The patient is not nervous/anxious.     Past Medical History:  Diagnosis Date  . Angina pectoris ([place])   . Anxiety   . Cancer ([place])   . Depression   . HIV infection ([place])   . Hypercholesteremia   . Hypertension   . Myocardial infarct, old   . Scrotal cyst     Social History  Substance Use Topics  . Smoking status: Current Some Day Smoker    Packs/day: 0.50    Types: Cigars, Cigarettes  . Smokeless tobacco: Never Used  . Alcohol use No     Comment: one beer weekly    Family History  Problem Relation Age of Onset  . Hypertension Other   . CAD Sister     No Known Allergies  Objective:  Vitals:   10/25/15 1450  BP: (!) 159/82  Pulse: 77  Temp: 97.7 F (36.5 C)  TempSrc: Oral  Weight: 213 lb (96.6 kg)   Body mass index is 31.45 kg/m.  Physical Exam  Constitutional: He is oriented to person, place, and time.  He is smiling and in good spirits.  HENT:  Mouth/Throat: No oropharyngeal exudate.  He is missing several teeth.  Eyes: Conjunctivae are normal.  Cardiovascular: Normal rate and regular rhythm.   No murmur heard. Pulmonary/Chest: Effort normal and breath sounds normal. He has no wheezes. He has no rales.  Abdominal: Soft. He exhibits no mass. There is no tenderness.  Musculoskeletal: Normal range of motion. He exhibits no edema or tenderness.  Neurological: He is alert and oriented to person, place, and time.  Skin: No rash noted.  Psychiatric: Mood and affect normal.    Lab Results Lab Results  Component Value Date   WBC 5.8 09/20/2015   HGB 13.3 09/20/2015   HCT 39.6 09/20/2015   MCV 93.6 09/20/2015   PLT 168 09/20/2015    Lab Results    Component Value Date   CREATININE 0.93 09/23/2015   BUN 16 09/23/2015   NA 139 09/23/2015   K 3.6 09/23/2015   CL 106 09/23/2015   CO2 29 09/23/2015    Lab Results  Component Value Date   ALT 53 09/20/2015   AST 52 (H) 09/20/2015   ALKPHOS 92 09/20/2015   BILITOT 0.8 09/20/2015    Lab Results  Component Value Date   CHOL 156 03/01/2015   HDL 51 03/01/2015   LDLCALC 93 03/01/2015   TRIG 60 03/01/2015   CHOLHDL 3.1 03/01/2015   HIV 1 RNA Quant (  copies/mL)  Date Value  08/16/2015 39,100   CD4 T Cell Abs (/uL)  Date Value  08/16/2015 330 (L)  02/08/2015 450  02/06/2015 TEST REQUEST RECEIVED WITHOUT APPROPRIATE SPECIMEN     Problem List Items Addressed This Visit      High   HIV (human immunodeficiency virus infection) ([place])    His viral load was elevated at nearly 40,000 just before his recent readmission. I will recheck his lab work today and see him back in several weeks to consider changing to a newer TAF regimen or Triumeq. I will see him back in one week.      Relevant Orders   T-helper cell (CD4)- ([facility] clinic only)   HIV 1 RNA quant-no reflex-bld   HLA B*[id number]     Unprioritized   Cigarette smoker   Cocaine use disorder, severe, dependence ([place])    He is currently sober. I had him meet with Rolena Infante our [place] counselor today. I also talked him about our support groups.      Depression    He denies depression currently.       Other Visit Diagnoses   None.       Michel Bickers, MD [facility] for Infectious [facility] [phone] pager   [phone] cell 10/25/2015, 3:41 PM

## 2015-10-25 NOTE — Assessment & Plan Note (Signed)
He denies depression currently.

## 2015-10-25 NOTE — Assessment & Plan Note (Addendum)
His viral load was elevated at nearly 40,000 just before his recent readmission. I will recheck his lab work today and see him back in several weeks to consider changing to a newer TAF regimen or Triumeq. I will see him back in one week.

## 2015-10-25 NOTE — Assessment & Plan Note (Signed)
He is currently sober. I had him meet with Rolena Infante our [place] counselor today. I also talked him about our support groups.

## 2015-10-26 LAB — T-HELPER CELL (CD4) - (RCID CLINIC ONLY)
CD4 T CELL ABS: 400 /uL (ref 400–2700)
CD4 T CELL HELPER: 25 % — AB (ref 33–55)

## 2015-10-26 LAB — HIV-1 RNA QUANT-NO REFLEX-BLD
HIV 1 RNA QUANT: 405 {copies}/mL — AB (ref <20)
HIV-1 RNA Quant, Log: 2.61 Log copies/mL — ABNORMAL HIGH (ref <1.30)

## 2015-11-01 ENCOUNTER — Ambulatory Visit (INDEPENDENT_AMBULATORY_CARE_PROVIDER_SITE_OTHER): Payer: Medicare Other | Admitting: Internal Medicine

## 2015-11-01 ENCOUNTER — Encounter: Payer: Self-pay | Admitting: Internal Medicine

## 2015-11-01 ENCOUNTER — Ambulatory Visit: Payer: Medicare Other | Admitting: *Deleted

## 2015-11-01 VITALS — BP 176/100 | HR 72 | Temp 97.7°F | Wt 220.0 lb

## 2015-11-01 DIAGNOSIS — Z79899 Other long term (current) drug therapy: Secondary | ICD-10-CM

## 2015-11-01 DIAGNOSIS — Z21 Asymptomatic human immunodeficiency virus [HIV] infection status: Secondary | ICD-10-CM

## 2015-11-01 DIAGNOSIS — F329 Major depressive disorder, single episode, unspecified: Secondary | ICD-10-CM | POA: Diagnosis not present

## 2015-11-01 DIAGNOSIS — E785 Hyperlipidemia, unspecified: Secondary | ICD-10-CM

## 2015-11-01 DIAGNOSIS — F32A Depression, unspecified: Secondary | ICD-10-CM

## 2015-11-01 DIAGNOSIS — A630 Anogenital (venereal) warts: Secondary | ICD-10-CM

## 2015-11-01 DIAGNOSIS — F1910 Other psychoactive substance abuse, uncomplicated: Secondary | ICD-10-CM

## 2015-11-01 DIAGNOSIS — I10 Essential (primary) hypertension: Secondary | ICD-10-CM

## 2015-11-01 DIAGNOSIS — B20 Human immunodeficiency virus [HIV] disease: Secondary | ICD-10-CM

## 2015-11-01 NOTE — Assessment & Plan Note (Signed)
His depression has resolved with sobriety and continued antidepressant therapy.

## 2015-11-01 NOTE — BH Specialist Note (Signed)
Pratt was present today for his scheduled appointment.  Patient was oriented times four with good affect and dress.  Patient was alert and talkative.  Patient shared that he is in good spirits today and says that he has been empowering himself and feeling better about the breakup.  Counselor used reflective listening skills as patient shared.  Patient stated that he has been clean a for awhile now and has not been tempted to use at all.  Patient did say that he feels he needs to get out and about in order to meet some new people and find friends that are decent to hang out with some to help with the boredom and loneliness. Counselor provided support and encouragement for patient.  Counselor recommended that patient look up the meet up groups in this area in order to find some activities with people doing the type of things he enjoys doing.  Patient expressed gratitude for counselor for helping to realize that he has a lot of life left and needs to make the most of it.  Patient was invited by counselor to attend [facility] support group.  Patient said he would definitely consider it.   Rolena Infante, MA, LPC Alcohol and Drug Services/[facility]

## 2015-11-01 NOTE — Assessment & Plan Note (Signed)
His lipids were at goal at last December. I will order a lipid panel before his visit in 3 months.

## 2015-11-01 NOTE — Assessment & Plan Note (Signed)
His blood pressure is above goal today. He is working on finding a new primary care provider.

## 2015-11-01 NOTE — Assessment & Plan Note (Signed)
His viral load is coming down since restarting Atripla. He is interested in switching to one of our newer, safer 1 pill a day regimens. I would like to avoid Genvoya because of potential drug drug interactions between Genvoya, Paxil and Remeron. I will avoid Odefsey because he is on Protonix. If his HLA B5701 is negative I will change him to Triumeq. He states that I can call [facility] [phone]) and leave a message for him to return the call. He will follow-up after lab work in 3 months.

## 2015-11-01 NOTE — Progress Notes (Signed)
Patient Active Problem List   Diagnosis Date Noted  . HIV (human immunodeficiency virus infection) ([place]) 02/05/2015    Priority: High  . 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 Cigarette smoker 10/25/2015  . Cocaine use disorder, severe, dependence ([place]) 09/22/2015  . Cannabis use disorder, moderate, dependence ([place]) 09/22/2015  . MDD (major depressive disorder), recurrent severe, without psychosis ([place]) 09/21/2015  . Hyperlipidemia LDL goal <130 03/01/2015  . Orchalgia   . Testicular pain, right   . CAD (coronary artery disease) 02/05/2015  . Hypertension 02/05/2015  . Anxiety   . Depression   . Suicidal ideation     Patient's Medications  New Prescriptions   No medications on file  Previous Medications   AMLODIPINE (NORVASC) 10 MG TABLET    Take 1 tablet (10 mg total) by mouth daily.   ASPIRIN EC 81 MG TABLET    Take 1 tablet (81 mg total) by mouth daily.   CARVEDILOL (COREG) 25 MG TABLET    Take 1 tablet (25 mg total) by mouth 2 (two) times daily with a meal.   EFAVIRENZ-EMTRICITABINE-TENOFOVIR (ATRIPLA) 600-200-300 MG TABLET    Take 1 tablet by mouth at bedtime.   LISINOPRIL (PRINIVIL,ZESTRIL) 10 MG TABLET    Take 1 tablet (10 mg total) by mouth daily.   MIRTAZAPINE (REMERON) 15 MG TABLET    Take 1 tablet (15 mg total) by mouth at bedtime.   NICOTINE POLACRILEX (NICORETTE) 2 MG GUM    Take 1 each (2 mg total) by mouth as needed for smoking cessation.   NITROGLYCERIN (NITROSTAT) 0.4 MG SL TABLET    Place 1 tablet (0.4 mg total) under the tongue every 5 (five) minutes as needed for chest pain.   PANTOPRAZOLE (PROTONIX) 40 MG TABLET    Take 1 tablet (40 mg total) by mouth daily.   PAROXETINE (PAXIL) 40 MG TABLET    Take 1 tablet (40 mg total) by mouth daily.   ROSUVASTATIN (CRESTOR) 20 MG TABLET    Take 1 tablet (20 mg total) by mouth daily.  Modified Medications   No medications on file  Discontinued Medications   No medications on file    Subjective: Nathan Santana is in for  his routine HIV follow-up visit. He has not missed any doses of his Atripla. He remains in [facility] and he has reviewing applications for longer-term drug treatment programs. He is no longer feeling depressed.  Review of Systems: Review of Systems  Constitutional: Negative for chills, diaphoresis, fever, malaise/fatigue and weight loss.  HENT: Negative for sore throat.   Respiratory: Negative for cough, sputum production and shortness of breath.   Cardiovascular: Negative for chest pain.  Gastrointestinal: Negative for abdominal pain, diarrhea, nausea and vomiting.  Genitourinary: Negative for dysuria.  Musculoskeletal: Negative for joint pain and myalgias.  Skin: Negative for rash.  Neurological: Negative for dizziness and headaches.  Psychiatric/Behavioral: Negative for depression and substance abuse. The patient is not nervous/anxious.     Past Medical History:  Diagnosis Date  . Angina pectoris ([place])   . Anxiety   . Cancer ([place])   . Depression   . HIV infection ([place])   . Hypercholesteremia   . Hypertension   . Myocardial infarct, old   . Scrotal cyst     Social History  Substance Use Topics  . Smoking status: Current Some Day Smoker    Packs/day: 0.50    Types: Cigars, Cigarettes  . Smokeless tobacco: Never Used  .  Alcohol use No     Comment: one beer weekly    Family History  Problem Relation Age of Onset  . Hypertension Other   . CAD Sister     No Known Allergies  Objective:  Vitals:   11/01/15 1615  BP: (!) 176/100  Pulse: 72  Temp: 97.7 F (36.5 C)  TempSrc: Oral  Weight: 220 lb (99.8 kg)   Body mass index is 32.49 kg/m.  Physical Exam  Constitutional: He is oriented to person, place, and time.  He is in good spirits.  HENT:  Mouth/Throat: No oropharyngeal exudate.  Multiple missing teeth.  Eyes: Conjunctivae are normal.  Cardiovascular: Normal rate and regular rhythm.   No murmur heard. Pulmonary/Chest: Effort normal and breath sounds  normal. He has no wheezes. He has no rales.  Abdominal: Soft. He exhibits no mass. There is no tenderness.  Musculoskeletal: Normal range of motion. He exhibits no edema or tenderness.  Neurological: He is alert and oriented to person, place, and time.  Skin: No rash noted.  Psychiatric: Mood and affect normal.    Lab Results Lab Results  Component Value Date   WBC 5.8 09/20/2015   HGB 13.3 09/20/2015   HCT 39.6 09/20/2015   MCV 93.6 09/20/2015   PLT 168 09/20/2015    Lab Results  Component Value Date   CREATININE 0.93 09/23/2015   BUN 16 09/23/2015   NA 139 09/23/2015   K 3.6 09/23/2015   CL 106 09/23/2015   CO2 29 09/23/2015    Lab Results  Component Value Date   ALT 53 09/20/2015   AST 52 (H) 09/20/2015   ALKPHOS 92 09/20/2015   BILITOT 0.8 09/20/2015    Lab Results  Component Value Date   CHOL 156 03/01/2015   HDL 51 03/01/2015   LDLCALC 93 03/01/2015   TRIG 60 03/01/2015   CHOLHDL 3.1 03/01/2015   HIV 1 RNA Quant (copies/mL)  Date Value  10/25/2015 405 (H)  08/16/2015 39,100   CD4 T Cell Abs (/uL)  Date Value  10/25/2015 400  08/16/2015 330 (L)  02/08/2015 450     Problem List Items Addressed This Visit      High   HIV (human immunodeficiency virus infection) ([place])    His viral load is coming down since restarting Atripla. He is interested in switching to one of our newer, safer 1 pill a day regimens. I would like to avoid Genvoya because of potential drug drug interactions between Genvoya, Paxil and Remeron. I will avoid Odefsey because he is on Protonix. If his HLA B5701 is negative I will change him to Triumeq. He states that I can call [facility] [phone]) and leave a message for him to return the call. He will follow-up after lab work in 3 months.      Relevant Orders   T-helper cell (CD4)- ([facility] clinic only)   HIV 1 RNA quant-no reflex-bld     Unprioritized   Anal condyloma   Depression    His depression has resolved with sobriety and  continued antidepressant therapy.      Hyperlipidemia LDL goal <130    His lipids were at goal at last December. I will order a lipid panel before his visit in 3 months.      Relevant Orders   Lipid panel   Hypertension    His blood pressure is above goal today. He is working on finding a new primary care provider.       Other  Visit Diagnoses    Encounter for long-term (current) use of medications    -  Primary   Relevant Orders   Lipid panel        Michel Bickers, MD [facility] for [place] [phone] pager   [phone] cell 11/01/2015, 4:48 PM

## 2015-11-03 LAB — HLA B*5701: HLA-B 5701 W/RFLX HLA-B HIGH: POSITIVE — AB

## 2015-11-12 ENCOUNTER — Ambulatory Visit: Payer: Self-pay | Admitting: *Deleted

## 2015-11-23 ENCOUNTER — Encounter: Payer: Self-pay | Admitting: Licensed Clinical Social Worker

## 2015-12-13 ENCOUNTER — Telehealth: Payer: Self-pay | Admitting: Pharmacist

## 2015-12-13 NOTE — Telephone Encounter (Signed)
Trying to get a hold of Nathan Santana so he can come in and we can change his medications per Dr. Hale Bogus instructions. Called the number we have on file [phone]) and number is no longer in service.  Called his pharmacy [facility]) and asked for number on file. The number they gave me is [phone]. Called that number and it belonged to a lady named [name]. Left generic voicemail asking her to call me back.  Also saw in Dr. Hale Bogus last office visit note that we could call [name] and leave a message.  Called [name] and left message with Network engineer for him to call me back. Will schedule patient to come in and see me for medication change when I can get a hold of him.   Judythe Postema L. Donnajean Lopes, PharmD Infectious [place] for Infectious Disease 12/13/2015, 10:57 AM

## 2015-12-14 ENCOUNTER — Telehealth: Payer: Self-pay | Admitting: Pharmacist

## 2015-12-14 NOTE — Telephone Encounter (Signed)
Received a call back from [place]. Will bring him in on Thursday around 10:30 to stop the Atripla and change to Tivicay + Descovy. Will let Dr. Megan Salon know.  Tarquin Welcher L. Donnajean Lopes, PharmD Infectious [place] for Infectious Disease 12/14/2015, 2:16 PM

## 2015-12-15 ENCOUNTER — Ambulatory Visit: Payer: Medicare Other | Admitting: Pharmacist

## 2015-12-15 DIAGNOSIS — B20 Human immunodeficiency virus [HIV] disease: Secondary | ICD-10-CM

## 2015-12-15 MED ORDER — DOLUTEGRAVIR SODIUM 50 MG PO TABS: 50.0000 mg | ORAL_TABLET | Freq: Every day | ORAL | 3 refills | Status: DC

## 2015-12-15 MED ORDER — EMTRICITABINE-TENOFOVIR AF 200-25 MG PO TABS: 1.0000 | ORAL_TABLET | Freq: Every day | ORAL | 3 refills | Status: DC

## 2015-12-15 NOTE — Progress Notes (Signed)
HPI: Nathan Santana is a 55 y.o. male who presents to the [place] today for follow-up of his HIV and a medication change.  He has been suffering from depression for quite some time, so we will switch him off of Atripla.  Allergies: No Known Allergies  Past Medical History: Past Medical History:  Diagnosis Date  . Angina pectoris ([place])   . Anxiety   . Cancer ([place])   . Depression   . HIV infection ([place])   . Hypercholesteremia   . Hypertension   . Myocardial infarct, old   . Scrotal cyst     Social History: Social History   Social History  . Marital status: Single    Spouse name: N/A  . Number of children: 4  . Years of education: N/A   Social History Main Topics  . Smoking status: Current Some Day Smoker    Packs/day: 0.50    Types: Cigars, Cigarettes  . Smokeless tobacco: Never Used  . Alcohol use No     Comment: one beer weekly  . Drug use:     Types: Marijuana, Cocaine     Comment: Cocaine  . Sexual activity: No   Other Topics Concern  . Not on file   Social History Narrative  . No narrative on file    Current Regimen: Atripla  Labs: HIV 1 RNA Quant (copies/mL)  Date Value  10/25/2015 405 (H)  08/16/2015 39,100   CD4 T Cell Abs (/uL)  Date Value  10/25/2015 400  08/16/2015 330 (L)  02/08/2015 450    CrCl: CrCl cannot be calculated (Unknown ideal weight.).  Lipids:    Component Value Date/Time   CHOL 156 03/01/2015 1550   TRIG 60 03/01/2015 1550   HDL 51 03/01/2015 1550   CHOLHDL 3.1 03/01/2015 1550   VLDL 12 03/01/2015 1550   LDLCALC 93 03/01/2015 1550    Assessment: Nathan Santana is here today for a medication change. He has been taking Atripla for quite some time.  He is eager and willing to change.  He would like a one pill once daily regimen.  We cannot do Genvoya as he is on remeron and there is a drug interaction, we cannot do Odefsey because he is on protonix, and we cannot due Triumeq because he is HLA positive. For now, we  will change him to [place].  He does think his depression is getting better.  He thinks that due to a recent environment change and switching off of the Atripla will help his depression.  He is interested in trying to come off the Paxil and Remeron. I explained to him that the Atripla could very well be contributing to his depression and nightmares. I discussed with him the possibility of weaning him off of the Paxil if he is interested in that. I instructed him not to quit cold [place] and that these medications need to be titrated down. We made a plan to change him to the [place] for now and return to see me in about 3 weeks to a month and discuss titrating him off of the Paxil and Remeron.  If he is able to come off of those medications, he would like to try Genvoya. He is agreeable to the plan. He does say the best way to get ahold of him is to call [facility] [phone]. He also said we can ask for Nathan Santana who is his case Freight forwarder.   Plans: - Stop Atripla - Start Tivicay 50 mg  PO daily - Start Descovy 200-25 mg PO daily - Continue Paxil and Remeron for now - F/u with [facility] pharmacist 01/12/16 10am  Cassie L. Donnajean Lopes, PharmD Infectious [place] for Infectious Disease 12/15/2015, 11:57 AM

## 2015-12-15 NOTE — Telephone Encounter (Signed)
We have discussed this change to a safer regimen. I agree with this plan.

## 2015-12-16 ENCOUNTER — Ambulatory Visit: Payer: Self-pay

## 2016-01-12 ENCOUNTER — Ambulatory Visit: Payer: Medicare Other | Admitting: Pharmacist

## 2016-01-12 DIAGNOSIS — E785 Hyperlipidemia, unspecified: Secondary | ICD-10-CM

## 2016-01-12 DIAGNOSIS — Z79899 Other long term (current) drug therapy: Secondary | ICD-10-CM

## 2016-01-12 DIAGNOSIS — B20 Human immunodeficiency virus [HIV] disease: Secondary | ICD-10-CM

## 2016-01-12 LAB — LIPID PANEL
CHOL/HDL RATIO: 3.9 ratio (ref <=5.0)
Cholesterol: 141 mg/dL (ref 125–200)
HDL: 36 mg/dL — AB (ref >=40)
LDL CALC: 61 mg/dL (ref <130)
TRIGLYCERIDES: 222 mg/dL — AB (ref <150)
VLDL: 44 mg/dL — AB (ref <30)

## 2016-01-12 NOTE — Progress Notes (Signed)
HPI: Nathan Santana is a 55 y.o. male who presents to the [facility] today for follow-up of his HIV.  Nathan Santana saw me last month, and we switched him from [place] to [place].  Allergies: No Known Allergies  Past Medical History: Past Medical History:  Diagnosis Date  . Angina pectoris ([place])   . Anxiety   . Cancer ([place])   . Depression   . HIV infection ([place])   . Hypercholesteremia   . Hypertension   . Myocardial infarct, old   . Scrotal cyst     Social History: Social History   Social History  . Marital status: Single    Spouse name: N/A  . Number of children: 4  . Years of education: N/A   Social History Main Topics  . Smoking status: Current Some Day Smoker    Packs/day: 0.50    Types: Cigars, Cigarettes  . Smokeless tobacco: Never Used  . Alcohol use No     Comment: one beer weekly  . Drug use:     Types: Marijuana, Cocaine     Comment: Cocaine  . Sexual activity: No   Other Topics Concern  . Not on file   Social History Narrative  . No narrative on file    Current Regimen: Tivicay + Descovy  Labs: HIV 1 RNA Quant (copies/mL)  Date Value  10/25/2015 405 (H)  08/16/2015 39,100   CD4 T Cell Abs (/uL)  Date Value  10/25/2015 400  08/16/2015 330 (L)  02/08/2015 450    CrCl: CrCl cannot be calculated (Unknown ideal weight.).  Lipids:    Component Value Date/Time   CHOL 156 03/01/2015 1550   TRIG 60 03/01/2015 1550   HDL 51 03/01/2015 1550   CHOLHDL 3.1 03/01/2015 1550   VLDL 12 03/01/2015 1550   LDLCALC 93 03/01/2015 1550    Assessment: Nathan Santana is here today for follow-up after changing his medications.  He is doing quite well and very happy with the medication change.  He does tell me that his mood and depression is much better and he is sleeping much, much better with no nightmares.   We did talk last time about potentially switching him to [facility] if he would like to come off of his Remeron and Paxil.  I discussed all  the one pill once daily regimens with him and explained to him why he cannot take Triumeq and Odefsey.  He is interested in [place], but after discussion, we decided it is best to keep everything the same for now since he is doing so well.  I told him I would be hesitant to switch anything since we just switched last month and now he is feeling much better.  He agrees with my thoughts and wants to keep everything the same.  I made an appt with Dr. Megan Salon for next month on 11/28.  We will get labs today. I instructed him to call the clinic if he has any issues in the meantime.   Plans: - Continue Tivicay and Descovy - Continue Paxil and Remeron - HIV VL and CD4 today (as well as lipid panel that Dr. Megan Salon ordered) - F/u with Dr. Megan Salon 11/28 at [facility]. Westley Gambles, PharmD Infectious Diseases [place] for Infectious Disease 01/12/2016, 5:03 PM

## 2016-01-13 LAB — T-HELPER CELL (CD4) - (RCID CLINIC ONLY)
CD4 % Helper T Cell: 26 % — ABNORMAL LOW (ref 33–55)
CD4 T Cell Abs: 360 /uL — ABNORMAL LOW (ref 400–2700)

## 2016-01-14 LAB — HIV-1 RNA QUANT-NO REFLEX-BLD
HIV 1 RNA Quant: 42 copies/mL — ABNORMAL HIGH (ref <20)
HIV-1 RNA Quant, Log: 1.62 Log copies/mL — ABNORMAL HIGH (ref <1.30)

## 2016-02-15 ENCOUNTER — Ambulatory Visit: Payer: Self-pay | Admitting: Internal Medicine

## 2016-02-22 ENCOUNTER — Ambulatory Visit: Payer: Self-pay | Admitting: Internal Medicine

## 2016-03-01 ENCOUNTER — Ambulatory Visit (INDEPENDENT_AMBULATORY_CARE_PROVIDER_SITE_OTHER): Payer: Medicare Other | Admitting: Internal Medicine

## 2016-03-01 ENCOUNTER — Encounter: Payer: Self-pay | Admitting: Internal Medicine

## 2016-03-01 ENCOUNTER — Other Ambulatory Visit: Payer: Self-pay | Admitting: Internal Medicine

## 2016-03-01 VITALS — BP 167/105 | Temp 98.4°F | Ht 71.0 in | Wt 255.0 lb

## 2016-03-01 DIAGNOSIS — E784 Other hyperlipidemia: Secondary | ICD-10-CM

## 2016-03-01 DIAGNOSIS — M7989 Other specified soft tissue disorders: Secondary | ICD-10-CM | POA: Diagnosis not present

## 2016-03-01 DIAGNOSIS — B20 Human immunodeficiency virus [HIV] disease: Secondary | ICD-10-CM

## 2016-03-01 DIAGNOSIS — F1420 Cocaine dependence, uncomplicated: Secondary | ICD-10-CM

## 2016-03-01 DIAGNOSIS — I2510 Atherosclerotic heart disease of native coronary artery without angina pectoris: Secondary | ICD-10-CM | POA: Diagnosis not present

## 2016-03-01 DIAGNOSIS — Z21 Asymptomatic human immunodeficiency virus [HIV] infection status: Secondary | ICD-10-CM

## 2016-03-01 DIAGNOSIS — I10 Essential (primary) hypertension: Secondary | ICD-10-CM

## 2016-03-01 DIAGNOSIS — Z23 Encounter for immunization: Secondary | ICD-10-CM

## 2016-03-01 DIAGNOSIS — E7849 Other hyperlipidemia: Secondary | ICD-10-CM

## 2016-03-01 DIAGNOSIS — F32A Depression, unspecified: Secondary | ICD-10-CM

## 2016-03-01 DIAGNOSIS — F329 Major depressive disorder, single episode, unspecified: Secondary | ICD-10-CM

## 2016-03-01 NOTE — Progress Notes (Signed)
Patient Active Problem List   Diagnosis Date Noted  . HIV (human immunodeficiency virus infection) ([place]) 02/05/2015    Priority: High  . Right leg swelling 03/01/2016  . 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 Cigarette smoker 10/25/2015  . Cocaine use disorder, severe, dependence ([place]) 09/22/2015  . Cannabis use disorder, moderate, dependence ([place]) 09/22/2015  . MDD (major depressive disorder), recurrent severe, without psychosis ([place]) 09/21/2015  . Hyperlipidemia LDL goal <130 03/01/2015  . Orchalgia   . Testicular pain, right   . CAD (coronary artery disease) 02/05/2015  . Hypertension 02/05/2015  . Anxiety   . Depression   . Suicidal ideation     Patient's Medications  New Prescriptions   No medications on file  Previous Medications   AMLODIPINE (NORVASC) 10 MG TABLET    Take 1 tablet (10 mg total) by mouth daily.   ASPIRIN EC 81 MG TABLET    Take 1 tablet (81 mg total) by mouth daily.   CARVEDILOL (COREG) 25 MG TABLET    Take 1 tablet (25 mg total) by mouth 2 (two) times daily with a meal.   DOLUTEGRAVIR (TIVICAY) 50 MG TABLET    Take 1 tablet (50 mg total) by mouth daily.   EMTRICITABINE-TENOFOVIR AF (DESCOVY) 200-25 MG TABLET    Take 1 tablet by mouth daily.   LISINOPRIL (PRINIVIL,ZESTRIL) 10 MG TABLET    Take 1 tablet (10 mg total) by mouth daily.   MIRTAZAPINE (REMERON) 15 MG TABLET    Take 1 tablet (15 mg total) by mouth at bedtime.   NICOTINE POLACRILEX (NICORETTE) 2 MG GUM    Take 1 each (2 mg total) by mouth as needed for smoking cessation.   NITROGLYCERIN (NITROSTAT) 0.4 MG SL TABLET    Place 1 tablet (0.4 mg total) under the tongue every 5 (five) minutes as needed for chest pain.   PANTOPRAZOLE (PROTONIX) 40 MG TABLET    Take 1 tablet (40 mg total) by mouth daily.   PAROXETINE (PAXIL) 40 MG TABLET    Take 1 tablet (40 mg total) by mouth daily.   ROSUVASTATIN (CRESTOR) 20 MG TABLET    Take 1 tablet (20 mg total) by mouth daily.  Modified Medications   No  medications on file  Discontinued Medications   No medications on file    Subjective: Nathan Santana is in for his routine HIV follow-up visit. He is now living in [facility] and in an aftercare program called [facility].  He has been sober and drug free for 6 months. He is no longer feeling depressed. He continues on his Paxil and Remeron. He goes to school each day from 9 to 2:30. 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 He has had no problems tolerating the switch to Descovy and Tivicay. He uses a pillbox. He takes his medication each evening and has not missed a dose. For the last month he's had some swelling of his right leg. It gets worse during the day and better overnight. He has never had a blood clot that he knows of. He's had no recent injury.  Review of Systems: Review of Systems  Constitutional: Negative for chills, diaphoresis, fever, malaise/fatigue and weight loss.  HENT: Negative for sore throat.   Respiratory: Negative for cough, sputum production and shortness of breath.   Cardiovascular: Negative for chest pain.  Gastrointestinal: Negative for abdominal pain, diarrhea, heartburn, nausea and vomiting.  Genitourinary: Negative for dysuria and frequency.  Musculoskeletal: Negative for  joint pain and myalgias.       As noted in history of present illness.  Skin: Negative for rash.  Neurological: Negative for dizziness and headaches.  Psychiatric/Behavioral: Negative for depression and substance abuse. The patient is not nervous/anxious.     Past Medical History:  Diagnosis Date  . Angina pectoris ([name])   . Anxiety   . Cancer ([place])   . Depression   . HIV infection ([name])   . Hypercholesteremia   . Hypertension   . Myocardial infarct, old   . Scrotal cyst     Social History  Substance Use Topics  . Smoking status: Current Some Day Smoker    Packs/day: 0.50    Types: Cigars, Cigarettes  . Smokeless tobacco: Never Used  . Alcohol use No     Comment: one beer weekly     Family History  Problem Relation Age of Onset  . Hypertension Other   . CAD Sister     No Known Allergies  Objective:  Vitals:   03/01/16 1343 03/01/16 1344  BP:  (!) 167/105  Temp:  98.4 F (36.9 C)  TempSrc:  Oral  Weight: 255 lb (115.7 kg) 255 lb (115.7 kg)  Height:  5\' 11"  (1.803 m)   Body mass index is 35.57 kg/m.  Physical Exam  Constitutional: He is oriented to person, place, and time.  He is in good spirits.  HENT:  Mouth/Throat: No oropharyngeal exudate.  Eyes: Conjunctivae are normal.  Cardiovascular: Normal rate and regular rhythm.   No murmur heard. Pulmonary/Chest: Effort normal and breath sounds normal. He has no wheezes. He has no rales.  Abdominal: Soft. He exhibits no mass. There is no tenderness.  Musculoskeletal: Normal range of motion. He exhibits edema and tenderness.  He has 2+ pitting edema of his right leg from the knee down. There is no cellulitis. There are no palpable cords  Neurological: He is alert and oriented to person, place, and time.  Skin: No rash noted.  Psychiatric: Mood and affect normal.    Lab Results Lab Results  Component Value Date   WBC 5.8 09/20/2015   HGB 13.3 09/20/2015   HCT 39.6 09/20/2015   MCV 93.6 09/20/2015   PLT 168 09/20/2015    Lab Results  Component Value Date   CREATININE 0.93 09/23/2015   BUN 16 09/23/2015   NA 139 09/23/2015   K 3.6 09/23/2015   CL 106 09/23/2015   CO2 29 09/23/2015    Lab Results  Component Value Date   ALT 53 09/20/2015   AST 52 (H) 09/20/2015   ALKPHOS 92 09/20/2015   BILITOT 0.8 09/20/2015    Lab Results  Component Value Date   CHOL 141 01/12/2016   HDL 36 (L) 01/12/2016   LDLCALC 61 01/12/2016   TRIG 222 (H) 01/12/2016   CHOLHDL 3.9 01/12/2016   HIV 1 RNA Quant (copies/mL)  Date Value  01/12/2016 42 (H)  10/25/2015 405 (H)  08/16/2015 39,100   CD4 T Cell Abs (/uL)  Date Value  01/12/2016 360 (L)  10/25/2015 400  08/16/2015 330 (L)     Problem  List Items Addressed This Visit      High   HIV (human immunodeficiency virus infection) ([place])    His infection is under good control with his new antiretroviral regimen. He will continue in follow-up after lab work in 6 months.      Relevant Orders   T-helper cell (CD4)- ([facility] clinic only)   HIV 1  RNA quant-no reflex-bld   CBC   Comprehensive metabolic panel   RPR     Unprioritized   Cocaine use disorder, severe, dependence ([place])    I congratulated him on being drug free for 6 months. He plans to stay in his aftercare program for up to 1 year.      Depression    His depression is in remission now that he is sober.      Right leg swelling    I will check a Doppler ultrasound.      Relevant Orders   Ultrasound doppler venous legs bilat    Other Visit Diagnoses    Need for immunization against influenza    -  Primary   Relevant Orders   Flu Vaccine QUAD 36+ mos IM (Fluarix)        Michel Bickers, MD [facility] for [place] [phone] pager   [phone] cell 03/01/2016, 2:09 PM

## 2016-03-01 NOTE — Assessment & Plan Note (Signed)
I congratulated him on being drug free for 6 months. He plans to stay in his aftercare program for up to 1 year.

## 2016-03-01 NOTE — Assessment & Plan Note (Signed)
I will check a Doppler ultrasound.

## 2016-03-01 NOTE — Assessment & Plan Note (Signed)
His infection is under good control with his new antiretroviral regimen. He will continue in follow-up after lab work in 6 months.

## 2016-03-01 NOTE — Assessment & Plan Note (Signed)
His depression is in remission now that he is sober.

## 2016-03-02 ENCOUNTER — Telehealth: Payer: Self-pay

## 2016-03-02 NOTE — Telephone Encounter (Signed)
Called Pt to inform him of upcoming appt for ultrasound. Pt stated that he will not be able to make it, so I provided him with the contact information to reschedule at a time that works better for him. Pt agreed to comply

## 2016-03-03 ENCOUNTER — Ambulatory Visit (HOSPITAL_COMMUNITY): Admission: RE | Admit: 2016-03-03 | Payer: Medicare Other | Source: Ambulatory Visit

## 2016-03-06 ENCOUNTER — Other Ambulatory Visit: Payer: Self-pay | Admitting: *Deleted

## 2016-03-06 DIAGNOSIS — B20 Human immunodeficiency virus [HIV] disease: Secondary | ICD-10-CM

## 2016-03-06 MED ORDER — DOLUTEGRAVIR SODIUM 50 MG PO TABS: 50.0000 mg | ORAL_TABLET | Freq: Every day | ORAL | 5 refills | Status: DC

## 2016-03-06 MED ORDER — AMLODIPINE BESYLATE 10 MG PO TABS: 10.0000 mg | ORAL_TABLET | Freq: Every day | ORAL | 2 refills | Status: DC

## 2016-03-06 MED ORDER — PANTOPRAZOLE SODIUM 40 MG PO TBEC: 40.0000 mg | DELAYED_RELEASE_TABLET | Freq: Every day | ORAL | 2 refills | Status: DC

## 2016-03-06 MED ORDER — EMTRICITABINE-TENOFOVIR AF 200-25 MG PO TABS: 1.0000 | ORAL_TABLET | Freq: Every day | ORAL | 5 refills | Status: DC

## 2016-03-06 MED ORDER — LISINOPRIL 10 MG PO TABS: 10.0000 mg | ORAL_TABLET | Freq: Every day | ORAL | 2 refills | Status: DC

## 2016-06-18 ENCOUNTER — Other Ambulatory Visit: Payer: Self-pay | Admitting: Internal Medicine

## 2016-08-17 ENCOUNTER — Other Ambulatory Visit: Payer: Medicare Other

## 2016-08-17 ENCOUNTER — Telehealth: Payer: Self-pay | Admitting: *Deleted

## 2016-08-17 DIAGNOSIS — B20 Human immunodeficiency virus [HIV] disease: Secondary | ICD-10-CM

## 2016-08-17 DIAGNOSIS — I10 Essential (primary) hypertension: Secondary | ICD-10-CM

## 2016-08-17 DIAGNOSIS — I2510 Atherosclerotic heart disease of native coronary artery without angina pectoris: Secondary | ICD-10-CM

## 2016-08-17 LAB — COMPREHENSIVE METABOLIC PANEL
ALT: 11 U/L (ref 9–46)
AST: 14 U/L (ref 10–35)
Albumin: 3.9 g/dL (ref 3.6–5.1)
Alkaline Phosphatase: 146 U/L — ABNORMAL HIGH (ref 40–115)
BUN: 11 mg/dL (ref 7–25)
CALCIUM: 9 mg/dL (ref 8.6–10.3)
CO2: 26 mmol/L (ref 20–31)
Chloride: 106 mmol/L (ref 98–110)
Creat: 1.02 mg/dL (ref 0.70–1.33)
GLUCOSE: 126 mg/dL — AB (ref 65–99)
POTASSIUM: 3.9 mmol/L (ref 3.5–5.3)
Sodium: 139 mmol/L (ref 135–146)
Total Bilirubin: 0.5 mg/dL (ref 0.2–1.2)
Total Protein: 7 g/dL (ref 6.1–8.1)

## 2016-08-17 LAB — CBC
HEMATOCRIT: 38.3 % — AB (ref 38.5–50.0)
Hemoglobin: 12.5 g/dL — ABNORMAL LOW (ref 13.2–17.1)
MCH: 31.2 pg (ref 27.0–33.0)
MCHC: 32.6 g/dL (ref 32.0–36.0)
MCV: 95.5 fL (ref 80.0–100.0)
MPV: 11.4 fL (ref 7.5–12.5)
PLATELETS: 192 10*3/uL (ref 140–400)
RBC: 4.01 MIL/uL — ABNORMAL LOW (ref 4.20–5.80)
RDW: 12.7 % (ref 11.0–15.0)
WBC: 5.9 10*3/uL (ref 3.8–10.8)

## 2016-08-17 MED ORDER — CARVEDILOL 25 MG PO TABS: 25.0000 mg | ORAL_TABLET | Freq: Two times a day (BID) | ORAL | 1 refills | Status: AC

## 2016-08-17 MED ORDER — EMTRICITABINE-TENOFOVIR AF 200-25 MG PO TABS
1.0000 | ORAL_TABLET | Freq: Every day | ORAL | 5 refills | Status: DC
Start: 2016-08-17 — End: 2016-08-17

## 2016-08-17 MED ORDER — AMLODIPINE BESYLATE 10 MG PO TABS: 10.0000 mg | ORAL_TABLET | Freq: Every day | ORAL | 1 refills | Status: AC

## 2016-08-17 MED ORDER — PANTOPRAZOLE SODIUM 40 MG PO TBEC: 40.0000 mg | DELAYED_RELEASE_TABLET | Freq: Every day | ORAL | 1 refills | Status: AC

## 2016-08-17 MED ORDER — EMTRICITABINE-TENOFOVIR AF 200-25 MG PO TABS: 1.0000 | ORAL_TABLET | Freq: Every day | ORAL | 5 refills | Status: DC

## 2016-08-17 MED ORDER — DOLUTEGRAVIR SODIUM 50 MG PO TABS: 50.0000 mg | ORAL_TABLET | Freq: Every day | ORAL | 5 refills | Status: DC

## 2016-08-17 MED ORDER — AMLODIPINE BESYLATE 10 MG PO TABS: 10.0000 mg | ORAL_TABLET | Freq: Every day | ORAL | 1 refills | Status: DC

## 2016-08-17 MED ORDER — LISINOPRIL 10 MG PO TABS: 10.0000 mg | ORAL_TABLET | Freq: Every day | ORAL | 1 refills | Status: DC

## 2016-08-17 MED ORDER — CARVEDILOL 25 MG PO TABS: 25.0000 mg | ORAL_TABLET | Freq: Two times a day (BID) | ORAL | 1 refills | Status: DC

## 2016-08-17 MED ORDER — NITROGLYCERIN 0.4 MG SL SUBL: SUBLINGUAL_TABLET | SUBLINGUAL | 1 refills | Status: DC

## 2016-08-17 MED ORDER — NITROGLYCERIN 0.4 MG SL SUBL
SUBLINGUAL_TABLET | SUBLINGUAL | 1 refills | Status: AC
Start: 2016-08-17 — End: ?

## 2016-08-17 MED ORDER — LISINOPRIL 10 MG PO TABS: 10.0000 mg | ORAL_TABLET | Freq: Every day | ORAL | 1 refills | Status: AC

## 2016-08-17 MED ORDER — PANTOPRAZOLE SODIUM 40 MG PO TBEC: 40.0000 mg | DELAYED_RELEASE_TABLET | Freq: Every day | ORAL | 1 refills | Status: DC

## 2016-08-17 NOTE — Telephone Encounter (Signed)
Completed Dental Clinic Application.  In the process of obtaining PCP care at [facility].  Working on getting Medicaid worked out.  Needing refills in the meantime of all medication sent to [facility] at [address] in [facility].  This [facility].  The Patient needs to select another pharmacy in [facility].  RN left a message for the patient to call.  There is a [facility] across the street from the closed [place] which could be used.

## 2016-08-18 LAB — T-HELPER CELL (CD4) - (RCID CLINIC ONLY)
CD4 T CELL ABS: 440 /uL (ref 400–2700)
CD4 T CELL HELPER: 30 % — AB (ref 33–55)

## 2016-08-18 LAB — RPR

## 2016-08-19 LAB — HIV-1 RNA QUANT-NO REFLEX-BLD
HIV 1 RNA QUANT: NOT DETECTED {copies}/mL
HIV-1 RNA Quant, Log: <1.3 Log copies/mL

## 2016-08-31 ENCOUNTER — Ambulatory Visit (INDEPENDENT_AMBULATORY_CARE_PROVIDER_SITE_OTHER): Payer: Medicare Other | Admitting: Internal Medicine

## 2016-08-31 ENCOUNTER — Encounter: Payer: Self-pay | Admitting: Internal Medicine

## 2016-08-31 VITALS — BP 165/92 | HR 63 | Temp 98.0°F | Ht 71.0 in | Wt 234.0 lb

## 2016-08-31 DIAGNOSIS — I2510 Atherosclerotic heart disease of native coronary artery without angina pectoris: Secondary | ICD-10-CM

## 2016-08-31 DIAGNOSIS — F17210 Nicotine dependence, cigarettes, uncomplicated: Secondary | ICD-10-CM | POA: Diagnosis not present

## 2016-08-31 DIAGNOSIS — N5201 Erectile dysfunction due to arterial insufficiency: Secondary | ICD-10-CM | POA: Diagnosis not present

## 2016-08-31 DIAGNOSIS — F32A Depression, unspecified: Secondary | ICD-10-CM

## 2016-08-31 DIAGNOSIS — F329 Major depressive disorder, single episode, unspecified: Secondary | ICD-10-CM | POA: Diagnosis not present

## 2016-08-31 DIAGNOSIS — F1420 Cocaine dependence, uncomplicated: Secondary | ICD-10-CM | POA: Diagnosis not present

## 2016-08-31 DIAGNOSIS — Z21 Asymptomatic human immunodeficiency virus [HIV] infection status: Secondary | ICD-10-CM

## 2016-08-31 DIAGNOSIS — B20 Human immunodeficiency virus [HIV] disease: Secondary | ICD-10-CM | POA: Diagnosis present

## 2016-08-31 DIAGNOSIS — N529 Male erectile dysfunction, unspecified: Secondary | ICD-10-CM | POA: Insufficient documentation

## 2016-08-31 MED ORDER — EMTRICITABINE-TENOFOVIR AF 200-25 MG PO TABS: 1.0000 | ORAL_TABLET | Freq: Every day | ORAL | 5 refills | Status: AC

## 2016-08-31 MED ORDER — PAROXETINE HCL 40 MG PO TABS: 40.0000 mg | ORAL_TABLET | Freq: Every day | ORAL | 0 refills | Status: AC

## 2016-08-31 MED ORDER — SILDENAFIL CITRATE 20 MG PO TABS: ORAL_TABLET | ORAL | 3 refills | Status: AC

## 2016-08-31 MED ORDER — DOLUTEGRAVIR SODIUM 50 MG PO TABS: 50.0000 mg | ORAL_TABLET | Freq: Every day | ORAL | 5 refills | Status: AC

## 2016-08-31 MED ORDER — PAROXETINE HCL 40 MG PO TABS: 40.0000 mg | ORAL_TABLET | Freq: Every day | ORAL | 0 refills | Status: DC

## 2016-08-31 NOTE — Progress Notes (Signed)
Patient Active Problem List   Diagnosis Date Noted  . HIV (human immunodeficiency virus infection) ([place]) 02/05/2015    Priority: High  . Erectile dysfunction 08/31/2016  . Right leg swelling 03/01/2016  . 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 Cigarette smoker 10/25/2015  . Cocaine use disorder, severe, dependence ([place]) 09/22/2015  . Cannabis use disorder, moderate, dependence ([place]) 09/22/2015  . MDD (major depressive disorder), recurrent severe, without psychosis ([place]) 09/21/2015  . Hyperlipidemia LDL goal <130 03/01/2015  . Orchalgia   . Testicular pain, right   . CAD (coronary artery disease) 02/05/2015  . Hypertension 02/05/2015  . Anxiety   . Depression   . Suicidal ideation     Patient's Medications  New Prescriptions   SILDENAFIL (REVATIO) 20 MG TABLET    Take 1 by mouth as needed.  Previous Medications   AMLODIPINE (NORVASC) 10 MG TABLET    Take 1 tablet (10 mg total) by mouth daily.   ASPIRIN EC 81 MG TABLET    Take 1 tablet (81 mg total) by mouth daily.   CARVEDILOL (COREG) 25 MG TABLET    Take 1 tablet (25 mg total) by mouth 2 (two) times daily with a meal.   LISINOPRIL (PRINIVIL,ZESTRIL) 10 MG TABLET    Take 1 tablet (10 mg total) by mouth daily.   MIRTAZAPINE (REMERON) 15 MG TABLET    Take 1 tablet (15 mg total) by mouth at bedtime.   NICOTINE POLACRILEX (NICORETTE) 2 MG GUM    Take 1 each (2 mg total) by mouth as needed for smoking cessation.   NITROGLYCERIN (NITROSTAT) 0.4 MG SL TABLET    Place 1 tablet under the tongue if needed every 5 minutes for chest pain   PANTOPRAZOLE (PROTONIX) 40 MG TABLET    Take 1 tablet (40 mg total) by mouth daily.   ROSUVASTATIN (CRESTOR) 20 MG TABLET    Take 1 tablet (20 mg total) by mouth daily.  Modified Medications   Modified Medication Previous Medication   DOLUTEGRAVIR (TIVICAY) 50 MG TABLET dolutegravir (TIVICAY) 50 MG tablet      Take 1 tablet (50 mg total) by mouth daily.    Take 1 tablet (50 mg total) by mouth daily.     EMTRICITABINE-TENOFOVIR AF (DESCOVY) 200-25 MG TABLET emtricitabine-tenofovir AF (DESCOVY) 200-25 MG tablet      Take 1 tablet by mouth daily.    Take 1 tablet by mouth daily.   PAROXETINE (PAXIL) 40 MG TABLET PARoxetine (PAXIL) 40 MG tablet      Take 1 tablet (40 mg total) by mouth daily.    Take 1 tablet (40 mg total) by mouth daily.  Discontinued Medications   No medications on file    Subjective: Nathan Santana is in for his routine HIV follow-up visit. He takes Descovy and Tivicay each evening. He does not recall missing any doses. He is still in drug treatment aftercare at a center in [facility]. His 1 year anniversary of sobriety is coming up. He is having some problems with erectile dysfunction. He is dating a girlfriend who is aware of his infection. She has HIV negative. They have been sexually active but he always uses condoms.   Review of Systems: Review of Systems  Constitutional: Negative for chills, diaphoresis, fever, malaise/fatigue and weight loss.  HENT: Negative for sore throat.   Respiratory: Negative for cough, sputum production and shortness of breath.   Cardiovascular: Negative for chest pain.  Gastrointestinal: Negative for abdominal  pain, diarrhea, heartburn, nausea and vomiting.  Genitourinary: Negative for dysuria and frequency.  Musculoskeletal: Negative for joint pain and myalgias.  Skin: Negative for rash.  Neurological: Negative for dizziness and headaches.  Psychiatric/Behavioral: Negative for depression and substance abuse. The patient is not nervous/anxious.     Past Medical History:  Diagnosis Date  . Angina pectoris ([place])   . Anxiety   . Cancer ([place])   . Depression   . HIV infection ([place])   . Hypercholesteremia   . Hypertension   . Myocardial infarct, old   . Scrotal cyst     Social History  Substance Use Topics  . Smoking status: Current Some Day Smoker    Packs/day: 0.50    Types: Cigars, Cigarettes  . Smokeless tobacco: Never Used      Comment: ready to quit.  using gum.  Marland Kitchen Alcohol use No     Comment: one beer weekly    Family History  Problem Relation Age of Onset  . Hypertension Other   . CAD Sister     No Known Allergies  Objective:  Vitals:   08/31/16 0949  BP: (!) 165/92  Pulse: 63  Temp: 98 F (36.7 C)  TempSrc: Oral  Weight: 234 lb (106.1 kg)  Height: 5\' 11"  (1.803 m)   Body mass index is 32.64 kg/m.  Physical Exam  Constitutional: He is oriented to person, place, and time.  He is smiling and in good spirits.  HENT:  Mouth/Throat: No oropharyngeal exudate.  Some missing teeth.  Eyes: Conjunctivae are normal.  Cardiovascular: Normal rate and regular rhythm.   No murmur heard. Pulmonary/Chest: Breath sounds normal.  Abdominal: Soft. He exhibits no mass. There is no tenderness.  Musculoskeletal: Normal range of motion.  Neurological: He is alert and oriented to person, place, and time.  Skin: No rash noted.  Psychiatric: Mood and affect normal.    Lab Results Lab Results  Component Value Date   WBC 5.9 08/17/2016   HGB 12.5 (L) 08/17/2016   HCT 38.3 (L) 08/17/2016   MCV 95.5 08/17/2016   PLT 192 08/17/2016    Lab Results  Component Value Date   CREATININE 1.02 08/17/2016   BUN 11 08/17/2016   NA 139 08/17/2016   K 3.9 08/17/2016   CL 106 08/17/2016   CO2 26 08/17/2016    Lab Results  Component Value Date   ALT 11 08/17/2016   AST 14 08/17/2016   ALKPHOS 146 (H) 08/17/2016   BILITOT 0.5 08/17/2016    Lab Results  Component Value Date   CHOL 141 01/12/2016   HDL 36 (L) 01/12/2016   LDLCALC 61 01/12/2016   TRIG 222 (H) 01/12/2016   CHOLHDL 3.9 01/12/2016   HIV 1 RNA Quant (copies/mL)  Date Value  08/17/2016 <20 NOT DETECTED  01/12/2016 42 (H)  10/25/2015 405 (H)   CD4 T Cell Abs (/uL)  Date Value  08/17/2016 440  01/12/2016 360 (L)  10/25/2015 400     Problem List Items Addressed This Visit      High   HIV (human immunodeficiency virus infection) ([place])      His infection is now under excellent control. He will continue Descovy and Tivicay in follow-up after lab work in 6 months.      Relevant Medications   emtricitabine-tenofovir AF (DESCOVY) 200-25 MG tablet   dolutegravir (TIVICAY) 50 MG tablet   Other Relevant Orders   T-helper cell (CD4)- ([facility] clinic only)   HIV 1 RNA quant-no  reflex-bld     Unprioritized   Cigarette smoker    I encouraged him to consider cigarette cessation.      Cocaine use disorder, severe, dependence ([place])    I congratulated him on his continued sobriety.      Erectile dysfunction    I agreed to give him a trial of sildenafil. He was also given a supply of condoms.      Relevant Medications   sildenafil (REVATIO) 20 MG tablet    Other Visit Diagnoses    HIV disease ([place])       Relevant Medications   emtricitabine-tenofovir AF (DESCOVY) 200-25 MG tablet   dolutegravir (TIVICAY) 50 MG tablet        Michel Bickers, MD [facility] for Infectious [facility] [phone] pager   [phone] cell 08/31/2016, 10:21 AM

## 2016-08-31 NOTE — Assessment & Plan Note (Signed)
I agreed to give him a trial of sildenafil. He was also given a supply of condoms.

## 2016-08-31 NOTE — Assessment & Plan Note (Signed)
I encouraged him to consider cigarette cessation. 

## 2016-08-31 NOTE — Assessment & Plan Note (Signed)
His infection is now under excellent control. He will continue Descovy and Tivicay in follow-up after lab work in 6 months.

## 2016-08-31 NOTE — Assessment & Plan Note (Signed)
I congratulated him on his continued sobriety. 

## 2016-08-31 NOTE — Addendum Note (Signed)
Addended by: Landis Gandy on: 08/31/2016 11:04 AM   Modules accepted: Orders

## 2016-10-05 ENCOUNTER — Other Ambulatory Visit: Payer: Self-pay | Admitting: *Deleted

## 2016-10-05 DIAGNOSIS — I10 Essential (primary) hypertension: Secondary | ICD-10-CM

## 2016-10-06 ENCOUNTER — Other Ambulatory Visit: Payer: Self-pay | Admitting: Internal Medicine

## 2016-10-06 DIAGNOSIS — F32A Depression, unspecified: Secondary | ICD-10-CM

## 2016-10-06 DIAGNOSIS — F329 Major depressive disorder, single episode, unspecified: Secondary | ICD-10-CM

## 2016-10-09 IMAGING — DX DG ANKLE COMPLETE 3+V*L*
3 series · 3 of 3 positions shown · non-contrast
Comparison: None.

CLINICAL DATA: Pain and swelling in the left ankle.  No trauma.

EXAM:
LEFT ANKLE COMPLETE - 3+ VIEW

[ankle ap]
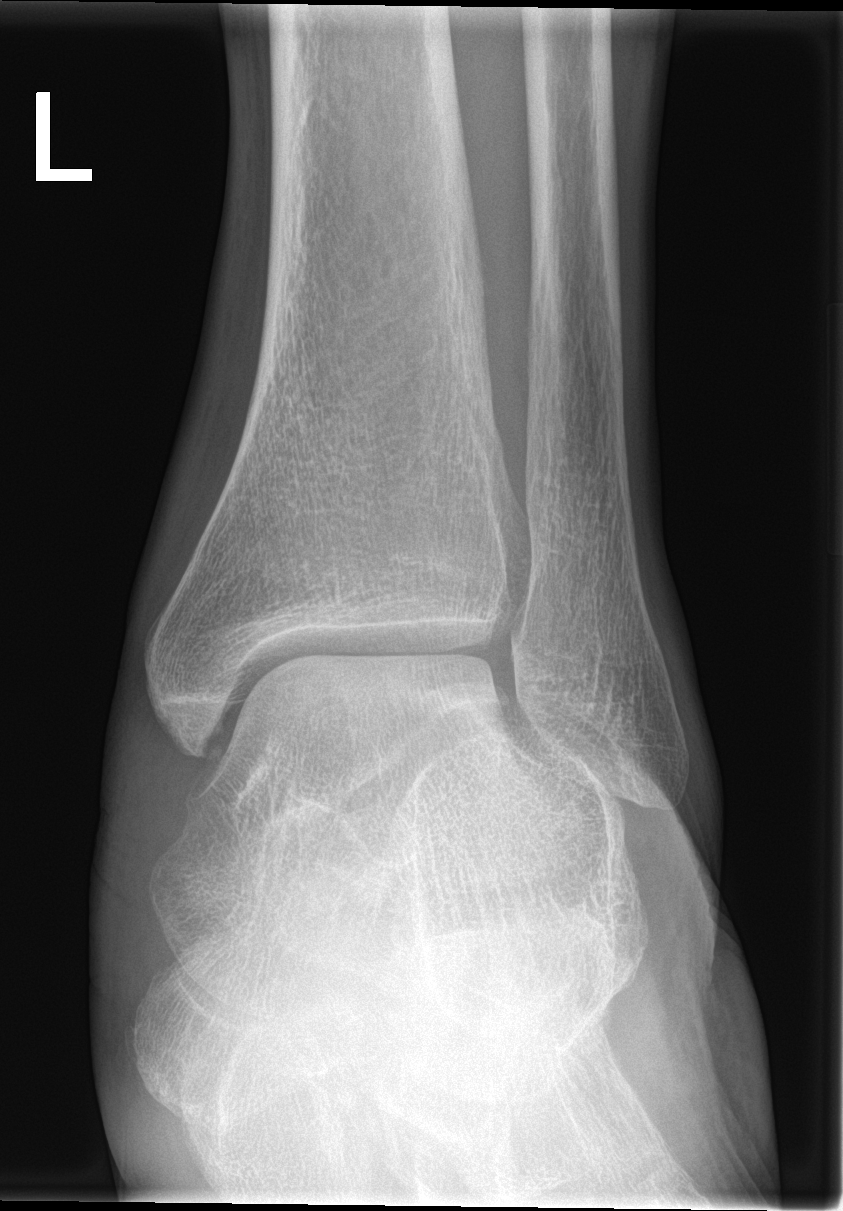

[ankle obl]
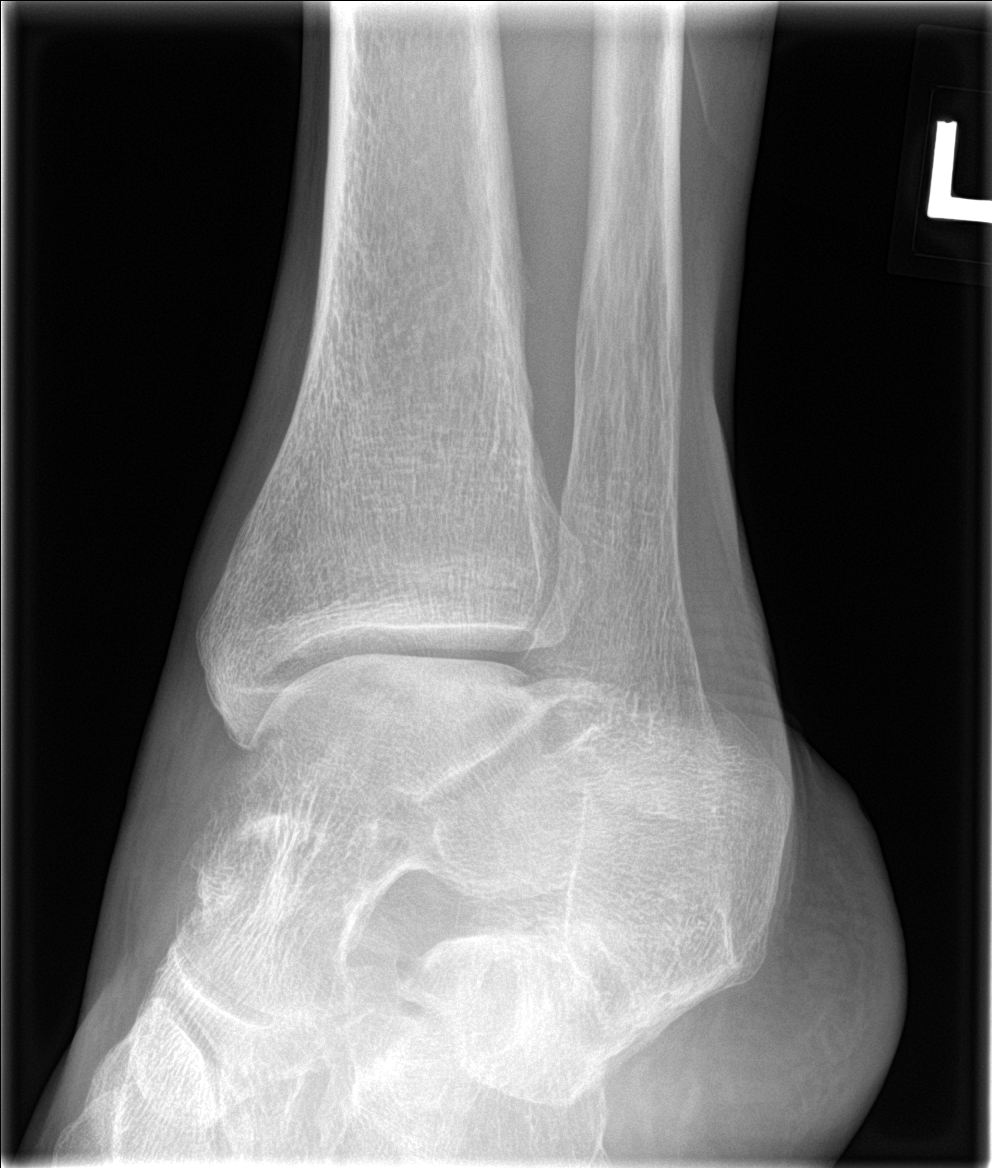

[ankle lat]
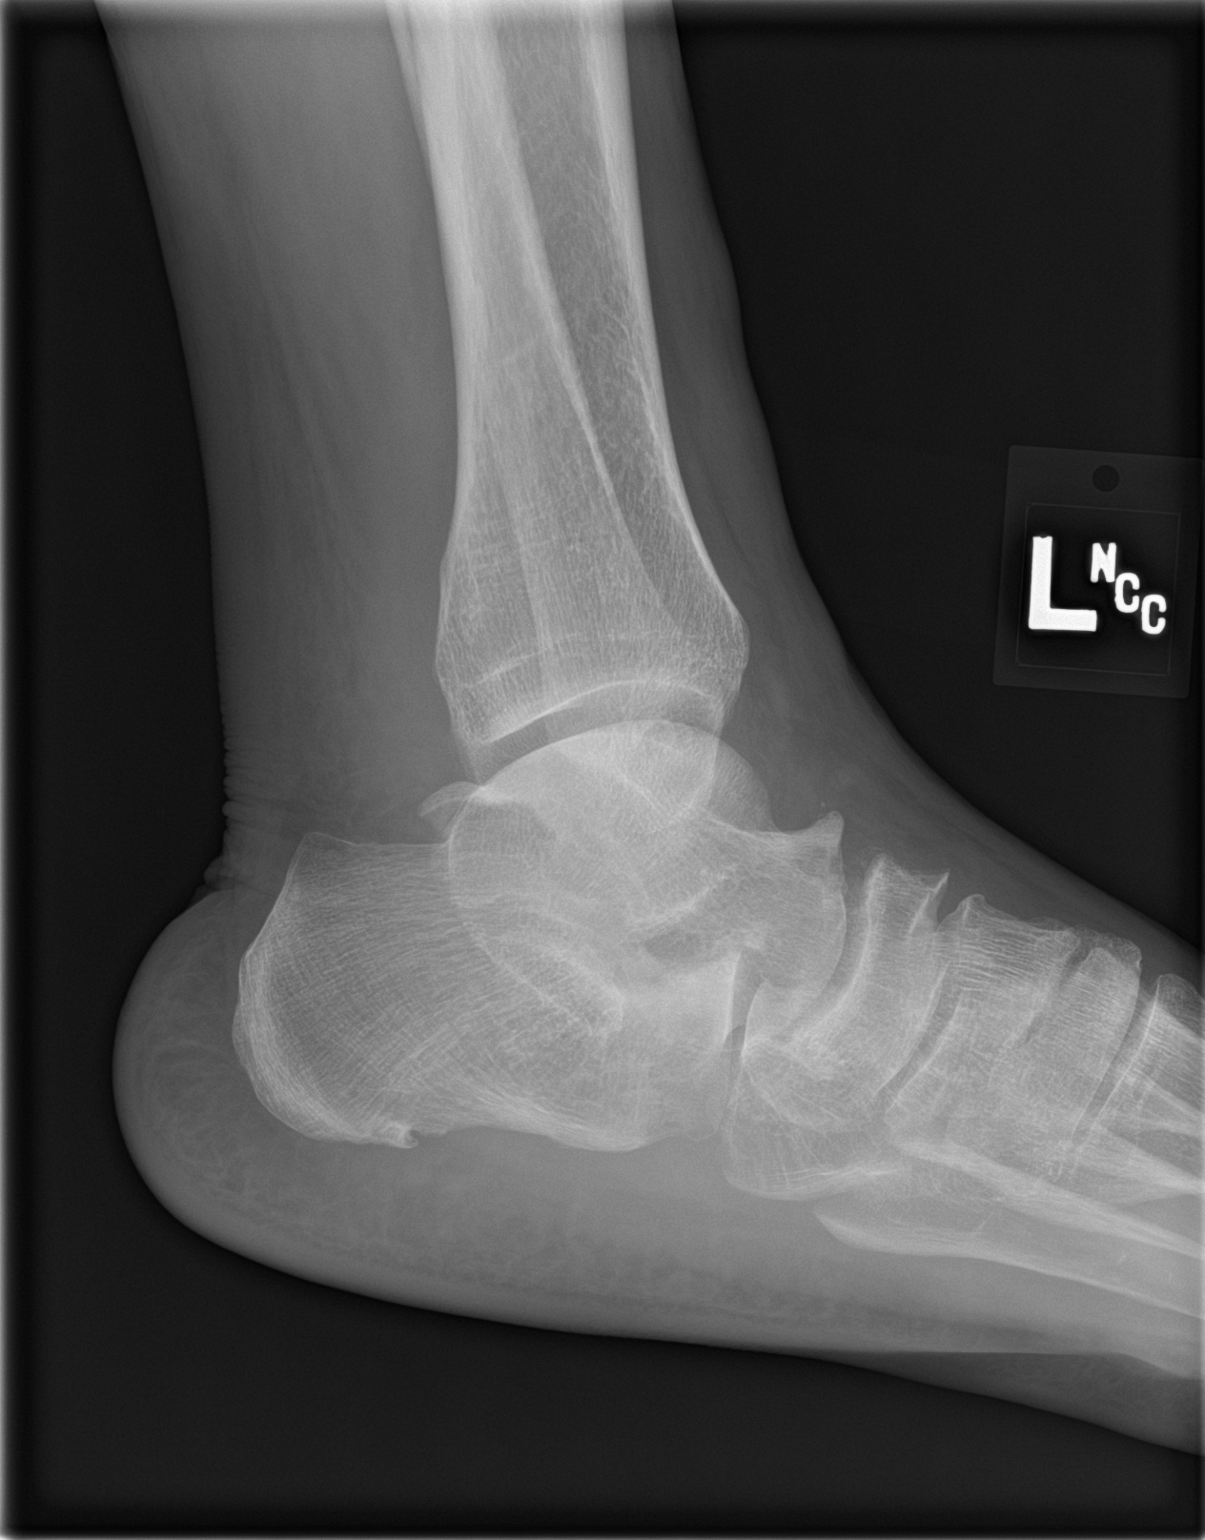

[3 of 3 positions shown; findings below may reference images not displayed]

FINDINGS: Soft tissue swelling. No fracture or dislocation. No cause for the
patient's symptoms identified.
IMPRESSION: Soft tissue swelling.  No underlying cause identified.

## 2016-11-07 IMAGING — DX DG CHEST 2V
2 series · 2 of 2 positions shown · non-contrast
Comparison: 09/06/2015

CLINICAL DATA: Chest pain starting [REDACTED], shortness of Breath

EXAM:
CHEST  2 VIEW

[chest pa]
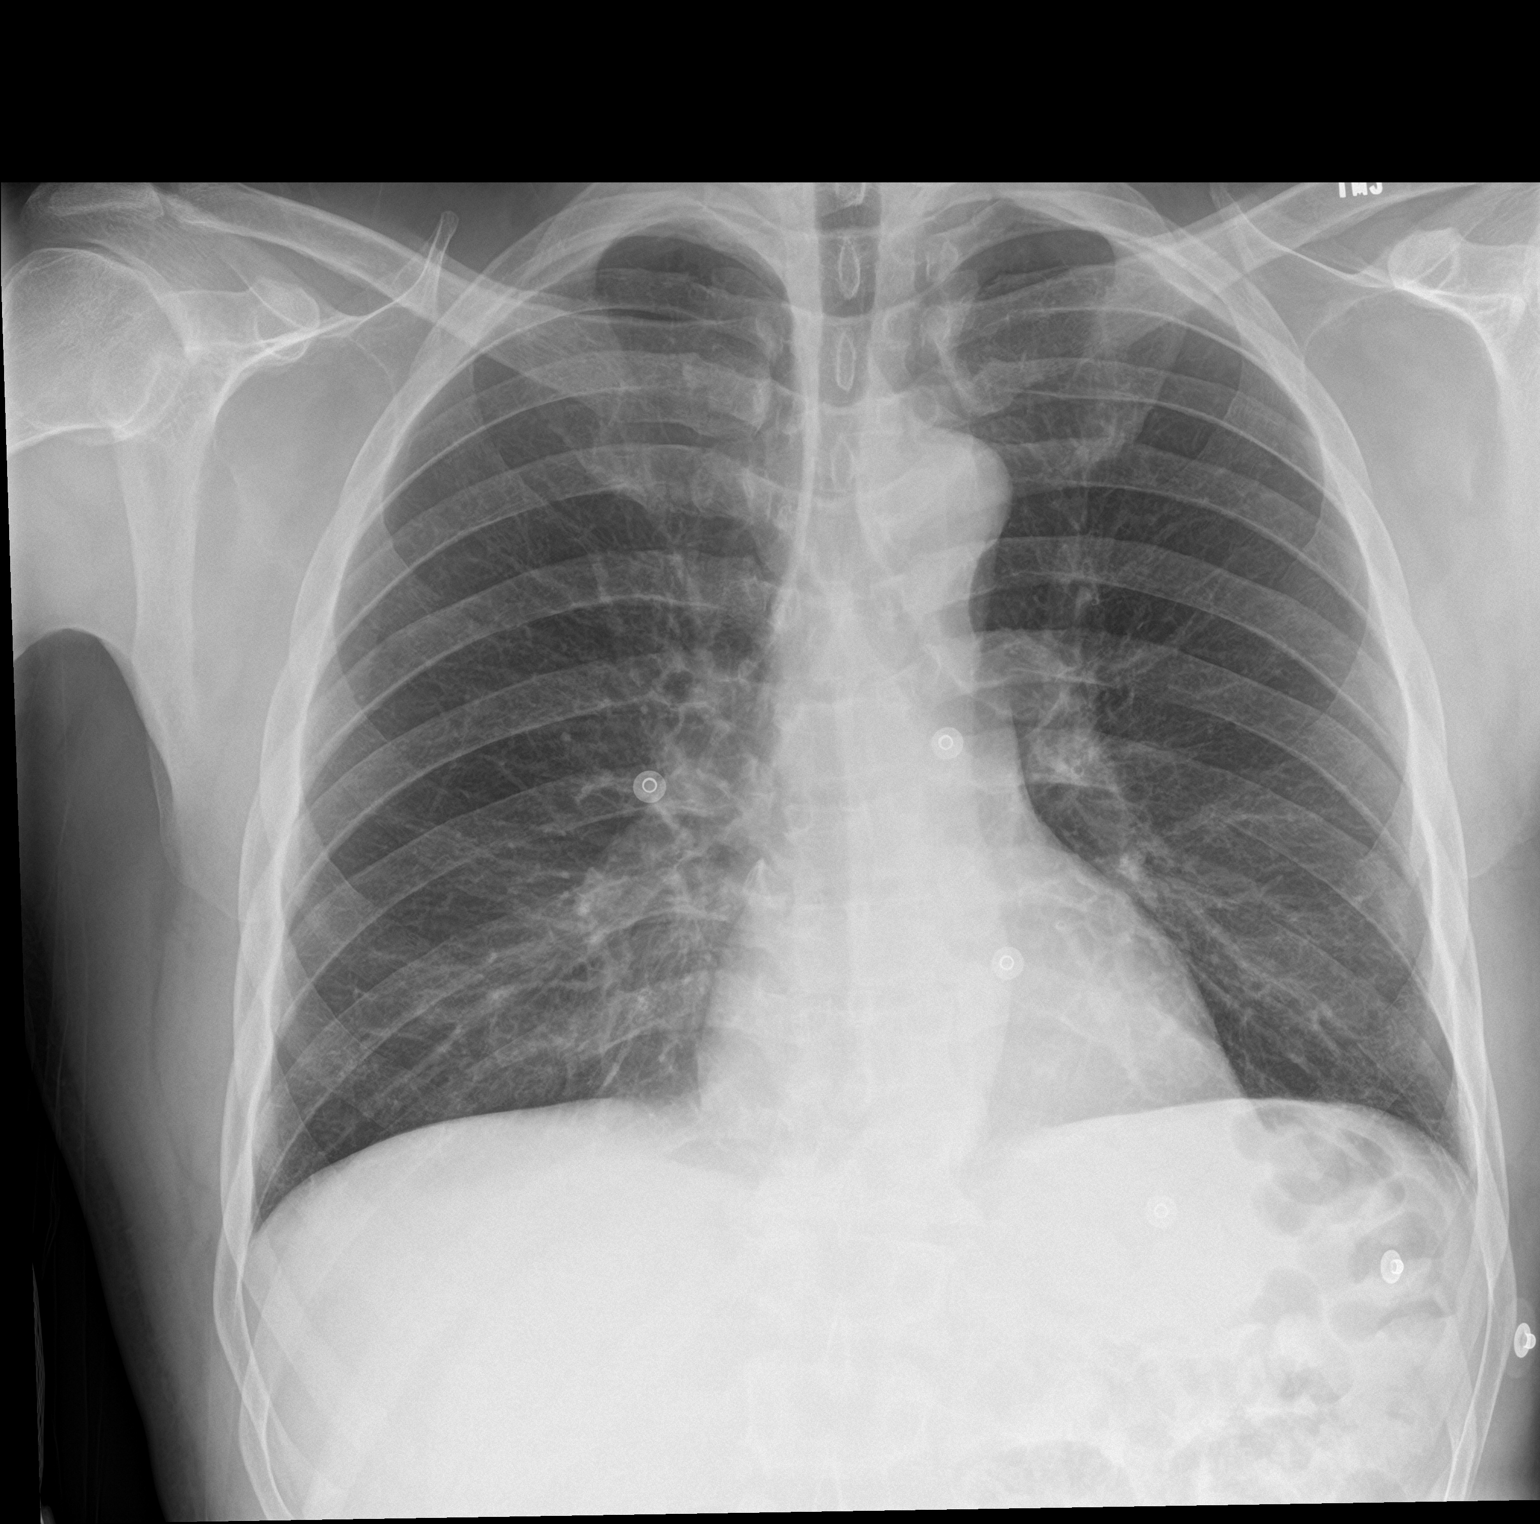

[chest lat]
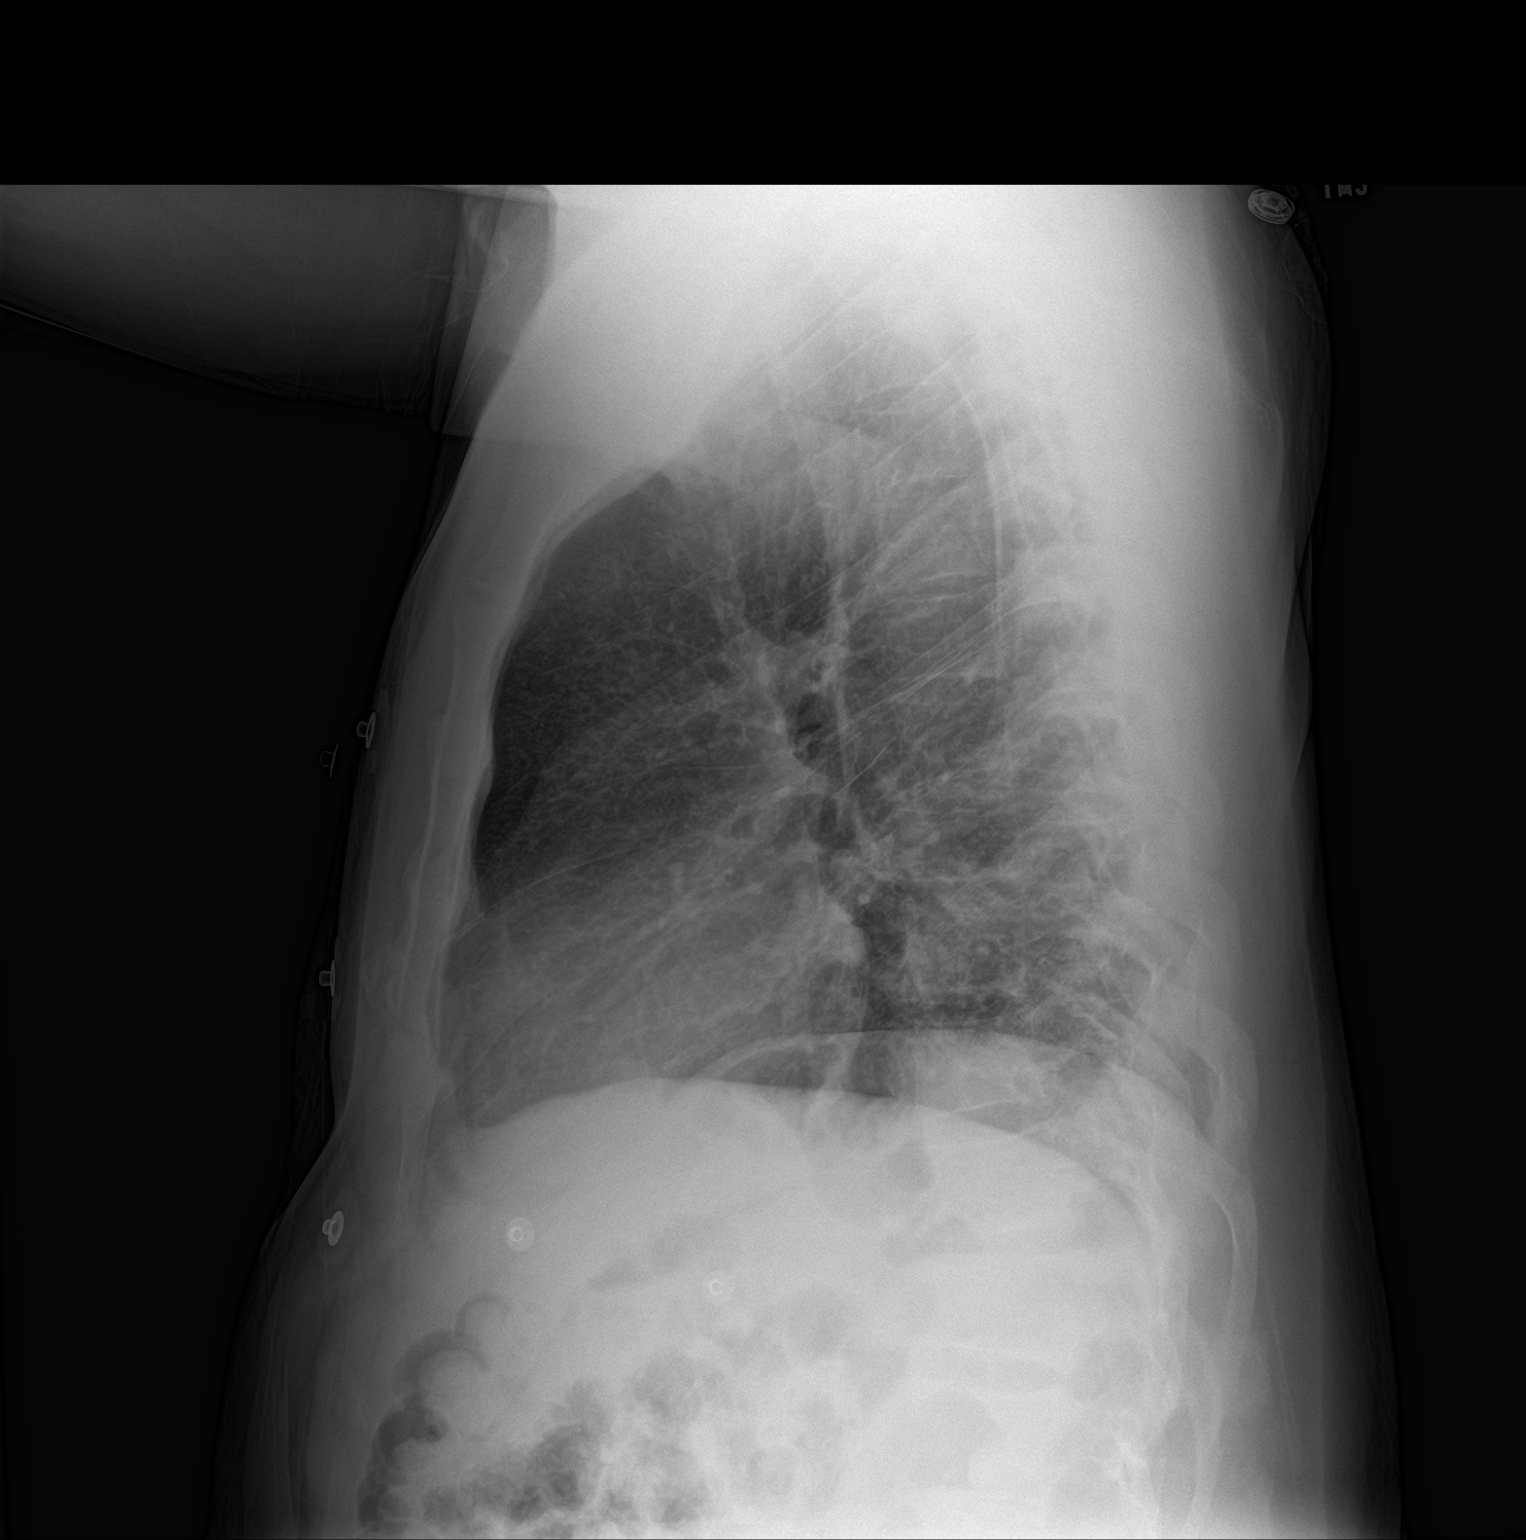

[2 of 2 positions shown; findings below may reference images not displayed]

FINDINGS: Cardiomediastinal silhouette is stable. No acute infiltrate or
pleural effusion. Bony thorax is unremarkable.
IMPRESSION: No active cardiopulmonary disease.

## 2016-11-16 ENCOUNTER — Other Ambulatory Visit: Payer: Self-pay | Admitting: Internal Medicine

## 2016-11-16 DIAGNOSIS — I10 Essential (primary) hypertension: Secondary | ICD-10-CM

## 2016-12-20 ENCOUNTER — Other Ambulatory Visit: Payer: Self-pay | Admitting: Internal Medicine

## 2016-12-20 DIAGNOSIS — I10 Essential (primary) hypertension: Secondary | ICD-10-CM

## 2016-12-22 ENCOUNTER — Other Ambulatory Visit: Payer: Self-pay | Admitting: Internal Medicine

## 2016-12-22 DIAGNOSIS — I2510 Atherosclerotic heart disease of native coronary artery without angina pectoris: Secondary | ICD-10-CM

## 2017-02-07 ENCOUNTER — Other Ambulatory Visit: Payer: Self-pay | Admitting: Internal Medicine

## 2017-02-07 ENCOUNTER — Telehealth: Payer: Self-pay | Admitting: *Deleted

## 2017-02-07 DIAGNOSIS — I10 Essential (primary) hypertension: Secondary | ICD-10-CM

## 2017-02-07 NOTE — Telephone Encounter (Signed)
Refill request for Carvedilol received. Per chart review, these were filled as a courtesy until patient established primary care. Unfortunately, patient was never seen/referral never completed for Primary care December 2017. RN deleted [facility] as PCP.  Patient has called to establish care at [facility], but remembers that he saw someone next to [facility] to establish care.  He will call there for an appointment.  Landis Gandy, RN

## 2017-02-20 ENCOUNTER — Other Ambulatory Visit: Payer: Self-pay

## 2017-03-06 ENCOUNTER — Ambulatory Visit: Payer: Self-pay | Admitting: Internal Medicine

## 2017-04-03 ENCOUNTER — Other Ambulatory Visit: Payer: Self-pay | Admitting: Internal Medicine

## 2017-04-03 DIAGNOSIS — I2510 Atherosclerotic heart disease of native coronary artery without angina pectoris: Secondary | ICD-10-CM

## 2017-06-15 ENCOUNTER — Other Ambulatory Visit: Payer: Self-pay | Admitting: Internal Medicine

## 2017-06-15 DIAGNOSIS — B20 Human immunodeficiency virus [HIV] disease: Secondary | ICD-10-CM

## 2017-09-09 ENCOUNTER — Other Ambulatory Visit: Payer: Self-pay | Admitting: Internal Medicine

## 2017-09-09 DIAGNOSIS — N5201 Erectile dysfunction due to arterial insufficiency: Secondary | ICD-10-CM

## 2017-09-09 DIAGNOSIS — B20 Human immunodeficiency virus [HIV] disease: Secondary | ICD-10-CM

## 2017-10-06 ENCOUNTER — Other Ambulatory Visit: Payer: Self-pay | Admitting: Internal Medicine

## 2017-10-06 DIAGNOSIS — N5201 Erectile dysfunction due to arterial insufficiency: Secondary | ICD-10-CM

## 2017-11-21 ENCOUNTER — Other Ambulatory Visit: Payer: Self-pay | Admitting: Internal Medicine

## 2017-11-21 DIAGNOSIS — N5201 Erectile dysfunction due to arterial insufficiency: Secondary | ICD-10-CM

## 2018-09-28 ENCOUNTER — Other Ambulatory Visit: Payer: Self-pay | Admitting: Internal Medicine

## 2018-09-28 DIAGNOSIS — B20 Human immunodeficiency virus [HIV] disease: Secondary | ICD-10-CM

## 2018-09-30 ENCOUNTER — Telehealth: Payer: Self-pay

## 2018-09-30 NOTE — Telephone Encounter (Signed)
Patient called back and advised he is now being seen at [facility] for his HIV.

## 2018-09-30 NOTE — Telephone Encounter (Signed)
Received refill request for Tivicay from pharmacy. Patient has not been in care since 2018, and will need to schedule an appointment for labs and office visit two weeks after. Left voicemail requesting patient call office back to schedule an appointment. Unable to refill medication at this time. [place]
